# Patient Record
Sex: Male | Born: 1960
Health system: Southern US, Community
[De-identification: ages and names within clinical notes are randomized; demographics above are authoritative.]

## PROBLEM LIST (undated history)

## (undated) ENCOUNTER — Encounter: Attending: Pharmacist | Primary: Pharmacist

## (undated) ENCOUNTER — Encounter: Attending: Family | Primary: Family

## (undated) ENCOUNTER — Encounter
Attending: Student in an Organized Health Care Education/Training Program | Primary: Student in an Organized Health Care Education/Training Program

## (undated) ENCOUNTER — Encounter

## (undated) ENCOUNTER — Ambulatory Visit

## (undated) ENCOUNTER — Telehealth: Attending: Ambulatory Care | Primary: Ambulatory Care

## (undated) ENCOUNTER — Ambulatory Visit
Payer: PRIVATE HEALTH INSURANCE | Attending: Student in an Organized Health Care Education/Training Program | Primary: Student in an Organized Health Care Education/Training Program

## (undated) ENCOUNTER — Non-Acute Institutional Stay: Payer: PRIVATE HEALTH INSURANCE

## (undated) ENCOUNTER — Ambulatory Visit: Attending: Pharmacist | Primary: Pharmacist

## (undated) ENCOUNTER — Encounter: Attending: Ambulatory Care | Primary: Ambulatory Care

## (undated) ENCOUNTER — Ambulatory Visit: Payer: PRIVATE HEALTH INSURANCE

## (undated) ENCOUNTER — Telehealth

## (undated) DIAGNOSIS — I10 Essential (primary) hypertension: Secondary | ICD-10-CM

## (undated) DIAGNOSIS — M1990 Unspecified osteoarthritis, unspecified site: Secondary | ICD-10-CM

## (undated) DIAGNOSIS — I2699 Other pulmonary embolism without acute cor pulmonale: Secondary | ICD-10-CM

## (undated) HISTORY — PX: BICEPS TENDON REPAIR: SHX566

## (undated) HISTORY — PX: SHOULDER SURGERY: SHX246

---

## 2008-04-14 ENCOUNTER — Ambulatory Visit (HOSPITAL_BASED_OUTPATIENT_CLINIC_OR_DEPARTMENT_OTHER): Admission: RE | Admit: 2008-04-14 | Discharge: 2008-04-14 | Payer: Self-pay | Admitting: Orthopedic Surgery

## 2010-08-07 NOTE — Op Note (Signed)
NAME:  Tyler Mcclure, WALN NO.:  [id number]   MEDICAL RECORD NO.:  [id number]          PATIENT TYPE:  AMB   LOCATION:  [place]                          FACILITY:  [facility]   PHYSICIAN:  [name], M.D. DATE OF BIRTH:  [birth year]   DATE OF PROCEDURE:  04/14/2008  DATE OF DISCHARGE:                               OPERATIVE REPORT   PREOPERATIVE DIAGNOSIS:  Right shoulder impingement, distal clavicle  osteolysis.   POSTOPERATIVE DIAGNOSES:  Right shoulder impingement, distal clavicle  osteolysis, with full-thickness tears of supraspinatus tendon.   PROCEDURE:  Right shoulder exam under anesthesia, arthroscopy, with  rotator cuff debridement and arthroscopically assisted repair, utilizing  fiber weave suture x3, Swivel-Lock anchors x3, bursectomy and  acromioplasty, coracoacromial ligament release, excision of distal  clavicle.   SURGEON:  [name], MD   ASSISTANT:  Alyson Locket. Ricard Dillon, [place], present throughout the entire case  necessary for timely completion of procedure.   ANESTHESIA:  General.   BLOOD LOSS:  Minimal.   SPECIMENS:  None.   CULTURES:  None.   COMPLICATIONS:  None.   DRESSING:  Soft compression with shoulder immobilizer.   PROCEDURE:  The patient was brought to the operating room and placed on  the operating table in supine position.  After adequate anesthesia had  been obtained, right shoulder was examined.  Some very mild lesions at  the extreme motion.  Full motion is stable at the shoulder after gentle  manipulation.  Placed in beach-chair position on the shoulder  positioner, prepped and draped in the usual sterile fashion.  Three  portals; anterior, posterior, and lateral.  Shoulder entered with blunt  obturator.  Arthroscope introduced.  The shoulder distended and  inspected.  Functional full-thickness tear, supraspinatus tendon entire  crescent region.  A little bit of thin remaining tissue nonfunctional.  Debrided back to healthy  tissue.  Tuberosity was roughened to bleeding  bone.  A little partial tearing of the biceps as it exited the shoulder  debrided.  Not enough to warrant resection or tenodesis.  Articular  cartilage, capsule ligamentous structures, biceps, anchor, and labrum  all otherwise intact.  Coming from above, type III acromion and reactive  bursitis.  Bursa resected.  Acromioplasty to a type I acromion with  shaver and high-speed bur.  CA ligament released with cautery.  Grade 4  changes with marked spurring in [facility] joint.  Periarticular spurs and  lateral centimeter of clavicle resected with shaver and high-speed bur,  the adequacy of decompression and clavicle excision confirmed viewing  from all portals.  Lateral portal opened a little bit further for a  large cannula.  A smaller cannula placed at the anterior portal.  The  cup was then captured with 3 horizontal mattress sutures with the  Scorpion device and fiber weave suture.  The sutures then all firmly  anchored down into the tuberosity with pre punching and then secured  fixation with Swivel-Lock.  The front and back anchored down with Swivel-  Lock and then the very anterior portion was brought over top as a double  row  and anchored down a little bit more laterally.  At completion, nice,  firm, water-tight closure of the cuff without undue tension when viewed  arthroscopically through full passive motion.  Instruments were removed.  Portals of shoulder and bursa injected with Marcaine.  Portals closed  with 4-0 nylon.  Sterile compressive dressing applied.  Shoulder  immobilizer applied.  Anesthesia reversed.  Brought to recovery room.  Tolerated the surgery well.  No complications.      [name], M.D.  Electronically Signed     DFM/MEDQ  D:  04/14/2008  T:  04/15/2008  Job:  [id number]

## 2010-09-05 ENCOUNTER — Other Ambulatory Visit: Payer: Self-pay | Admitting: Orthopedic Surgery

## 2010-09-05 DIAGNOSIS — M25521 Pain in right elbow: Secondary | ICD-10-CM

## 2010-09-12 ENCOUNTER — Ambulatory Visit
Admission: RE | Admit: 2010-09-12 | Discharge: 2010-09-12 | Disposition: A | Discharge status: Home / Self Care | Payer: BC Managed Care – PPO | Source: Ambulatory Visit | Attending: Orthopedic Surgery | Admitting: Orthopedic Surgery

## 2010-09-12 DIAGNOSIS — M25521 Pain in right elbow: Secondary | ICD-10-CM

## 2010-11-14 ENCOUNTER — Ambulatory Visit (HOSPITAL_BASED_OUTPATIENT_CLINIC_OR_DEPARTMENT_OTHER)
Admission: RE | Admit: 2010-11-14 | Discharge: 2010-11-14 | Disposition: A | Discharge status: Home / Self Care | Payer: BC Managed Care – PPO | Source: Ambulatory Visit | Attending: Orthopedic Surgery | Admitting: Orthopedic Surgery

## 2010-11-14 DIAGNOSIS — M5412 Radiculopathy, cervical region: Secondary | ICD-10-CM | POA: Insufficient documentation

## 2010-11-14 DIAGNOSIS — M249 Joint derangement, unspecified: Secondary | ICD-10-CM | POA: Insufficient documentation

## 2010-11-14 DIAGNOSIS — Z01812 Encounter for preprocedural laboratory examination: Secondary | ICD-10-CM | POA: Insufficient documentation

## 2010-11-14 LAB — POCT HEMOGLOBIN-HEMACUE: Hemoglobin: 16.7 g/dL (ref 13.0–17.0)

## 2010-11-20 NOTE — Op Note (Signed)
NAME:  Tyler Mcclure, Tyler Mcclure                ACCOUNT NO.:  [id number]  MEDICAL RECORD NO.:  [id number]  LOCATION:                                 FACILITY:  PHYSICIAN:  Sheral Apley. Burney Gauze, M.D.   DATE OF BIRTH:  DATE OF PROCEDURE:  11/14/2010 DATE OF DISCHARGE:                              OPERATIVE REPORT   PREOPERATIVE DIAGNOSIS:  Rupture right biceps tendon distally and right radial tunnel syndrome.  POSTOPERATIVE DIAGNOSIS:  Rupture right biceps tendon distally and right radial tunnel syndrome.  PROCEDURE:  Primary repair of right biceps rupture and radial tunnel release.  SURGEON:  Sheral Apley. Burney Gauze, MD  ASSISTANT:  None.  ANESTHESIA:  General.  TOURNIQUET TIME:  1 hour and 18 minutes.  COMPLICATION:  None.  DRAINS:  None.  DESCRIPTION:  The patient was taken to the operating suite.  After induction of adequate general anesthesia, the right upper extremity was prepped and draped in sterile fashion.  An Esmarch was used to exsanguinate the limb and the tourniquet was inflated to 250 mmHg.  At this point in time, incision was made transversely in the antecubital fossa and extended laterally down the palpable border of the brachioradialis and mobile wad.  Skin was incised.  An ulnarly based flap was elevated.  Dissection was carried down to the antecubital fossa.  Large branches of cephalic vein were carefully identified and retracted.  The biceps tendon was seen and carefully traced down to its insertion onto the bicipital tuberosity.  This included a ligation of several large vessels with 0 Vicryl ties.  Once this was done, it was evident that the biceps had almost completely ruptured with some pseudo tendon at the insertion site.  This was carefully dissected free.  The tendon was then brought out into the operative field.  It was gently placed in a traction and once this was done it was sutured with the Arthrex Endo button suture which was a FiberWire suture in a  locked running stitch.  After this was completed, the insertion site on the bicipital tuberosity was identified fluoroscopically and a unicortical wire was driven across followed by creation of a bone tunnel accordingly.  Once this was done, the biceps tendon was then debrided distally of the pseudo  tendon and the freshened stump was then inserted into this hole using Endo button technique under the appropriate tension.  After this was completed, the wound was thoroughly irrigated. Hemostasis achieved with bipolar cautery.  The lateral aspect of the wound was then developed into the level of brachioradialis which was carefully split down to the level of the radial tunnel.  The arcade of broach was identified and carefully retracted.  Small branches were ligated.  The fibrinous edge of the supinator muscle was carefully released, thus taking pressure off the radial nerve.  This wound was also irrigated.  It was then closed in layers of 2-0 undyed Vicryl and 4-0 Vicryl Rapide sutures on the skin.  Xeroform, 4x4s fluffs and a splint with the elbow flexed to 90 degrees and the forearm in supination was applied.  The patient tolerated the procedure well and went to the recovery room in stable fashion.  Sheral Apley Burney Gauze, M.D.     MAW/MEDQ  D:  11/14/2010  T:  11/14/2010  Job:  [id number]  Electronically Signed by Charlotte Crumb M.D. on 11/20/2010 09:36:47 AM

## 2011-12-12 LAB — HM HEPATITIS C SCREENING LAB: HM HEPATITIS C SCREENING: NEGATIVE

## 2012-02-24 ENCOUNTER — Ambulatory Visit: Payer: Self-pay | Admitting: Gastroenterology

## 2014-11-25 ENCOUNTER — Encounter: Payer: Self-pay | Admitting: Family Medicine

## 2014-11-25 ENCOUNTER — Ambulatory Visit
Admission: RE | Admit: 2014-11-25 | Discharge: 2014-11-25 | Disposition: A | Discharge status: Home / Self Care | Payer: BLUE CROSS/BLUE SHIELD | Source: Ambulatory Visit | Attending: Family Medicine | Admitting: Family Medicine

## 2014-11-25 ENCOUNTER — Ambulatory Visit (INDEPENDENT_AMBULATORY_CARE_PROVIDER_SITE_OTHER): Payer: BLUE CROSS/BLUE SHIELD | Admitting: Family Medicine

## 2014-11-25 ENCOUNTER — Telehealth: Payer: Self-pay

## 2014-11-25 VITALS — BP 122/90 | HR 72 | Temp 97.9°F | Resp 16 | Wt 217.0 lb

## 2014-11-25 DIAGNOSIS — M79674 Pain in right toe(s): Secondary | ICD-10-CM | POA: Diagnosis not present

## 2014-11-25 DIAGNOSIS — S46119A Strain of muscle, fascia and tendon of long head of biceps, unspecified arm, initial encounter: Secondary | ICD-10-CM | POA: Insufficient documentation

## 2014-11-25 DIAGNOSIS — C4492 Squamous cell carcinoma of skin, unspecified: Secondary | ICD-10-CM | POA: Insufficient documentation

## 2014-11-25 DIAGNOSIS — M19071 Primary osteoarthritis, right ankle and foot: Secondary | ICD-10-CM | POA: Diagnosis not present

## 2014-11-25 DIAGNOSIS — R195 Other fecal abnormalities: Secondary | ICD-10-CM | POA: Insufficient documentation

## 2014-11-25 MED ORDER — NAPROXEN 500 MG PO TABS: 500.0000 mg | ORAL_TABLET | Freq: Two times a day (BID) | ORAL | Status: DC

## 2014-11-25 NOTE — Telephone Encounter (Signed)
-----   Message from Margo Common, [place] sent at 11/25/2014  2:34 PM EDT ----- Awaiting lab reports to rule out any gout. X-ray shows osteoarthritis of that great toe joint. The Naproxen 500 mg BID should help it settle down.

## 2014-11-25 NOTE — Telephone Encounter (Signed)
Patient advised as directed below.  Thanks,  -Joelle Flessner 

## 2014-11-25 NOTE — Progress Notes (Signed)
Patient ID: AKITO BOOMHOWER, male   DOB: Mar 07, 1961, 54 y.o.   MRN: [id number]   Patient: Tyler Mcclure Male    DOB: 04-22-1960   54 y.o.   MRN: [id number] Visit Date: 11/25/2014  Today's Provider: Vernie Murders, PA   Chief Complaint  Patient presents with  . Toe Pain    right great toe X 2 days   Subjective:    Toe Pain  The incident occurred 2 days ago. There was no injury mechanism. The pain is present in the right toes. The pain has been constant since onset. Associated symptoms include a loss of motion. He reports no foreign bodies present. He has tried nothing for the symptoms. The treatment provided no relief.   Patient Active Problem List   Diagnosis Date Noted  . Rupture of tendon of biceps, long head 11/25/2014  . Fecal occult blood test positive 11/25/2014  . Squamous cell carcinoma of skin 11/25/2014   Past Surgical History  Procedure Laterality Date  . Biceps tendon repair    . Shoulder surgery     Family History  Problem Relation Age of Onset  . Breast cancer Mother   . Hyperlipidemia Mother   . Pancreatic cancer Father   . Healthy Sister   . Healthy Daughter   . Healthy Son    Allergies  Allergen Reactions  . Mercury   . Penicillins      Previous Medications   CHOLECALCIFEROL (VITAMIN D) 2000 UNITS CAPS    Take 1 capsule by mouth daily.   HYDROCORTISONE (ANUSOL-HC) 2.5 % RECTAL CREAM    Place 1 application rectally at bedtime.   MULTIPLE VITAMIN PO    Take 1 tablet by mouth daily.   OMEGA-3 FATTY ACIDS (FISH OIL BURP-LESS) 1200 MG CAPS    Take 2 capsules by mouth daily.   VITAMIN E 400 UNIT CAPSULE    Take 1 capsule by mouth daily.    Review of Systems  Constitutional: Negative.   HENT: Negative.   Respiratory: Negative.   Musculoskeletal: Positive for arthralgias.       Right great toe.   Neurological: Negative.     Social History  Substance Use Topics  . Smoking status: Never Smoker   . Smokeless tobacco: Current User  . Alcohol Use: 0.0  oz/week    0 Standard drinks or equivalent per week     Comment: OCCASIONALLY   Objective:   BP 122/90 mmHg  Pulse 72  Temp(Src) 97.9 F (36.6 C) (Oral)  Resp 16  Wt 217 lb (98.431 kg)  Physical Exam  Constitutional: He is oriented to person, place, and time. He appears well-developed and well-nourished. No distress.  HENT:  Head: Normocephalic and atraumatic.  Right Ear: Hearing normal.  Left Ear: Hearing normal.  Nose: Nose normal.  Eyes: Conjunctivae and lids are normal. Right eye exhibits no discharge. Left eye exhibits no discharge. No scleral icterus.  Pulmonary/Chest: Effort normal. No respiratory distress.  Musculoskeletal:  Stiff and very tender right great toe. Pain with passive ROM but no redness or swelling. Pulses are 2+.  Neurological: He is alert and oriented to person, place, and time.  Skin: Skin is intact. No lesion and no rash noted.  Psychiatric: He has a normal mood and affect. His speech is normal and behavior is normal. Thought content normal.      Assessment & Plan:     1. Toe pain, right Onset 2 days ago without known injury. No fever, redness or  swelling. Some history of past arthralgias. No known family history of gout. Will treat with NSAID, get labs and x-ray evaluation to rule out gout versus degenerative arthritis. Recheck pending reports. - naproxen (NAPROSYN) 500 MG tablet; Take 1 tablet (500 mg total) by mouth 2 (two) times daily with a meal.  Dispense: 60 tablet; Refill: 0 - Uric acid - CBC with Differential/Platelet - DG Foot Complete Right

## 2014-11-26 LAB — CBC WITH DIFFERENTIAL/PLATELET
BASOS ABS: 0 10*3/uL (ref 0.0–0.2)
Basos: 1 %
EOS (ABSOLUTE): 0.1 10*3/uL (ref 0.0–0.4)
Eos: 2 %
HEMOGLOBIN: 16.4 g/dL (ref 12.6–17.7)
Hematocrit: 48 % (ref 37.5–51.0)
IMMATURE GRANS (ABS): 0 10*3/uL (ref 0.0–0.1)
Immature Granulocytes: 0 %
LYMPHS: 40 %
Lymphocytes Absolute: 2.5 10*3/uL (ref 0.7–3.1)
MCH: 30.1 pg (ref 26.6–33.0)
MCHC: 34.2 g/dL (ref 31.5–35.7)
MCV: 88 fL (ref 79–97)
MONOCYTES: 11 %
Monocytes Absolute: 0.7 10*3/uL (ref 0.1–0.9)
NEUTROS PCT: 46 %
Neutrophils Absolute: 2.9 10*3/uL (ref 1.4–7.0)
PLATELETS: 229 10*3/uL (ref 150–379)
RBC: 5.44 x10E6/uL (ref 4.14–5.80)
RDW: 13.1 % (ref 12.3–15.4)
WBC: 6.3 10*3/uL (ref 3.4–10.8)

## 2014-11-26 LAB — URIC ACID: URIC ACID: 7.1 mg/dL (ref 3.7–8.6)

## 2014-11-29 ENCOUNTER — Telehealth: Payer: Self-pay

## 2014-11-29 NOTE — Telephone Encounter (Signed)
-----   Message from Margo Common, [place] sent at 11/28/2014 11:25 AM EDT ----- CBC and uric acid level essentially normal. X-ray confirm osteoarthritis in the great toe joints. If persistent problem despite use of Naproxen, should consider referral to podiatrist in 2-3 weeks.

## 2014-11-29 NOTE — Telephone Encounter (Signed)
Patient advised as directed below.  Thanks,  -Mindy Behnken 

## 2014-12-21 ENCOUNTER — Ambulatory Visit (INDEPENDENT_AMBULATORY_CARE_PROVIDER_SITE_OTHER): Payer: BLUE CROSS/BLUE SHIELD | Admitting: Family Medicine

## 2014-12-21 ENCOUNTER — Encounter: Payer: Self-pay | Admitting: Family Medicine

## 2014-12-21 VITALS — BP 112/82 | HR 73 | Temp 98.1°F | Resp 18 | Ht 70.0 in | Wt 210.4 lb

## 2014-12-21 DIAGNOSIS — Z0000 Encounter for general adult medical examination without abnormal findings: Secondary | ICD-10-CM | POA: Diagnosis not present

## 2014-12-21 NOTE — Progress Notes (Signed)
Patient ID: Tyler Mcclure, male   DOB: [birth year], 54 y.o.   MRN: [id number] Patient: Tyler Mcclure, Male    DOB: [birth year], 54 y.o.   MRN: [id number] Visit Date: 12/21/2014  Today's Provider: [name], MD   Chief Complaint  Patient presents with  . Annual Exam   Subjective:  Tyler Mcclure is a 54 y.o. male who presents today for health maintenance and complete physical. He feels well. He reports exercising daily . He reports he is sleeping well (7-8 hours a night).  Last: EKG- 12/16/2013  Colonoscopy- 02/24/2012  Tdap-11/24/2008  PSA- 12/16/2013 (0.7)   Review of Systems  Constitutional: Negative.   HENT: Negative.   Eyes: Negative.   Respiratory: Negative.   Cardiovascular: Negative.   Gastrointestinal: Negative.   Endocrine: Negative.   Genitourinary: Negative.   Musculoskeletal: Negative.   Skin: Negative.   Allergic/Immunologic: Negative.   Neurological: Negative.   Hematological: Negative.   Psychiatric/Behavioral: Negative.     Social History   Social History  . Marital Status: Married    Spouse Name: N/A  . Number of Children: N/A  . Years of Education: N/A   Occupational History  . Not on file.   Social History Main Topics  . Smoking status: Never Smoker   . Smokeless tobacco: Current User  . Alcohol Use: 0.0 oz/week    0 Standard drinks or equivalent per week     Comment: OCCASIONALLY  . Drug Use: No  . Sexual Activity: Not on file   Other Topics Concern  . Not on file   Social History Narrative    Patient Active Problem List   Diagnosis Date Noted  . Rupture of tendon of biceps, long head 11/25/2014  . Fecal occult blood test positive 11/25/2014  . Squamous cell carcinoma of skin 11/25/2014    Past Surgical History  Procedure Laterality Date  . Biceps tendon repair    . Shoulder surgery      His family history includes Breast cancer in his mother; Healthy in his daughter, sister, and son; Hyperlipidemia in his mother;  Pancreatic cancer in his father.    Outpatient Prescriptions Prior to Visit  Medication Sig Dispense Refill  . Cholecalciferol (VITAMIN D) 2000 UNITS CAPS Take 1 capsule by mouth daily.    . hydrocortisone (ANUSOL-HC) 2.5 % rectal cream Place 1 application rectally at bedtime.    . MULTIPLE VITAMIN PO Take 1 tablet by mouth daily.    . naproxen (NAPROSYN) 500 MG tablet Take 1 tablet (500 mg total) by mouth 2 (two) times daily with a meal. 60 tablet 0  . Omega-3 Fatty Acids (FISH OIL BURP-LESS) 1200 MG CAPS Take 2 capsules by mouth daily.    . vitamin E 400 UNIT capsule Take 1 capsule by mouth daily.     No facility-administered medications prior to visit.    Patient Care Team: Jerrol Banana., MD as PCP - General (Family Medicine)     Objective:   Vitals:  Filed Vitals:   12/21/14 0827  BP: 112/82  Pulse: 73  Temp: 98.1 F (36.7 C)  TempSrc: Oral  Resp: 18  Height: 5' 10"  (1.778 m)  Weight: 210 lb 6.4 oz (95.437 kg)  SpO2: 97%    Physical Exam  Constitutional: He is oriented to person, place, and time. He appears well-developed and well-nourished.  HENT:  Head: Normocephalic and atraumatic.  Right Ear: External ear normal.  Left Ear: External ear normal.  Nose: Nose  normal.  Mouth/Throat: Oropharynx is clear and moist.  Eyes: Conjunctivae and EOM are normal. Pupils are equal, round, and reactive to light.  Neck: Normal range of motion. Neck supple.  Cardiovascular: Normal rate, regular rhythm, normal heart sounds and intact distal pulses.   Pulmonary/Chest: Effort normal and breath sounds normal.  Abdominal: Soft. Bowel sounds are normal.  Genitourinary: Rectum normal, prostate normal and penis normal.  Musculoskeletal: Normal range of motion.  Neurological: He is alert and oriented to person, place, and time.  Skin: Skin is warm and dry.  Psychiatric: He has a normal mood and affect. His behavior is normal. Judgment and thought content normal.      Depression Screen No flowsheet data found.    Assessment & Plan:     Routine Health Maintenance and Physical Exam  Exercise Activities and Dietary recommendations Goals    None      Immunization History  Administered Date(s) Administered  . Tdap 11/24/2008    Health Maintenance  Topic Date Due  . Hepatitis C Screening  Feb 20, 1961  . HIV Screening  04/08/1976  . COLONOSCOPY  04/09/2011  . INFLUENZA VACCINE  10/24/2014  . TETANUS/TDAP  11/25/2018      Discussed health benefits of physical activity, and encouraged him to engage in regular exercise appropriate for his age and condition.    ------------------------------------------------------------------------------------------------------------

## 2014-12-22 LAB — CBC WITH DIFFERENTIAL/PLATELET
Basophils Absolute: 0 10*3/uL (ref 0.0–0.2)
Basos: 1 %
EOS (ABSOLUTE): 0.1 10*3/uL (ref 0.0–0.4)
Eos: 2 %
Hematocrit: 47.9 % (ref 37.5–51.0)
Hemoglobin: 16.6 g/dL (ref 12.6–17.7)
IMMATURE GRANULOCYTES: 0 %
Immature Grans (Abs): 0 10*3/uL (ref 0.0–0.1)
LYMPHS ABS: 1.8 10*3/uL (ref 0.7–3.1)
Lymphs: 39 %
MCH: 30.5 pg (ref 26.6–33.0)
MCHC: 34.7 g/dL (ref 31.5–35.7)
MCV: 88 fL (ref 79–97)
MONOS ABS: 0.5 10*3/uL (ref 0.1–0.9)
Monocytes: 10 %
NEUTROS PCT: 48 %
Neutrophils Absolute: 2.2 10*3/uL (ref 1.4–7.0)
PLATELETS: 215 10*3/uL (ref 150–379)
RBC: 5.45 x10E6/uL (ref 4.14–5.80)
RDW: 12.9 % (ref 12.3–15.4)
WBC: 4.7 10*3/uL (ref 3.4–10.8)

## 2014-12-22 LAB — COMPREHENSIVE METABOLIC PANEL
A/G RATIO: 2.1 (ref 1.1–2.5)
ALT: 50 IU/L — AB (ref 0–44)
AST: 28 IU/L (ref 0–40)
Albumin: 5 g/dL (ref 3.5–5.5)
Alkaline Phosphatase: 41 IU/L (ref 39–117)
BUN/Creatinine Ratio: 14 (ref 9–20)
BUN: 12 mg/dL (ref 6–24)
Bilirubin Total: 1.6 mg/dL — ABNORMAL HIGH (ref 0.0–1.2)
CALCIUM: 9.6 mg/dL (ref 8.7–10.2)
CO2: 23 mmol/L (ref 18–29)
CREATININE: 0.86 mg/dL (ref 0.76–1.27)
Chloride: 102 mmol/L (ref 97–108)
GFR, EST AFRICAN AMERICAN: 114 mL/min/{1.73_m2} (ref >59)
GFR, EST NON AFRICAN AMERICAN: 99 mL/min/{1.73_m2} (ref >59)
Globulin, Total: 2.4 g/dL (ref 1.5–4.5)
Glucose: 89 mg/dL (ref 65–99)
POTASSIUM: 4.3 mmol/L (ref 3.5–5.2)
Sodium: 142 mmol/L (ref 134–144)
TOTAL PROTEIN: 7.4 g/dL (ref 6.0–8.5)

## 2014-12-22 LAB — LIPID PANEL
CHOL/HDL RATIO: 4.3 ratio (ref 0.0–5.0)
CHOLESTEROL TOTAL: 215 mg/dL — AB (ref 100–199)
HDL: 50 mg/dL (ref >39)
LDL CALC: 121 mg/dL — AB (ref 0–99)
TRIGLYCERIDES: 219 mg/dL — AB (ref 0–149)
VLDL Cholesterol Cal: 44 mg/dL — ABNORMAL HIGH (ref 5–40)

## 2014-12-22 LAB — TSH: TSH: 3.12 u[IU]/mL (ref 0.450–4.500)

## 2014-12-22 LAB — PSA: Prostate Specific Ag, Serum: 0.5 ng/mL (ref 0.0–4.0)

## 2015-03-30 ENCOUNTER — Other Ambulatory Visit: Payer: Self-pay

## 2015-04-20 ENCOUNTER — Encounter (HOSPITAL_BASED_OUTPATIENT_CLINIC_OR_DEPARTMENT_OTHER): Payer: Self-pay | Admitting: *Deleted

## 2015-04-24 ENCOUNTER — Other Ambulatory Visit: Payer: Self-pay | Admitting: Physician Assistant

## 2015-04-24 NOTE — H&P (Signed)
Tyler Mcclure is a pleasant 55 year old gentleman who presents to the clinic today for left shoulder pain. He states this has been ongoing for the better part of the past year. No known injury or change in activity although he is quite an avid weight lifter. Most of his pain is over the [facility] joint, trapezius and parascapular region. He describes this as a constant ache.  Worse with forward flexion internal rotation. This occasionally wakes him up at night, especially if he rolls on the affected side. He has tried to take Aleve with minimal relief of symptoms. No radicular symptoms noted. He had a previous injection to the left shoulder.   Of note he is status post right shoulder arthroscopic decompression rotator cuff repair by Dr. Amada Jupiter in 2010. This was from an attritional tear. He states his left shoulder feels very similar to his right shoulder prior to operative intervention.  Past Medical, Family and Social History reviewed in detail on patient questionnaire and signed.   Review of systems as detailed on HPI; all others reviewed and are negative.   EXAMINATION: Well-developed, well-nourished male in no acute distress.  Alert and oriented x 3.  Lungs clear to auscultation  Bilaterally.  Heart sounds normal.  Examination of left shoulder reveals forward flexion to 180 degrees. He can internally rotate to the back pocket. Negative empty can. Markedly positive cross body. Marked tenderness to palpation over the [facility] joint. X-RAYS: X-rays reveal a Type II acromion. Marked AC degenerative changes. No glenohumeral space narrowing.  No migration of the humeral head. IMPRESSION: Chronic impingement syndrome of the left shoulder versus rotator cuff tear.  PLAN: At this time we feel it is best appropriate to proceed with an MRI of the left shoulder to assess the pathology. Because this has been ongoing for quite some time we feel the only relief would be from an arthroscopic shoulder decompression and rotator  cuff repair if that is involved as well. We will be in touch with Tyler Mcclure over the phone once his MRI is complete. We have already gone ahead and filled out paperwork to proceed with a left shoulder arthroscopic decompression and possible rotator cuff repair.    [name], M.D.  Addendum: Recent MRI of the left shoulder reveals a small undersurface rotator cuff tear Dictated by Lynn Ito  Electronically verified by [name], M.D.

## 2015-04-26 NOTE — Interval H&P Note (Signed)
History and Physical Interval Note:  04/26/2015 8:35 PM  Tyler Mcclure  has presented today for surgery, with the diagnosis of Other articular cartilage disorders, left shoulder, primary ostoeoarthritis, left shoulder, bursitis of left shoulder, complete rotator cuff tear or rupture of left shoulder, not specified as traumatic  The various methods of treatment have been discussed with the patient and family. After consideration of risks, benefits and other options for treatment, the patient has consented to  Procedure(s): LEFT SHOULDER ARTHROSCOPY WITH DEBRIDEMENT, ACROMIOPLASTY, DISTAL CLAVICLE EXCISION, ROTATOR CUFF REPAIR (Left) as a surgical intervention .  The patient's history has been reviewed, patient examined, no change in status, stable for surgery.  I have reviewed the patient's chart and labs.  Questions were answered to the patient's satisfaction.     Tyler Mcclure

## 2015-04-27 ENCOUNTER — Encounter (HOSPITAL_BASED_OUTPATIENT_CLINIC_OR_DEPARTMENT_OTHER)
Admission: RE | Disposition: A | Discharge status: Home / Self Care | Payer: Self-pay | Source: Ambulatory Visit | Attending: Orthopedic Surgery

## 2015-04-27 ENCOUNTER — Ambulatory Visit (HOSPITAL_BASED_OUTPATIENT_CLINIC_OR_DEPARTMENT_OTHER): Payer: BLUE CROSS/BLUE SHIELD | Admitting: Certified Registered"

## 2015-04-27 ENCOUNTER — Encounter (HOSPITAL_BASED_OUTPATIENT_CLINIC_OR_DEPARTMENT_OTHER): Payer: Self-pay

## 2015-04-27 ENCOUNTER — Ambulatory Visit (HOSPITAL_BASED_OUTPATIENT_CLINIC_OR_DEPARTMENT_OTHER)
Admission: RE | Admit: 2015-04-27 | Discharge: 2015-04-27 | Disposition: A | Discharge status: Home / Self Care | Payer: BLUE CROSS/BLUE SHIELD | Source: Ambulatory Visit | Attending: Orthopedic Surgery | Admitting: Orthopedic Surgery

## 2015-04-27 DIAGNOSIS — M75102 Unspecified rotator cuff tear or rupture of left shoulder, not specified as traumatic: Secondary | ICD-10-CM | POA: Insufficient documentation

## 2015-04-27 DIAGNOSIS — M19012 Primary osteoarthritis, left shoulder: Secondary | ICD-10-CM | POA: Insufficient documentation

## 2015-04-27 DIAGNOSIS — M7542 Impingement syndrome of left shoulder: Secondary | ICD-10-CM | POA: Insufficient documentation

## 2015-04-27 HISTORY — PX: SHOULDER ARTHROSCOPY WITH ROTATOR CUFF REPAIR AND SUBACROMIAL DECOMPRESSION: SHX5686

## 2015-04-27 HISTORY — DX: Unspecified osteoarthritis, unspecified site: M19.90

## 2015-04-27 HISTORY — PX: RESECTION DISTAL CLAVICAL: SHX5053

## 2015-04-27 SURGERY — SHOULDER ARTHROSCOPY WITH ROTATOR CUFF REPAIR AND SUBACROMIAL DECOMPRESSION
Anesthesia: Regional | Site: Shoulder | Laterality: Left

## 2015-04-27 MED ORDER — FENTANYL CITRATE (PF) 100 MCG/2ML IJ SOLN
50.0000 ug | INTRAMUSCULAR | Status: DC | PRN
  Administered 2015-04-27 (×2): 50 ug via INTRAVENOUS

## 2015-04-27 MED ORDER — FENTANYL CITRATE (PF) 100 MCG/2ML IJ SOLN
INTRAMUSCULAR | Status: AC
  Filled 2015-04-27: qty 2

## 2015-04-27 MED ORDER — CHLORHEXIDINE GLUCONATE 4 % EX LIQD: 60.0000 mL | Freq: Once | CUTANEOUS | Status: DC

## 2015-04-27 MED ORDER — HYDROMORPHONE HCL 1 MG/ML IJ SOLN: 0.2500 mg | INTRAMUSCULAR | Status: DC | PRN

## 2015-04-27 MED ORDER — CLINDAMYCIN PHOSPHATE 900 MG/50ML IV SOLN
INTRAVENOUS | Status: AC
  Filled 2015-04-27: qty 50

## 2015-04-27 MED ORDER — KETOROLAC TROMETHAMINE 30 MG/ML IJ SOLN: 30.0000 mg | Freq: Once | INTRAMUSCULAR | Status: DC

## 2015-04-27 MED ORDER — PROMETHAZINE HCL 25 MG/ML IJ SOLN: 6.2500 mg | INTRAMUSCULAR | Status: DC | PRN

## 2015-04-27 MED ORDER — SUCCINYLCHOLINE CHLORIDE 20 MG/ML IJ SOLN
INTRAMUSCULAR | Status: AC
  Filled 2015-04-27: qty 1

## 2015-04-27 MED ORDER — LIDOCAINE HCL (CARDIAC) 20 MG/ML IV SOLN
INTRAVENOUS | Status: AC
Start: 2015-04-27 — End: 2015-04-27
  Filled 2015-04-27: qty 5

## 2015-04-27 MED ORDER — GLYCOPYRROLATE 0.2 MG/ML IJ SOLN: 0.2000 mg | Freq: Once | INTRAMUSCULAR | Status: DC | PRN

## 2015-04-27 MED ORDER — OXYCODONE-ACETAMINOPHEN 5-325 MG PO TABS: 1.0000 | ORAL_TABLET | ORAL | Status: DC | PRN

## 2015-04-27 MED ORDER — ONDANSETRON HCL 4 MG/2ML IJ SOLN
INTRAMUSCULAR | Status: DC | PRN
  Administered 2015-04-27: 4 mg via INTRAVENOUS

## 2015-04-27 MED ORDER — ONDANSETRON HCL 4 MG PO TABS: 4.0000 mg | ORAL_TABLET | Freq: Three times a day (TID) | ORAL | Status: DC | PRN

## 2015-04-27 MED ORDER — CLINDAMYCIN PHOSPHATE 900 MG/50ML IV SOLN
900.0000 mg | INTRAVENOUS | Status: AC
  Administered 2015-04-27: 900 mg via INTRAVENOUS

## 2015-04-27 MED ORDER — ONDANSETRON HCL 4 MG/2ML IJ SOLN
INTRAMUSCULAR | Status: AC
  Filled 2015-04-27: qty 2

## 2015-04-27 MED ORDER — MIDAZOLAM HCL 2 MG/2ML IJ SOLN
INTRAMUSCULAR | Status: AC
  Filled 2015-04-27: qty 2

## 2015-04-27 MED ORDER — PROPOFOL 10 MG/ML IV BOLUS
INTRAVENOUS | Status: DC | PRN
  Administered 2015-04-27: 200 mg via INTRAVENOUS

## 2015-04-27 MED ORDER — OXYCODONE HCL 5 MG PO TABS: 5.0000 mg | ORAL_TABLET | Freq: Once | ORAL | Status: DC | PRN

## 2015-04-27 MED ORDER — PROPOFOL 500 MG/50ML IV EMUL
INTRAVENOUS | Status: AC
  Filled 2015-04-27: qty 50

## 2015-04-27 MED ORDER — DEXAMETHASONE SODIUM PHOSPHATE 4 MG/ML IJ SOLN
INTRAMUSCULAR | Status: DC | PRN
Start: 2015-04-27 — End: 2015-04-27
  Administered 2015-04-27: 10 mg via INTRAVENOUS

## 2015-04-27 MED ORDER — LACTATED RINGERS IV SOLN: INTRAVENOUS | Status: DC

## 2015-04-27 MED ORDER — LIDOCAINE HCL (CARDIAC) 20 MG/ML IV SOLN
INTRAVENOUS | Status: DC | PRN
  Administered 2015-04-27: 30 mg via INTRAVENOUS

## 2015-04-27 MED ORDER — SODIUM CHLORIDE 0.9 % IR SOLN
Status: DC | PRN
  Administered 2015-04-27: 11000 mL

## 2015-04-27 MED ORDER — OXYCODONE HCL 5 MG/5ML PO SOLN: 5.0000 mg | Freq: Once | ORAL | Status: DC | PRN

## 2015-04-27 MED ORDER — MIDAZOLAM HCL 2 MG/2ML IJ SOLN
1.0000 mg | INTRAMUSCULAR | Status: DC | PRN
  Administered 2015-04-27: 2 mg via INTRAVENOUS
  Administered 2015-04-27: 1 mg via INTRAVENOUS

## 2015-04-27 MED ORDER — DEXAMETHASONE SODIUM PHOSPHATE 10 MG/ML IJ SOLN
INTRAMUSCULAR | Status: AC
  Filled 2015-04-27: qty 1

## 2015-04-27 MED ORDER — BUPIVACAINE-EPINEPHRINE (PF) 0.5% -1:200000 IJ SOLN
INTRAMUSCULAR | Status: DC | PRN
  Administered 2015-04-27: 30 mL via PERINEURAL

## 2015-04-27 MED ORDER — LACTATED RINGERS IV SOLN
INTRAVENOUS | Status: DC
Start: 2015-04-27 — End: 2015-04-27
  Administered 2015-04-27 (×2): via INTRAVENOUS

## 2015-04-27 MED ORDER — SCOPOLAMINE 1 MG/3DAYS TD PT72: 1.0000 | MEDICATED_PATCH | Freq: Once | TRANSDERMAL | Status: DC | PRN

## 2015-04-27 MED ORDER — SUCCINYLCHOLINE CHLORIDE 20 MG/ML IJ SOLN
INTRAMUSCULAR | Status: DC | PRN
  Administered 2015-04-27: 100 mg via INTRAVENOUS

## 2015-04-27 SURGICAL SUPPLY — 71 items
ANCHOR SUT BIO SW 4.75X19.1 (Anchor) ×4 IMPLANT
BENZOIN TINCTURE PRP APPL 2/3 (GAUZE/BANDAGES/DRESSINGS) IMPLANT
BLADE CUTTER GATOR 3.5 (BLADE) ×2 IMPLANT
BLADE CUTTER MENIS 5.5 (BLADE) IMPLANT
BLADE GREAT WHITE 4.2 (BLADE) ×2 IMPLANT
BLADE SURG 15 STRL LF DISP TIS (BLADE) IMPLANT
BLADE SURG 15 STRL SS (BLADE)
BUR OVAL 6.0 (BURR) ×2 IMPLANT
CANNULA DRY DOC 8X75 (CANNULA) ×2 IMPLANT
CANNULA TWIST IN 8.25X7CM (CANNULA) IMPLANT
DECANTER SPIKE VIAL GLASS SM (MISCELLANEOUS) IMPLANT
DRAPE OEC MINIVIEW 54X84 (DRAPES) IMPLANT
DRAPE STERI 35X30 U-POUCH (DRAPES) ×2 IMPLANT
DRAPE U-SHAPE 47X51 STRL (DRAPES) ×2 IMPLANT
DRAPE U-SHAPE 76X120 STRL (DRAPES) ×4 IMPLANT
DRSG PAD ABDOMINAL 8X10 ST (GAUZE/BANDAGES/DRESSINGS) ×2 IMPLANT
DURAPREP 26ML APPLICATOR (WOUND CARE) ×2 IMPLANT
ELECT MENISCUS 165MM 90D (ELECTRODE) ×2 IMPLANT
ELECT REM PT RETURN 9FT ADLT (ELECTROSURGICAL) ×2
ELECTRODE REM PT RTRN 9FT ADLT (ELECTROSURGICAL) ×1 IMPLANT
GAUZE SPONGE 4X4 12PLY STRL (GAUZE/BANDAGES/DRESSINGS) ×4 IMPLANT
GAUZE XEROFORM 1X8 LF (GAUZE/BANDAGES/DRESSINGS) ×2 IMPLANT
GLOVE BIOGEL PI IND STRL 7.0 (GLOVE) ×3 IMPLANT
GLOVE BIOGEL PI INDICATOR 7.0 (GLOVE) ×3
GLOVE ECLIPSE 6.5 STRL STRAW (GLOVE) ×2 IMPLANT
GLOVE ECLIPSE 7.0 STRL STRAW (GLOVE) ×2 IMPLANT
GLOVE SURG ORTHO 8.0 STRL STRW (GLOVE) ×2 IMPLANT
GOWN STRL REUS W/ TWL LRG LVL3 (GOWN DISPOSABLE) ×2 IMPLANT
GOWN STRL REUS W/ TWL XL LVL3 (GOWN DISPOSABLE) ×1 IMPLANT
GOWN STRL REUS W/TWL LRG LVL3 (GOWN DISPOSABLE) ×2
GOWN STRL REUS W/TWL XL LVL3 (GOWN DISPOSABLE) ×1
IV NS IRRIG 3000ML ARTHROMATIC (IV SOLUTION) ×2 IMPLANT
IV SOD CHL 0.9% 1000ML (IV SOLUTION) ×18 IMPLANT
MANIFOLD NEPTUNE II (INSTRUMENTS) ×2 IMPLANT
NDL SUT 6 .5 CRC .975X.05 MAYO (NEEDLE) IMPLANT
NEEDLE MAYO TAPER (NEEDLE)
NEEDLE SCORPION MULTI FIRE (NEEDLE) ×2 IMPLANT
NS IRRIG 1000ML POUR BTL (IV SOLUTION) IMPLANT
PACK ARTHROSCOPY DSU (CUSTOM PROCEDURE TRAY) ×2 IMPLANT
PACK BASIN DAY SURGERY FS (CUSTOM PROCEDURE TRAY) ×2 IMPLANT
PASSER SUT SWANSON 36MM LOOP (INSTRUMENTS) IMPLANT
PENCIL BUTTON HOLSTER BLD 10FT (ELECTRODE) ×2 IMPLANT
SET ARTHROSCOPY TUBING (MISCELLANEOUS) ×1
SET ARTHROSCOPY TUBING LN (MISCELLANEOUS) ×1 IMPLANT
SLEEVE SCD COMPRESS KNEE MED (MISCELLANEOUS) ×2 IMPLANT
SLING ARM FOAM STRAP LRG (SOFTGOODS) IMPLANT
SLING ARM IMMOBILIZER LRG (SOFTGOODS) ×2 IMPLANT
SLING ARM IMMOBILIZER MED (SOFTGOODS) IMPLANT
SLING ARM MED ADULT FOAM STRAP (SOFTGOODS) IMPLANT
SLING ARM XL FOAM STRAP (SOFTGOODS) IMPLANT
SPONGE LAP 4X18 X RAY DECT (DISPOSABLE) IMPLANT
STRIP CLOSURE SKIN 1/2X4 (GAUZE/BANDAGES/DRESSINGS) IMPLANT
SUCTION FRAZIER HANDLE 10FR (MISCELLANEOUS)
SUCTION TUBE FRAZIER 10FR DISP (MISCELLANEOUS) IMPLANT
SUT ETHIBOND 2 OS 4 DA (SUTURE) IMPLANT
SUT ETHILON 2 0 FS 18 (SUTURE) IMPLANT
SUT ETHILON 3 0 PS 1 (SUTURE) ×2 IMPLANT
SUT FIBERWIRE #2 38 T-5 BLUE (SUTURE)
SUT RETRIEVER MED (INSTRUMENTS) IMPLANT
SUT TIGER TAPE 7 IN WHITE (SUTURE) ×4 IMPLANT
SUT VIC AB 0 CT1 27 (SUTURE)
SUT VIC AB 0 CT1 27XBRD ANBCTR (SUTURE) IMPLANT
SUT VIC AB 2-0 SH 27 (SUTURE)
SUT VIC AB 2-0 SH 27XBRD (SUTURE) IMPLANT
SUT VIC AB 3-0 FS2 27 (SUTURE) IMPLANT
SUTURE FIBERWR #2 38 T-5 BLUE (SUTURE) IMPLANT
TAPE FIBER 2MM 7IN #2 BLUE (SUTURE) ×4 IMPLANT
TOWEL OR 17X24 6PK STRL BLUE (TOWEL DISPOSABLE) ×2 IMPLANT
TOWEL OR NON WOVEN STRL DISP B (DISPOSABLE) IMPLANT
WATER STERILE IRR 1000ML POUR (IV SOLUTION) ×2 IMPLANT
YANKAUER SUCT BULB TIP NO VENT (SUCTIONS) IMPLANT

## 2015-04-27 NOTE — Anesthesia Procedure Notes (Addendum)
Anesthesia Regional Block:  Interscalene brachial plexus block  Pre-Anesthetic Checklist: ,, timeout performed, Correct Patient, Correct Site, Correct Laterality, Correct Procedure, Correct Position, site marked, Risks and benefits discussed,  Surgical consent,  Pre-op evaluation,  At surgeon's request and post-op pain management  Laterality: Left  Prep: chloraprep       Needles:  Injection technique: Single-shot  Needle Type: Echogenic Stimulator Needle     Needle Length: 9cm 9 cm Needle Gauge: 21 and 21 G    Additional Needles:  Procedures: ultrasound guided (picture in chart) and nerve stimulator Interscalene brachial plexus block  Nerve Stimulator or Paresthesia:  Response: deltoid, 0.5 mA,   Additional Responses:   Narrative:  Injection made incrementally with aspirations every 5 mL.  Performed by: Personally  Anesthesiologist: Suzette Battiest   Procedure Name: Intubation Date/Time: 04/27/2015 8:05 AM Performed by: Kohlton Gilpatrick D Pre-anesthesia Checklist: Patient identified, Emergency Drugs available, Suction available and Patient being monitored Patient Re-evaluated:Patient Re-evaluated prior to inductionOxygen Delivery Method: Circle System Utilized Preoxygenation: Pre-oxygenation with 100% oxygen Intubation Type: IV induction Ventilation: Mask ventilation without difficulty Laryngoscope Size: Mac and 4 Grade View: Grade II Tube type: Oral Tube size: 7.0 mm Number of attempts: 1 Airway Equipment and Method: Stylet and Oral airway Placement Confirmation: ETT inserted through vocal cords under direct vision,  positive ETCO2 and breath sounds checked- equal and bilateral Secured at: 22 cm Tube secured with: Tape Dental Injury: Teeth and Oropharynx as per pre-operative assessment

## 2015-04-27 NOTE — Anesthesia Postprocedure Evaluation (Signed)
Anesthesia Post Note  Patient: Tyler Mcclure  Procedure(s) Performed: Procedure(s) (LRB): LEFT SHOULDER ARTHROSCOPY WITH DEBRIDEMENT, ACROMIOPLASTY, ROTATOR CUFF REPAIR (Left) DISTAL CLAVICLE EXCISION (Left)  Patient location during evaluation: PACU Anesthesia Type: General and Regional Level of consciousness: awake and alert Pain management: pain level controlled Vital Signs Assessment: post-procedure vital signs reviewed and stable Respiratory status: spontaneous breathing Cardiovascular status: blood pressure returned to baseline Anesthetic complications: no    Last Vitals:  Filed Vitals:   04/27/15 0945 04/27/15 1015  BP:  144/90  Pulse: 78 84  Temp:  36.4 C  Resp: 11 16    Last Pain:  Filed Vitals:   04/27/15 1033  PainSc: 0-No pain                 Tiajuana Amass

## 2015-04-27 NOTE — Transfer of Care (Signed)
Immediate Anesthesia Transfer of Care Note  Patient: Tyler Mcclure  Procedure(s) Performed: Procedure(s): LEFT SHOULDER ARTHROSCOPY WITH DEBRIDEMENT, ACROMIOPLASTY, ROTATOR CUFF REPAIR (Left) DISTAL CLAVICLE EXCISION (Left)  Patient Location: PACU  Anesthesia Type:GA combined with regional for post-op pain  Level of Consciousness: awake, alert , oriented and patient cooperative  Airway & Oxygen Therapy: Patient Spontanous Breathing and Patient connected to face mask oxygen  Post-op Assessment: Report given to RN and Post -op Vital signs reviewed and stable  Post vital signs: Reviewed and stable  Last Vitals:  Filed Vitals:   04/27/15 0730 04/27/15 0745  BP: 149/84 143/87  Pulse: 85 83  Temp:    Resp: 15 14    Complications: No apparent anesthesia complications

## 2015-04-27 NOTE — Progress Notes (Signed)
Assisted Dr. Rodman Comp with left, ultrasound guided, interscalene  block. Side rails up, monitors on throughout procedure. See vital signs in flow sheet. Tolerated Procedure well.

## 2015-04-27 NOTE — Discharge Instructions (Signed)
Shouder arthroscopy, rotator cuff repair, subacromial decompression Care After Instructions Refer to this sheet in the next few weeks. These discharge instructions provide you with general information on caring for yourself after you leave the hospital. Your caregiver may also give you specific instructions. Your treatment has been planned according to the most current medical practices available, but unavoidable complications sometimes occur. If you have any problems or questions after discharge, please call your caregiver. HOME INSTRUCTIONS You may resume a normal diet and activities as directed.  Take showers instead of baths until informed otherwise.  Change bandages (dressings) in 3 days.  Swab wounds daily with betadine.  Wash shoulder with soap and water.  Pat dry.  Cover wounds with bandaids. Only take over-the-counter or prescription medicines for pain, discomfort, or fever as directed by your caregiver.  Wear your sling for the next 6 weeks unless otherwise instructed. Eat a well-balanced diet.  Avoid lifting or driving until you are instructed otherwise.  Make an appointment to see your caregiver for stitches (suture) or staple removal one week after surgery.   SEEK MEDICAL CARE IF: You have swelling of your calf or leg.  You develop shortness of breath or chest pain.  You have redness, swelling, or increasing pain in the wound.  There is pus or any unusual drainage coming from the surgical site.  You notice a bad smell coming from the surgical site or dressing.  The surgical site breaks open after sutures or staples have been removed.  There is persistent bleeding from the suture or staple line.  You are getting worse or are not improving.  You have any other questions or concerns.  SEEK IMMEDIATE MEDICAL CARE IF:  You have a fever greater than 101 You develop a rash.  You have difficulty breathing.  You develop any reaction or side effects to medicines given.  Your knee  motion is decreasing rather than improving.  MAKE SURE YOU:  Understand these instructions.  Will watch your condition.  Will get help right away if you are not doing well or get worse.     Regional Anesthesia Blocks  1. Numbness or the inability to move the "blocked" extremity may last from 3-48 hours after placement. The length of time depends on the medication injected and your individual response to the medication. If the numbness is not going away after 48 hours, call your surgeon.  2. The extremity that is blocked will need to be protected until the numbness is gone and the  Strength has returned. Because you cannot feel it, you will need to take extra care to avoid injury. Because it may be weak, you may have difficulty moving it or using it. You may not know what position it is in without looking at it while the block is in effect.  3. For blocks in the legs and feet, returning to weight bearing and walking needs to be done carefully. You will need to wait until the numbness is entirely gone and the strength has returned. You should be able to move your leg and foot normally before you try and bear weight or walk. You will need someone to be with you when you first try to ensure you do not fall and possibly risk injury.  4. Bruising and tenderness at the needle site are common side effects and will resolve in a few days.  5. Persistent numbness or new problems with movement should be communicated to the surgeon or the [place] (  [phone])/ [place] [phone]).       Post Anesthesia Home Care Instructions  Activity: Get plenty of rest for the remainder of the day. A responsible adult should stay with you for 24 hours following the procedure.  For the next 24 hours, DO NOT: -Drive a car -Paediatric nurse -Drink alcoholic beverages -Take any medication unless instructed by your physician -Make any legal decisions or sign important  papers.  Meals: Start with liquid foods such as gelatin or soup. Progress to regular foods as tolerated. Avoid greasy, spicy, heavy foods. If nausea and/or vomiting occur, drink only clear liquids until the nausea and/or vomiting subsides. Call your physician if vomiting continues.  Special Instructions/Symptoms: Your throat may feel dry or sore from the anesthesia or the breathing tube placed in your throat during surgery. If this causes discomfort, gargle with warm salt water. The discomfort should disappear within 24 hours.  If you had a scopolamine patch placed behind your ear for the management of post- operative nausea and/or vomiting:  1. The medication in the patch is effective for 72 hours, after which it should be removed.  Wrap patch in a tissue and discard in the trash. Wash hands thoroughly with soap and water. 2. You may remove the patch earlier than 72 hours if you experience unpleasant side effects which may include dry mouth, dizziness or visual disturbances. 3. Avoid touching the patch. Wash your hands with soap and water after contact with the patch.

## 2015-04-27 NOTE — Interval H&P Note (Signed)
History and Physical Interval Note:  04/27/2015 12:23 PM  Tyler Mcclure  has presented today for surgery, with the diagnosis of Other articular cartilage disorders, left shoulder, primary ostoeoarthritis, left shoulder, bursitis of left shoulder, complete rotator cuff tear or rupture of left shoulder, not specified as traumatic  The various methods of treatment have been discussed with the patient and family. After consideration of risks, benefits and other options for treatment, the patient has consented to  Procedure(s): LEFT SHOULDER ARTHROSCOPY WITH DEBRIDEMENT, ACROMIOPLASTY, ROTATOR CUFF REPAIR (Left) DISTAL CLAVICLE EXCISION (Left) as a surgical intervention .  The patient's history has been reviewed, patient examined, no change in status, stable for surgery.  I have reviewed the patient's chart and labs.  Questions were answered to the patient's satisfaction.     [name]

## 2015-04-27 NOTE — Anesthesia Preprocedure Evaluation (Addendum)
Anesthesia Evaluation  Patient identified by MRN, date of birth, ID band Patient awake    Reviewed: Allergy & Precautions, NPO status , Patient's Chart, lab work & pertinent test results  Airway Mallampati: II  TM Distance: >3 FB Neck ROM: Full    Dental  (+) Dental Advisory Given   Pulmonary neg pulmonary ROS,    breath sounds clear to auscultation       Cardiovascular negative cardio ROS   Rhythm:Regular Rate:Normal     Neuro/Psych negative neurological ROS     GI/Hepatic negative GI ROS, Neg liver ROS,   Endo/Other  negative endocrine ROS  Renal/GU negative Renal ROS     Musculoskeletal  (+) Arthritis ,   Abdominal   Peds  Hematology negative hematology ROS (+)   Anesthesia Other Findings   Reproductive/Obstetrics                            Anesthesia Physical Anesthesia Plan  ASA: I  Anesthesia Plan: General and Regional   Post-op Pain Management: GA combined w/ Regional for post-op pain   Induction: Intravenous  Airway Management Planned: Oral ETT  Additional Equipment:   Intra-op Plan:   Post-operative Plan: Extubation in OR  Informed Consent: I have reviewed the patients History and Physical, chart, labs and discussed the procedure including the risks, benefits and alternatives for the proposed anesthesia with the patient or authorized representative who has indicated his/her understanding and acceptance.   Dental advisory given  Plan Discussed with: CRNA  Anesthesia Plan Comments:         Anesthesia Quick Evaluation

## 2015-04-28 ENCOUNTER — Encounter (HOSPITAL_BASED_OUTPATIENT_CLINIC_OR_DEPARTMENT_OTHER): Payer: Self-pay | Admitting: Orthopedic Surgery

## 2015-04-28 NOTE — Op Note (Signed)
NAME:  Tyler Mcclure, Tyler Mcclure NO.:  MEDICAL RECORD NO.:  [id number]  LOCATION:                                 FACILITY:  PHYSICIAN:  [name], M.D. DATE OF BIRTH:  06-19-1960  DATE OF PROCEDURE:  04/27/2015 DATE OF DISCHARGE:                              OPERATIVE REPORT   PREOPERATIVE DIAGNOSES:  Left shoulder longstanding impingement.  Marked degenerative joint disease, acromioclavicular joint.  At least partial tearing of rotator cuff, supraspinatus tendon.  POSTOPERATIVE DIAGNOSES:  Left shoulder longstanding impingement. Marked degenerative joint disease, acromioclavicular joint.  At least partial tearing of rotator cuff, supraspinatus tendon with full- thickness tear, most of the supraspinatus crescent region.  Inferior labral tear.  PROCEDURES:  Left shoulder examination under anesthesia, arthroscopy. Debridement of labrum.  Debridement and mobilization of rotator cuff. Bursectomy, acromioplasty, coracoacromial ligament release.  Excision of distal clavicle.  Arthroscopic-assisted rotator cuff repair, FiberWire suture x2, SwiveLock anchors x2.  SURGEON:  [name], M.D.  ASSISTANT:  Elmyra Ricks, PA, present throughout the entire case and necessary for timely completion of procedure.  ANESTHESIA:  General.  BLOOD LOSS:  Minimal.  SPECIMENS:  None.  CULTURES:  None.  COMPLICATIONS:  None.  DRESSINGS:  Soft compressive shoulder immobilizer.  DESCRIPTION OF PROCEDURE:  The patient was brought to the operating room, placed on the operating table in supine position.  After adequate anesthesia had been obtained, placed in a beach-chair position on the shoulder positioner, prepped and draped in usual sterile fashion. Steep, but full motion stable shoulder.  Three portals; anterior, posterior and lateral.  Arthroscope was introduced, shoulder was distended and inspected.  Glenohumeral joint did not look bad in regard to wear.   Complex tearing, anterior inferior labrum debrided to a stable surface.  Biceps tendon and biceps anchor intact.  Full-thickness tear of supraspinatus tendon.  Debrided, mobilized above and below.  Cannula redirected subacromially.  Type 3 acromion.  Acromioplasty to a type 1 acromion with shaver and a high-speed bur.  CA ligament released.  Grade 4 changes, distal clavicle with marked spurring.  Periarticular spurs, lateral centimeter of clavicle resected.  With the lateral cannula, the cuff was captured with horizontal mattress suture x2.  Firmly anchored down the tuberosity, which had been roughened with two SwiveLock anchors, prepunched.  At completion, nice, firm, watertight closure. Adequacy of decompression confirmed.  Instruments and fluid were removed.  Portals were closed with nylon.  Sterile compressive dressing applied.  Shoulder immobilizer applied.  Anesthesia reversed.  Brought to the recovery room, tolerated the surgery well.  No complications.     [name], M.D.     DFM/MEDQ  D:  04/27/2015  T:  04/27/2015  Job:  [id number]

## 2015-05-24 ENCOUNTER — Emergency Department: Payer: BLUE CROSS/BLUE SHIELD

## 2015-05-24 ENCOUNTER — Observation Stay
Admission: EM | Admit: 2015-05-24 | Discharge: 2015-05-25 | Disposition: A | Discharge status: Home / Self Care | Payer: BLUE CROSS/BLUE SHIELD | Attending: Internal Medicine | Admitting: Internal Medicine

## 2015-05-24 DIAGNOSIS — I2699 Other pulmonary embolism without acute cor pulmonale: Secondary | ICD-10-CM | POA: Diagnosis present

## 2015-05-24 DIAGNOSIS — Z880 Allergy status to penicillin: Secondary | ICD-10-CM | POA: Insufficient documentation

## 2015-05-24 DIAGNOSIS — M1990 Unspecified osteoarthritis, unspecified site: Secondary | ICD-10-CM | POA: Diagnosis present

## 2015-05-24 DIAGNOSIS — Z8249 Family history of ischemic heart disease and other diseases of the circulatory system: Secondary | ICD-10-CM | POA: Diagnosis not present

## 2015-05-24 DIAGNOSIS — Z800 Family history of malignant neoplasm of digestive organs: Secondary | ICD-10-CM | POA: Diagnosis not present

## 2015-05-24 DIAGNOSIS — Z79899 Other long term (current) drug therapy: Secondary | ICD-10-CM | POA: Diagnosis not present

## 2015-05-24 DIAGNOSIS — Z888 Allergy status to other drugs, medicaments and biological substances status: Secondary | ICD-10-CM | POA: Insufficient documentation

## 2015-05-24 DIAGNOSIS — X58XXXD Exposure to other specified factors, subsequent encounter: Secondary | ICD-10-CM | POA: Diagnosis not present

## 2015-05-24 DIAGNOSIS — M549 Dorsalgia, unspecified: Secondary | ICD-10-CM | POA: Diagnosis not present

## 2015-05-24 DIAGNOSIS — S46119A Strain of muscle, fascia and tendon of long head of biceps, unspecified arm, initial encounter: Secondary | ICD-10-CM | POA: Diagnosis present

## 2015-05-24 DIAGNOSIS — Z803 Family history of malignant neoplasm of breast: Secondary | ICD-10-CM | POA: Diagnosis not present

## 2015-05-24 DIAGNOSIS — S46012D Strain of muscle(s) and tendon(s) of the rotator cuff of left shoulder, subsequent encounter: Secondary | ICD-10-CM | POA: Diagnosis not present

## 2015-05-24 LAB — CBC
HEMATOCRIT: 48.2 % (ref 40.0–52.0)
Hemoglobin: 16.6 g/dL (ref 13.0–18.0)
MCH: 29.8 pg (ref 26.0–34.0)
MCHC: 34.4 g/dL (ref 32.0–36.0)
MCV: 86.6 fL (ref 80.0–100.0)
PLATELETS: 235 10*3/uL (ref 150–440)
RBC: 5.56 MIL/uL (ref 4.40–5.90)
RDW: 12.9 % (ref 11.5–14.5)
WBC: 11 10*3/uL — AB (ref 3.8–10.6)

## 2015-05-24 LAB — BASIC METABOLIC PANEL
ANION GAP: 10 (ref 5–15)
BUN: 15 mg/dL (ref 6–20)
CALCIUM: 9.4 mg/dL (ref 8.9–10.3)
CO2: 25 mmol/L (ref 22–32)
Chloride: 105 mmol/L (ref 101–111)
Creatinine, Ser: 0.96 mg/dL (ref 0.61–1.24)
Glucose, Bld: 112 mg/dL — ABNORMAL HIGH (ref 65–99)
Potassium: 3.9 mmol/L (ref 3.5–5.1)
Sodium: 140 mmol/L (ref 135–145)

## 2015-05-24 LAB — PROTIME-INR
INR: 0.97
PROTHROMBIN TIME: 13.1 s (ref 11.4–15.0)

## 2015-05-24 LAB — APTT: aPTT: 27 seconds (ref 24–36)

## 2015-05-24 MED ORDER — MORPHINE SULFATE (PF) 2 MG/ML IV SOLN
2.0000 mg | Freq: Once | INTRAVENOUS | Status: DC
  Filled 2015-05-24: qty 1

## 2015-05-24 MED ORDER — HYDROMORPHONE HCL 1 MG/ML IJ SOLN
INTRAMUSCULAR | Status: AC
  Administered 2015-05-24: 1 mg via INTRAVENOUS
  Filled 2015-05-24: qty 1

## 2015-05-24 MED ORDER — HEPARIN BOLUS VIA INFUSION
5000.0000 [IU] | Freq: Once | INTRAVENOUS | Status: AC
  Administered 2015-05-24: 5000 [IU] via INTRAVENOUS
  Filled 2015-05-24: qty 5000

## 2015-05-24 MED ORDER — ORPHENADRINE CITRATE 30 MG/ML IJ SOLN
60.0000 mg | Freq: Two times a day (BID) | INTRAMUSCULAR | Status: DC
  Filled 2015-05-24: qty 2

## 2015-05-24 MED ORDER — ORPHENADRINE CITRATE 30 MG/ML IJ SOLN
60.0000 mg | Freq: Two times a day (BID) | INTRAMUSCULAR | Status: DC
  Administered 2015-05-24: 60 mg via INTRAVENOUS
  Filled 2015-05-24 (×3): qty 2

## 2015-05-24 MED ORDER — IOHEXOL 350 MG/ML SOLN
100.0000 mL | Freq: Once | INTRAVENOUS | Status: AC | PRN
  Administered 2015-05-24: 100 mL via INTRAVENOUS
  Filled 2015-05-24: qty 100

## 2015-05-24 MED ORDER — HEPARIN (PORCINE) IN NACL 100-0.45 UNIT/ML-% IJ SOLN
1750.0000 [IU]/h | INTRAMUSCULAR | Status: DC
  Administered 2015-05-24: 1600 [IU]/h via INTRAVENOUS
  Filled 2015-05-24 (×2): qty 250

## 2015-05-24 MED ORDER — HYDROMORPHONE HCL 1 MG/ML IJ SOLN
1.0000 mg | INTRAMUSCULAR | Status: AC
  Administered 2015-05-24: 1 mg via INTRAVENOUS

## 2015-05-24 MED ORDER — MORPHINE SULFATE (PF) 2 MG/ML IV SOLN
2.0000 mg | Freq: Once | INTRAVENOUS | Status: AC
  Administered 2015-05-24: 2 mg via INTRAVENOUS

## 2015-05-24 MED ORDER — MORPHINE SULFATE (PF) 2 MG/ML IV SOLN
2.0000 mg | Freq: Once | INTRAVENOUS | Status: AC
  Administered 2015-05-24: 2 mg via INTRAVENOUS
  Filled 2015-05-24: qty 1

## 2015-05-24 MED ORDER — HYDROMORPHONE HCL 1 MG/ML IJ SOLN: 1.0000 mg | INTRAMUSCULAR | Status: DC

## 2015-05-24 NOTE — ED Notes (Signed)
Presents with upper back pain/side pain since this afternoon. Has been doing exercises for recent rotator cuff surgery and wonders if it might have something to do with that. Denies CP, but states when he breathes "it just grabs" and he is "taking shorter breaths." Able to speak in complete sentences without difficulty. Feels more comfortable standing rather than sitting.

## 2015-05-24 NOTE — ED Notes (Signed)
Pt had shoulder surgery x4weeks ago, states his pain in uncontrollable at home taht started this afternoon. C/o pain in lower right back. States it is painful to breathe. Denies any new injury

## 2015-05-24 NOTE — Progress Notes (Signed)
ANTICOAGULATION CONSULT NOTE - Initial Consult  Pharmacy Consult for heparin Indication: pulmonary embolus  Allergies  Allergen Reactions  . Mercury   . Penicillins     Patient Measurements: Height: 6' (182.9 cm) Weight: 210 lb (95.255 kg) IBW/kg (Calculated) : 77.6 Heparin Dosing Weight: 95.3 kg  Vital Signs: Temp: 99.1 F (37.3 C) (03/01 2305) Temp Source: Oral (03/01 2305) BP: 138/94 mmHg (03/01 2305) Pulse Rate: 103 (03/01 2305)  Labs:  Recent Labs  05/24/15 2117  HGB 16.6  HCT 48.2  PLT 235  CREATININE 0.96    Estimated Creatinine Clearance: 105.4 mL/min (by C-G formula based on Cr of 0.96).   Medical History: Past Medical History  Diagnosis Date  . Arthritis     Medications:  Infusions:  . heparin      Assessment: 55 yom cc upper back pain/side pain since this afternoon, recent rotator cuff injury. CT shows nonocclusive focus of thrombus along LLL posterior basal segmental pulmonary artery, notes that it may not be acute. Pharmacy consulted to dose heparin for PE.  Goal of Therapy:  Heparin level 0.3-0.7 units/ml Monitor platelets by anticoagulation protocol: Yes   Plan:  Give 5000 units bolus x 1 Start heparin infusion at 1600 units/hr Check anti-Xa level in 6 hours and daily while on heparin Continue to monitor H&H and platelets  Laural Benes, Pharm.D., BCPS Clinical Pharmacist 05/24/2015,11:17 PM

## 2015-05-24 NOTE — ED Notes (Signed)
Patient transported to CT 

## 2015-05-24 NOTE — ED Provider Notes (Signed)
CSN: [id number]     Arrival date & time 05/24/15  1930 History   First MD Initiated Contact with Patient 05/24/15 2026     Chief Complaint  Patient presents with  . Back Pain    Mid back pain - recent shoulder surgery four weeks ago tomorrow     (Consider location/radiation/quality/duration/timing/severity/associated sxs/prior Treatment) HPI  55 year old male presents to emergency department for evaluation of acute left upper back pain. He is status post left shoulder rotator cuff repair performed in [facility] by Dr. Percell Miller. He has been doing well up until this afternoon. Postoperative pain has been well-controlled. He has not been taking any anti-coagulation medications. Pain had been controlled with ibuprofen. Today after physical therapy he developed sharp pain that he described as grabbing along the left lateral scapular border. He is increased with taking a deep breath. Pain is severe 10 out of 10. He denies any chest pain or shortness of breath.  Past Medical History  Diagnosis Date  . Arthritis    Past Surgical History  Procedure Laterality Date  . Biceps tendon repair    . Shoulder surgery    . Shoulder arthroscopy with rotator cuff repair and subacromial decompression Left 04/27/2015    Procedure: LEFT SHOULDER ARTHROSCOPY WITH DEBRIDEMENT, ACROMIOPLASTY, ROTATOR CUFF REPAIR;  Surgeon: [name], MD;  Location: [place];  Service: Orthopedics;  Laterality: Left;  . Resection distal clavical Left 04/27/2015    Procedure: DISTAL CLAVICLE EXCISION;  Surgeon: [name], MD;  Location: [place];  Service: Orthopedics;  Laterality: Left;   Family History  Problem Relation Age of Onset  . Breast cancer Mother   . Hyperlipidemia Mother   . Pancreatic cancer Father   . Healthy Sister   . Healthy Daughter   . Healthy Son    Social History  Substance Use Topics  . Smoking status: Never Smoker   . Smokeless tobacco: Current User  .  Alcohol Use: 0.0 oz/week    0 Standard drinks or equivalent per week     Comment: OCCASIONALLY    Review of Systems  Constitutional: Negative.  Negative for fever, chills, activity change and appetite change.  HENT: Negative for congestion, ear pain, mouth sores, rhinorrhea, sinus pressure, sore throat and trouble swallowing.   Eyes: Negative for photophobia, pain and discharge.  Respiratory: Negative for cough, chest tightness and shortness of breath.   Cardiovascular: Negative for chest pain and leg swelling.  Gastrointestinal: Negative for nausea, vomiting, abdominal pain, diarrhea and abdominal distention.  Genitourinary: Negative for dysuria and difficulty urinating.  Musculoskeletal: Positive for back pain. Negative for arthralgias and gait problem.  Skin: Negative for color change and rash.  Neurological: Negative for dizziness and headaches.  Hematological: Negative for adenopathy.  Psychiatric/Behavioral: Negative for behavioral problems and agitation.      Allergies  Mercury and Penicillins  Home Medications   Prior to Admission medications   Medication Sig Start Date End Date Taking? Authorizing Provider  Cholecalciferol (VITAMIN D) 2000 UNITS CAPS Take 1 capsule by mouth daily.   Yes Historical Provider, MD  hydrocortisone (ANUSOL-HC) 2.5 % rectal cream Place 1 application rectally at bedtime. 03/23/14  Yes Historical Provider, MD  MULTIPLE VITAMIN PO Take 1 tablet by mouth daily.   Yes Historical Provider, MD  Omega-3 Fatty Acids (FISH OIL BURP-LESS) 1200 MG CAPS Take 2 capsules by mouth daily.   Yes Historical Provider, MD  vitamin E 400 UNIT capsule Take 1 capsule by mouth  daily.   Yes Historical Provider, MD  ondansetron (ZOFRAN) 4 MG tablet Take 1 tablet (4 mg total) by mouth every 8 (eight) hours as needed for nausea or vomiting. Patient not taking: Reported on 05/24/2015 04/27/15   Aundra Dubin, PA-C  oxyCODONE-acetaminophen (ROXICET) 5-325 MG tablet Take 1-2  tablets by mouth every 4 (four) hours as needed. Patient not taking: Reported on 05/24/2015 04/27/15   Aundra Dubin, PA-C   BP 138/94 mmHg  Pulse 103  Temp(Src) 99.1 F (37.3 C) (Oral)  Resp 18  Ht 6' (1.829 m)  Wt 95.255 kg  BMI 28.47 kg/m2  SpO2 96% Physical Exam  Constitutional: He is oriented to person, place, and time. He appears well-developed and well-nourished.  HENT:  Head: Normocephalic and atraumatic.  Eyes: Conjunctivae and EOM are normal. Pupils are equal, round, and reactive to light.  Neck: Normal range of motion. Neck supple.  Cardiovascular: Normal rate, regular rhythm, normal heart sounds and intact distal pulses.   Pulmonary/Chest: Breath sounds normal. No respiratory distress. He has no wheezes. He has no rales. He exhibits no tenderness.  Unable to take full deep breath due to sharp pain  Abdominal: Soft. Bowel sounds are normal. He exhibits no distension. There is no tenderness.  Musculoskeletal: Normal range of motion. He exhibits no edema or tenderness.  Examination of the left shoulder and upper extremity shows no swelling warmth erythema. He has 2+ radial pulse. No edema. Shoulder shows no signs of infection. Patient is nontender throughout the scapular border. Back pain is unable to be reproduced on exam with palpation.  Neurological: He is alert and oriented to person, place, and time.  Skin: Skin is warm and dry.  Psychiatric: He has a normal mood and affect. His behavior is normal. Judgment and thought content normal.    ED Course  Procedures (including critical care time) Labs Review Labs Reviewed  CBC - Abnormal; Notable for the following:    WBC 11.0 (*)    All other components within normal limits  BASIC METABOLIC PANEL - Abnormal; Notable for the following:    Glucose, Bld 112 (*)    All other components within normal limits  APTT  PROTIME-INR  HEPARIN LEVEL (UNFRACTIONATED)    Imaging Review Dg Chest 2 View  05/24/2015  CLINICAL DATA:   PT c/o left posterior stabbing CP beginning this afternoon, PT is 4 weeks s/p left rotator cuff surgery and has not lifted left arm in therapy yet. EXAM: CHEST  2 VIEW COMPARISON:  08/10/2014 FINDINGS: Lung volumes are relatively low. There is mild opacity at the left lateral lung base that is likely atelectasis. There is no convincing pneumonia. No pulmonary edema. No pleural effusion or pneumothorax. Heart, mediastinum and hila are unremarkable. Bony thorax is intact. IMPRESSION: No acute cardiopulmonary disease. Electronically Signed   By: Lajean Manes M.D.   On: 05/24/2015 21:07   Ct Angio Chest Pe W/cm &/or Wo Cm  05/24/2015  CLINICAL DATA:  Presents with upper back pain/side pain since this afternoon. Has been doing exercises for recent rotator cuff surgery and wonders if it might have something to do with that. Denies CP, but states when he breathes "it just grabs" and he is "taking shorter breaths." Able to speak in complete sentences without difficulty. Feels more comfortable standing rather than sitting. EXAM: CT ANGIOGRAPHY CHEST WITH CONTRAST TECHNIQUE: Multidetector CT imaging of the chest was performed using the standard protocol during bolus administration of intravenous contrast. Multiplanar CT image reconstructions  and MIPs were obtained to evaluate the vascular anatomy. CONTRAST:  19m OMNIPAQUE IOHEXOL 350 MG/ML SOLN COMPARISON:  Current chest radiograph.  Chest CTA, 04/07/2004. FINDINGS: Angiographic study: There is a nonocclusive focus of thrombus along the left lower lobe posterior basal segmental pulmonary artery, that lies along 1 margin of the vessel. This may not be acute. There is no other evidence of a pulmonary embolus. The great vessels are normal in caliber. No aortic dissection. No aortic atherosclerosis. Neck base and axilla:  No mass or adenopathy. Mediastinum and hila: Heart normal in size and configuration. Minor coronary artery calcification. No mediastinal or hilar masses or  adenopathy. Lungs and pleura: Mild atelectasis in the dependent lower lobes, left greater than right, and in the posterior lateral base of the left upper lobe lingula. No convincing pneumonia. No pulmonary edema. No mass or nodule. No pleural effusion or pneumothorax. Limited upper abdomen: Fatty infiltration of the liver. Otherwise unremarkable. Musculoskeletal: Mild degenerative endplate spurring along the mid to lower thoracic spine. Otherwise unremarkable. Review of the MIP images confirms the above findings. IMPRESSION: 1. Single small nonocclusive pulmonary embolus to the posterior basal segmental pulmonary artery branch to the left lower lobe. Although this may be acute, could reflect an area of chronic thrombus. No other pulmonary emboli. 2. Mild lung base atelectasis, left greater than right. No evidence of pneumonia or pulmonary edema. 3. Hepatic steatosis. Electronically Signed   By: DLajean ManesM.D.   On: 05/24/2015 22:48   I have personally reviewed and evaluated these images and lab results as part of my medical decision-making.   EKG Interpretation None      MDM   Final diagnoses:  Pulmonary embolism, other ([place]    55year old male status post left shoulder rotator cuff repair by Dr. MPercell Miller02/04/2015. Today, developed sudden left upper back pain. Pain was not reproducible on exam and increased with taking a deep breath. Chest x-ray negative, CT angiogram showed single small nonocclusive pulmonary emboli to the posterior basal segmental pulmonary artery branch to the left lower lobe. Patient is started on heparin and admitted to hospitalist service. Vital signs are stable.   TDuanne Guess PA-C 05/24/15 2329  RLisa Roca MD 05/24/15 [phone]

## 2015-05-25 ENCOUNTER — Telehealth: Payer: Self-pay | Admitting: Family Medicine

## 2015-05-25 LAB — CBC
HEMATOCRIT: 47.1 % (ref 40.0–52.0)
HEMOGLOBIN: 15.9 g/dL (ref 13.0–18.0)
MCH: 29.7 pg (ref 26.0–34.0)
MCHC: 33.8 g/dL (ref 32.0–36.0)
MCV: 87.8 fL (ref 80.0–100.0)
Platelets: 212 10*3/uL (ref 150–440)
RBC: 5.37 MIL/uL (ref 4.40–5.90)
RDW: 12.9 % (ref 11.5–14.5)
WBC: 11.7 10*3/uL — AB (ref 3.8–10.6)

## 2015-05-25 LAB — BASIC METABOLIC PANEL
ANION GAP: 10 (ref 5–15)
BUN: 16 mg/dL (ref 6–20)
CO2: 23 mmol/L (ref 22–32)
Calcium: 8.8 mg/dL — ABNORMAL LOW (ref 8.9–10.3)
Chloride: 104 mmol/L (ref 101–111)
Creatinine, Ser: 0.96 mg/dL (ref 0.61–1.24)
GFR calc Af Amer: >60 mL/min (ref >60)
Glucose, Bld: 126 mg/dL — ABNORMAL HIGH (ref 65–99)
POTASSIUM: 3.7 mmol/L (ref 3.5–5.1)
SODIUM: 137 mmol/L (ref 135–145)

## 2015-05-25 LAB — HEPARIN LEVEL (UNFRACTIONATED): Heparin Unfractionated: 0.27 IU/mL — ABNORMAL LOW (ref 0.30–0.70)

## 2015-05-25 MED ORDER — RIVAROXABAN 15 MG PO TABS
15.0000 mg | ORAL_TABLET | Freq: Two times a day (BID) | ORAL | Status: DC
  Administered 2015-05-25: 15 mg via ORAL
  Filled 2015-05-25 (×2): qty 1

## 2015-05-25 MED ORDER — ACETAMINOPHEN 650 MG RE SUPP: 650.0000 mg | Freq: Four times a day (QID) | RECTAL | Status: DC | PRN

## 2015-05-25 MED ORDER — HYDROMORPHONE HCL 1 MG/ML IJ SOLN
1.0000 mg | INTRAMUSCULAR | Status: AC
  Administered 2015-05-25: 1 mg via INTRAVENOUS

## 2015-05-25 MED ORDER — MORPHINE SULFATE (PF) 2 MG/ML IV SOLN: 2.0000 mg | INTRAVENOUS | Status: DC | PRN

## 2015-05-25 MED ORDER — ONDANSETRON HCL 4 MG/2ML IJ SOLN: 4.0000 mg | Freq: Four times a day (QID) | INTRAMUSCULAR | Status: DC | PRN

## 2015-05-25 MED ORDER — RIVAROXABAN (XARELTO) VTE STARTER PACK (15 & 20 MG): ORAL_TABLET | ORAL | Status: DC

## 2015-05-25 MED ORDER — APIXABAN 5 MG PO TABS: 5.0000 mg | ORAL_TABLET | Freq: Two times a day (BID) | ORAL | Status: DC

## 2015-05-25 MED ORDER — ONDANSETRON HCL 4 MG/2ML IJ SOLN
INTRAMUSCULAR | Status: AC
  Administered 2015-05-25: 4 mg via INTRAVENOUS
  Filled 2015-05-25: qty 2

## 2015-05-25 MED ORDER — HYDROMORPHONE HCL 1 MG/ML IJ SOLN
INTRAMUSCULAR | Status: AC
  Administered 2015-05-25: 1 mg via INTRAVENOUS
  Filled 2015-05-25: qty 1

## 2015-05-25 MED ORDER — SODIUM CHLORIDE 0.9% FLUSH: 3.0000 mL | Freq: Two times a day (BID) | INTRAVENOUS | Status: DC

## 2015-05-25 MED ORDER — HEPARIN BOLUS VIA INFUSION
1400.0000 [IU] | Freq: Once | INTRAVENOUS | Status: AC
  Administered 2015-05-25: 1400 [IU] via INTRAVENOUS
  Filled 2015-05-25: qty 1400

## 2015-05-25 MED ORDER — RIVAROXABAN 15 MG PO TABS
15.0000 mg | ORAL_TABLET | Freq: Two times a day (BID) | ORAL | Status: DC
  Filled 2015-05-25: qty 1

## 2015-05-25 MED ORDER — ONDANSETRON HCL 4 MG/2ML IJ SOLN
4.0000 mg | Freq: Once | INTRAMUSCULAR | Status: AC
  Administered 2015-05-25: 4 mg via INTRAVENOUS

## 2015-05-25 MED ORDER — OXYCODONE HCL 5 MG PO TABS
5.0000 mg | ORAL_TABLET | ORAL | Status: DC | PRN
  Administered 2015-05-25: 5 mg via ORAL
  Filled 2015-05-25 (×2): qty 1

## 2015-05-25 MED ORDER — OXYCODONE-ACETAMINOPHEN 5-325 MG PO TABS: 1.0000 | ORAL_TABLET | Freq: Four times a day (QID) | ORAL | Status: DC | PRN

## 2015-05-25 MED ORDER — ACETAMINOPHEN 325 MG PO TABS: 650.0000 mg | ORAL_TABLET | Freq: Four times a day (QID) | ORAL | Status: DC | PRN

## 2015-05-25 MED ORDER — APIXABAN 5 MG PO TABS: 10.0000 mg | ORAL_TABLET | Freq: Two times a day (BID) | ORAL | Status: DC

## 2015-05-25 MED ORDER — ONDANSETRON HCL 4 MG PO TABS: 4.0000 mg | ORAL_TABLET | Freq: Four times a day (QID) | ORAL | Status: DC | PRN

## 2015-05-25 NOTE — Telephone Encounter (Signed)
Pt is being discharged from [facility] today for pulmonary embolisms.  I have scheduled a hospital follow up/MW

## 2015-05-25 NOTE — Discharge Summary (Signed)
[facility] [place] NAME: Tyler Mcclure    MR#:  [id number]  DATE OF BIRTH:  01-Nov-1960  DATE OF ADMISSION:  05/24/2015 ADMITTING PHYSICIAN: Lance Coon, MD  DATE OF DISCHARGE: 05/25/2015  PRIMARY CARE PHYSICIAN: [name], MD    ADMISSION DIAGNOSIS:  Pulmonary embolism, other ([place]) [I26.99]  DISCHARGE DIAGNOSIS:  Principal Problem:   Pulmonary embolus (HCC) Active Problems:   Rupture of tendon of biceps, long head   Arthritis   SECONDARY DIAGNOSIS:   Past Medical History  Diagnosis Date  . Arthritis     HOSPITAL COURSE:   Pulmonary embolus (HCC) - nonocclusive, heparin drip started in the ED,   switched to xarelto next day.  Advised to take percocet for pain.   Rupture of tendon of biceps, long head - leading to rotator cuff surgery of several weeks ago, see above.  Arthritis - treat when necessary  DISCHARGE CONDITIONS:   Stable.  CONSULTS OBTAINED:     DRUG ALLERGIES:   Allergies  Allergen Reactions  . Mercury   . Penicillins     DISCHARGE MEDICATIONS:   Current Discharge Medication List    START taking these medications   Details  Rivaroxaban (XARELTO STARTER PACK) 15 & 20 MG TBPK Take as directed on package: Start with one 67m tablet by mouth twice a day with food. On Day 22, switch to one 250mtablet once a day with food. Qty: 51 each, Refills: 0      CONTINUE these medications which have CHANGED   Details  oxyCODONE-acetaminophen (ROXICET) 5-325 MG tablet Take 1 tablet by mouth every 6 (six) hours as needed for moderate pain or severe pain. Qty: 30 tablet, Refills: 0      CONTINUE these medications which have NOT CHANGED   Details  Cholecalciferol (VITAMIN D) 2000 UNITS CAPS Take 1 capsule by mouth daily.    hydrocortisone (ANUSOL-HC) 2.5 % rectal cream Place 1 application rectally at bedtime.    MULTIPLE VITAMIN PO Take 1 tablet by mouth daily.    Omega-3 Fatty Acids  (FISH OIL BURP-LESS) 1200 MG CAPS Take 2 capsules by mouth daily.    vitamin E 400 UNIT capsule Take 1 capsule by mouth daily.    ondansetron (ZOFRAN) 4 MG tablet Take 1 tablet (4 mg total) by mouth every 8 (eight) hours as needed for nausea or vomiting. Qty: 40 tablet, Refills: 0         DISCHARGE INSTRUCTIONS:    Follow with PMD in 1-2 weeks.  If you experience worsening of your admission symptoms, develop shortness of breath, life threatening emergency, suicidal or homicidal thoughts you must seek medical attention immediately by calling 911 or calling your MD immediately  if symptoms less severe.  You Must read complete instructions/literature along with all the possible adverse reactions/side effects for all the Medicines you take and that have been prescribed to you. Take any new Medicines after you have completely understood and accept all the possible adverse reactions/side effects.   Please note  You were cared for by a hospitalist during your hospital stay. If you have any questions about your discharge medications or the care you received while you were in the hospital after you are discharged, you can call the unit and asked to speak with the hospitalist on call if the hospitalist that took care of you is not available. Once you are discharged, your primary care physician will handle any further medical issues. Please  note that NO REFILLS for any discharge medications will be authorized once you are discharged, as it is imperative that you return to your primary care physician (or establish a relationship with a primary care physician if you do not have one) for your aftercare needs so that they can reassess your need for medications and monitor your lab values.    Today   CHIEF COMPLAINT:   Chief Complaint  Patient presents with  . Back Pain    Mid back pain - recent shoulder surgery four weeks ago tomorrow    HISTORY OF PRESENT ILLNESS:  Tyler Mcclure  is a 55 y.o.  male with lateral thoracic pain. He had a shoulder surgery 4 weeks ago for rotator cuff pathology on his left shoulder. He is beginning physical therapy since that time. However couple of days ago he began to develop left lateral thoracic pain. This pain got worse, he came in today for evaluation. CTA chest showed nonocclusive pulmonary embolus. Hospitalists were called for admission.   VITAL SIGNS:  Blood pressure 120/75, pulse 90, temperature 98.3 F (36.8 C), temperature source Oral, resp. rate 18, height 6' (1.829 m), weight 95.255 kg (210 lb), SpO2 91 %.  I/O:  No intake or output data in the 24 hours ending 05/25/15 0937  PHYSICAL EXAMINATION:   Constitutional: He is oriented to person, place, and time. He appears well-developed and well-nourished. No distress.  HENT:  Head: Normocephalic and atraumatic.  Mouth/Throat: Oropharynx is clear and moist.  Eyes: Conjunctivae and EOM are normal. Pupils are equal, round, and reactive to light. No scleral icterus.  Neck: Normal range of motion. Neck supple. No JVD present. No thyromegaly present.  Cardiovascular: Normal rate, regular rhythm and intact distal pulses. Exam reveals no gallop and no friction rub.  No murmur heard. Respiratory: Effort normal and breath sounds normal. No respiratory distress. He has no wheezes. He has no rales.  GI: Soft. Bowel sounds are normal. He exhibits no distension. There is no tenderness.  Musculoskeletal: Normal range of motion. He exhibits no edema.  Left arm in sling, maintain, No arthritis, no gout  Lymphadenopathy:   He has no cervical adenopathy.  Neurological: He is alert and oriented to person, place, and time. No cranial nerve deficit.  No dysarthria, no aphasia  Skin: Skin is warm and dry. No rash noted. No erythema.  Psychiatric: He has a normal mood and affect. His behavior is normal. Judgment and thought content normal.   DATA REVIEW:   CBC  Recent Labs Lab 05/25/15 0551  WBC  11.7*  HGB 15.9  HCT 47.1  PLT 212    Chemistries   Recent Labs Lab 05/25/15 0551  NA 137  K 3.7  CL 104  CO2 23  GLUCOSE 126*  BUN 16  CREATININE 0.96  CALCIUM 8.8*    Cardiac Enzymes No results for input(s): TROPONINI in the last 168 hours.  Microbiology Results  No results found for this or any previous visit.  RADIOLOGY:  Dg Chest 2 View  05/24/2015  CLINICAL DATA:  PT c/o left posterior stabbing CP beginning this afternoon, PT is 4 weeks s/p left rotator cuff surgery and has not lifted left arm in therapy yet. EXAM: CHEST  2 VIEW COMPARISON:  08/10/2014 FINDINGS: Lung volumes are relatively low. There is mild opacity at the left lateral lung base that is likely atelectasis. There is no convincing pneumonia. No pulmonary edema. No pleural effusion or pneumothorax. Heart, mediastinum and hila are unremarkable. Bony  thorax is intact. IMPRESSION: No acute cardiopulmonary disease. Electronically Signed   By: Lajean Manes M.D.   On: 05/24/2015 21:07   Ct Angio Chest Pe W/cm &/or Wo Cm  05/24/2015  CLINICAL DATA:  Presents with upper back pain/side pain since this afternoon. Has been doing exercises for recent rotator cuff surgery and wonders if it might have something to do with that. Denies CP, but states when he breathes "it just grabs" and he is "taking shorter breaths." Able to speak in complete sentences without difficulty. Feels more comfortable standing rather than sitting. EXAM: CT ANGIOGRAPHY CHEST WITH CONTRAST TECHNIQUE: Multidetector CT imaging of the chest was performed using the standard protocol during bolus administration of intravenous contrast. Multiplanar CT image reconstructions and MIPs were obtained to evaluate the vascular anatomy. CONTRAST:  177m OMNIPAQUE IOHEXOL 350 MG/ML SOLN COMPARISON:  Current chest radiograph.  Chest CTA, 04/07/2004. FINDINGS: Angiographic study: There is a nonocclusive focus of thrombus along the left lower lobe posterior basal segmental  pulmonary artery, that lies along 1 margin of the vessel. This may not be acute. There is no other evidence of a pulmonary embolus. The great vessels are normal in caliber. No aortic dissection. No aortic atherosclerosis. Neck base and axilla:  No mass or adenopathy. Mediastinum and hila: Heart normal in size and configuration. Minor coronary artery calcification. No mediastinal or hilar masses or adenopathy. Lungs and pleura: Mild atelectasis in the dependent lower lobes, left greater than right, and in the posterior lateral base of the left upper lobe lingula. No convincing pneumonia. No pulmonary edema. No mass or nodule. No pleural effusion or pneumothorax. Limited upper abdomen: Fatty infiltration of the liver. Otherwise unremarkable. Musculoskeletal: Mild degenerative endplate spurring along the mid to lower thoracic spine. Otherwise unremarkable. Review of the MIP images confirms the above findings. IMPRESSION: 1. Single small nonocclusive pulmonary embolus to the posterior basal segmental pulmonary artery branch to the left lower lobe. Although this may be acute, could reflect an area of chronic thrombus. No other pulmonary emboli. 2. Mild lung base atelectasis, left greater than right. No evidence of pneumonia or pulmonary edema. 3. Hepatic steatosis. Electronically Signed   By: DLajean ManesM.D.   On: 05/24/2015 22:48    EKG:  No orders found for this or any previous visit.    Management plans discussed with the patient, family and they are in agreement.  CODE STATUS:     Code Status Orders        Start     Ordered   05/25/15 0339  Full code   Continuous     05/25/15 0338    Code Status History    Date Active Date Inactive Code Status Order ID Comments User Context   This patient has a current code status but no historical code status.      TOTAL TIME TAKING CARE OF THIS PATIENT: 35 minutes.    VVaughan BastaM.D on 05/25/2015 at 9:37 AM  Between 7am to 6pm - Pager -  [phone]  After 6pm go to [email or web address] - password [email or web address]  Office  [phone] CC: Primary care physician; RWilhemena Durie MD   Note: This dictation was prepared with Dragon dictation along with smaller phrase technology. Any transcriptional errors that result from this process are unintentional.

## 2015-05-25 NOTE — Progress Notes (Signed)
Pt discharged home all questions answered. Patient and family verbalize understanding to plan of care and is agreeable. I explained to patient and family the importance of taking his xarelto as directed. Pt got his first dose before discharge. I educated him on bleeding precautions as well as to come back to the ED right away with any increased SOB, confusion or worsening pain.

## 2015-05-25 NOTE — Progress Notes (Signed)
ANTICOAGULATION CONSULT NOTE - Initial Consult  Pharmacy Consult for apixaban Indication: pulmonary embolus  Allergies  Allergen Reactions  . Mercury   . Penicillins     Patient Measurements: Height: 6' (182.9 cm) Weight: 210 lb (95.255 kg) IBW/kg (Calculated) : 77.6   Vital Signs: Temp: 98.3 F (36.8 C) (03/02 0751) Temp Source: Oral (03/02 0751) BP: 120/75 mmHg (03/02 0751) Pulse Rate: 90 (03/02 0751)  Labs:  Recent Labs  05/24/15 2117 05/24/15 2331 05/25/15 0551  HGB 16.6  --  15.9  HCT 48.2  --  47.1  PLT 235  --  212  APTT  --  27  --   LABPROT  --  13.1  --   INR  --  0.97  --   HEPARINUNFRC  --   --  0.27*  CREATININE 0.96  --  0.96    Estimated Creatinine Clearance: 105.4 mL/min (by C-G formula based on Cr of 0.96).   Medical History: Past Medical History  Diagnosis Date  . Arthritis     Medications:  Scheduled:  . apixaban  10 mg Oral BID   Followed by  . [START ON 06/01/2015] apixaban  5 mg Oral BID  . sodium chloride flush  3 mL Intravenous Q12H    Assessment: Pharmacy consulted to dose and monitor apixaban in a 55 yo male with pulmonary embolism.  Patient currently receiving anticoagulation with a heparin drip.  SCr: 0.96, Wt: 95.3 kg  Goal of Therapy:  Monitor CBC and SCr every 3 days per policy   Plan:  Will discontinue heparin drip orders and order apixaban 10 mg po BID for 7 days followed by 5 mg po BID.  Dosing based on age<80, SCr<1.5, and wt >60 kg.   Pharmacy will continue to follow.   Kemari Mares G 05/25/2015,9:19 AM

## 2015-05-25 NOTE — H&P (Signed)
[facility] [place] NAME: Tyler Mcclure    MR#:  [id number]  DATE OF BIRTH:  03-14-1961  DATE OF ADMISSION:  05/24/2015  PRIMARY CARE PHYSICIAN: [name], MD   REQUESTING/REFERRING PHYSICIAN: Reita Cliche, M.D.  CHIEF COMPLAINT:   Chief Complaint  Patient presents with  . Back Pain    Mid back pain - recent shoulder surgery four weeks ago tomorrow    HISTORY OF PRESENT ILLNESS:  Tyler Mcclure  is a 55 y.o. male who presents with lateral thoracic pain. He had a shoulder surgery 4 weeks ago for rotator cuff pathology on his left shoulder. He is beginning physical therapy since that time. However couple of days ago he began to develop left lateral thoracic pain. This pain got worse, he came in today for evaluation. CTA chest showed nonocclusive pulmonary embolus. Hospitalists were called for admission.  PAST MEDICAL HISTORY:   Past Medical History  Diagnosis Date  . Arthritis     PAST SURGICAL HISTORY:   Past Surgical History  Procedure Laterality Date  . Biceps tendon repair    . Shoulder surgery    . Shoulder arthroscopy with rotator cuff repair and subacromial decompression Left 04/27/2015    Procedure: LEFT SHOULDER ARTHROSCOPY WITH DEBRIDEMENT, ACROMIOPLASTY, ROTATOR CUFF REPAIR;  Surgeon: [name], MD;  Location: [facility];  Service: Orthopedics;  Laterality: Left;  . Resection distal clavical Left 04/27/2015    Procedure: DISTAL CLAVICLE EXCISION;  Surgeon: [name], MD;  Location: [facility];  Service: Orthopedics;  Laterality: Left;    SOCIAL HISTORY:   Social History  Substance Use Topics  . Smoking status: Never Smoker   . Smokeless tobacco: Current User  . Alcohol Use: 0.0 oz/week    0 Standard drinks or equivalent per week     Comment: OCCASIONALLY    FAMILY HISTORY:   Family History  Problem Relation Age of Onset  . Breast cancer Mother   . Hyperlipidemia  Mother   . Pancreatic cancer Father   . Healthy Sister   . Healthy Daughter   . Healthy Son     DRUG ALLERGIES:   Allergies  Allergen Reactions  . Mercury   . Penicillins     MEDICATIONS AT HOME:   Prior to Admission medications   Medication Sig Start Date End Date Taking? Authorizing Provider  Cholecalciferol (VITAMIN D) 2000 UNITS CAPS Take 1 capsule by mouth daily.   Yes Historical Provider, MD  hydrocortisone (ANUSOL-HC) 2.5 % rectal cream Place 1 application rectally at bedtime. 03/23/14  Yes Historical Provider, MD  MULTIPLE VITAMIN PO Take 1 tablet by mouth daily.   Yes Historical Provider, MD  Omega-3 Fatty Acids (FISH OIL BURP-LESS) 1200 MG CAPS Take 2 capsules by mouth daily.   Yes Historical Provider, MD  vitamin E 400 UNIT capsule Take 1 capsule by mouth daily.   Yes Historical Provider, MD  ondansetron (ZOFRAN) 4 MG tablet Take 1 tablet (4 mg total) by mouth every 8 (eight) hours as needed for nausea or vomiting. Patient not taking: Reported on 05/24/2015 04/27/15   Aundra Dubin, PA-C  oxyCODONE-acetaminophen (ROXICET) 5-325 MG tablet Take 1-2 tablets by mouth every 4 (four) hours as needed. Patient not taking: Reported on 05/24/2015 04/27/15   Aundra Dubin, PA-C    REVIEW OF SYSTEMS:  Review of Systems  Constitutional: Negative for fever, chills, weight loss and malaise/fatigue.  HENT: Negative for ear  pain, hearing loss and tinnitus.   Eyes: Negative for blurred vision, double vision, pain and redness.  Respiratory: Negative for cough, hemoptysis and shortness of breath.   Cardiovascular: Positive for chest pain. Negative for palpitations, orthopnea and leg swelling.  Gastrointestinal: Negative for nausea, vomiting, abdominal pain, diarrhea and constipation.  Genitourinary: Negative for dysuria, frequency and hematuria.  Musculoskeletal: Positive for joint pain ( left shoulder). Negative for back pain and neck pain.  Skin:       No acne, rash, or lesions   Neurological: Negative for dizziness, tremors, focal weakness and weakness.  Endo/Heme/Allergies: Negative for polydipsia. Does not bruise/bleed easily.  Psychiatric/Behavioral: Negative for depression. The patient is not nervous/anxious and does not have insomnia.      VITAL SIGNS:   Filed Vitals:   05/24/15 1938 05/24/15 2305  BP:  138/94  Pulse: 99 103  Temp: 99.3 F (37.4 C) 99.1 F (37.3 C)  TempSrc: Oral Oral  Resp: 20 18  Height: 6' (1.829 m)   Weight: 95.255 kg (210 lb)   SpO2: 97% 96%   Wt Readings from Last 3 Encounters:  05/24/15 95.255 kg (210 lb)  04/27/15 97.977 kg (216 lb)  12/21/14 95.437 kg (210 lb 6.4 oz)    PHYSICAL EXAMINATION:  Physical Exam  Vitals reviewed. Constitutional: He is oriented to person, place, and time. He appears well-developed and well-nourished. No distress.  HENT:  Head: Normocephalic and atraumatic.  Mouth/Throat: Oropharynx is clear and moist.  Eyes: Conjunctivae and EOM are normal. Pupils are equal, round, and reactive to light. No scleral icterus.  Neck: Normal range of motion. Neck supple. No JVD present. No thyromegaly present.  Cardiovascular: Normal rate, regular rhythm and intact distal pulses.  Exam reveals no gallop and no friction rub.   No murmur heard. Respiratory: Effort normal and breath sounds normal. No respiratory distress. He has no wheezes. He has no rales.  GI: Soft. Bowel sounds are normal. He exhibits no distension. There is no tenderness.  Musculoskeletal: Normal range of motion. He exhibits no edema.  Left arm in sling, maintain, No arthritis, no gout  Lymphadenopathy:    He has no cervical adenopathy.  Neurological: He is alert and oriented to person, place, and time. No cranial nerve deficit.  No dysarthria, no aphasia  Skin: Skin is warm and dry. No rash noted. No erythema.  Psychiatric: He has a normal mood and affect. His behavior is normal. Judgment and thought content normal.    LABORATORY  PANEL:   CBC  Recent Labs Lab 05/24/15 2117  WBC 11.0*  HGB 16.6  HCT 48.2  PLT 235   ------------------------------------------------------------------------------------------------------------------  Chemistries   Recent Labs Lab 05/24/15 2117  NA 140  K 3.9  CL 105  CO2 25  GLUCOSE 112*  BUN 15  CREATININE 0.96  CALCIUM 9.4   ------------------------------------------------------------------------------------------------------------------  Cardiac Enzymes No results for input(s): TROPONINI in the last 168 hours. ------------------------------------------------------------------------------------------------------------------  RADIOLOGY:  Dg Chest 2 View  05/24/2015  CLINICAL DATA:  PT c/o left posterior stabbing CP beginning this afternoon, PT is 4 weeks s/p left rotator cuff surgery and has not lifted left arm in therapy yet. EXAM: CHEST  2 VIEW COMPARISON:  08/10/2014 FINDINGS: Lung volumes are relatively low. There is mild opacity at the left lateral lung base that is likely atelectasis. There is no convincing pneumonia. No pulmonary edema. No pleural effusion or pneumothorax. Heart, mediastinum and hila are unremarkable. Bony thorax is intact. IMPRESSION: No acute cardiopulmonary disease. Electronically  Signed   By: Lajean Manes M.D.   On: 05/24/2015 21:07   Ct Angio Chest Pe W/cm &/or Wo Cm  05/24/2015  CLINICAL DATA:  Presents with upper back pain/side pain since this afternoon. Has been doing exercises for recent rotator cuff surgery and wonders if it might have something to do with that. Denies CP, but states when he breathes "it just grabs" and he is "taking shorter breaths." Able to speak in complete sentences without difficulty. Feels more comfortable standing rather than sitting. EXAM: CT ANGIOGRAPHY CHEST WITH CONTRAST TECHNIQUE: Multidetector CT imaging of the chest was performed using the standard protocol during bolus administration of intravenous contrast.  Multiplanar CT image reconstructions and MIPs were obtained to evaluate the vascular anatomy. CONTRAST:  158m OMNIPAQUE IOHEXOL 350 MG/ML SOLN COMPARISON:  Current chest radiograph.  Chest CTA, 04/07/2004. FINDINGS: Angiographic study: There is a nonocclusive focus of thrombus along the left lower lobe posterior basal segmental pulmonary artery, that lies along 1 margin of the vessel. This may not be acute. There is no other evidence of a pulmonary embolus. The great vessels are normal in caliber. No aortic dissection. No aortic atherosclerosis. Neck base and axilla:  No mass or adenopathy. Mediastinum and hila: Heart normal in size and configuration. Minor coronary artery calcification. No mediastinal or hilar masses or adenopathy. Lungs and pleura: Mild atelectasis in the dependent lower lobes, left greater than right, and in the posterior lateral base of the left upper lobe lingula. No convincing pneumonia. No pulmonary edema. No mass or nodule. No pleural effusion or pneumothorax. Limited upper abdomen: Fatty infiltration of the liver. Otherwise unremarkable. Musculoskeletal: Mild degenerative endplate spurring along the mid to lower thoracic spine. Otherwise unremarkable. Review of the MIP images confirms the above findings. IMPRESSION: 1. Single small nonocclusive pulmonary embolus to the posterior basal segmental pulmonary artery branch to the left lower lobe. Although this may be acute, could reflect an area of chronic thrombus. No other pulmonary emboli. 2. Mild lung base atelectasis, left greater than right. No evidence of pneumonia or pulmonary edema. 3. Hepatic steatosis. Electronically Signed   By: DLajean ManesM.D.   On: 05/24/2015 22:48    EKG:  No orders found for this or any previous visit.  IMPRESSION AND PLAN:  Principal Problem:   Pulmonary embolus (HCC) - nonocclusive, heparin drip started in the ED, we'll continue on admission. Will need to be switched to oral anticoagulation later  today. Active Problems:   Rupture of tendon of biceps, long head - leading to rotator cuff surgery of several weeks ago, see above.   Arthritis - treat when necessary  All the records are reviewed and case discussed with ED provider. Management plans discussed with the patient and/or family.  DVT PROPHYLAXIS: Systemic anticoagulation  GI PROPHYLAXIS: None  ADMISSION STATUS: Observation  CODE STATUS: Full Code Status History    This patient does not have a recorded code status. Please follow your organizational policy for patients in this situation.      TOTAL TIME TAKING CARE OF THIS PATIENT: 40 minutes.    Syona Wroblewski FCoopersburg3/04/2015, 12:38 AM  [facility]  Office  [phone] CC: Primary care physician; RWilhemena Durie MD

## 2015-05-25 NOTE — Progress Notes (Signed)
ANTICOAGULATION CONSULT NOTE - Initial Consult  Pharmacy Consult for heparin Indication: pulmonary embolus  Allergies  Allergen Reactions  . Mercury   . Penicillins     Patient Measurements: Height: 6' (182.9 cm) Weight: 210 lb (95.255 kg) IBW/kg (Calculated) : 77.6 Heparin Dosing Weight: 95.3 kg  Vital Signs: Temp: 97.8 F (36.6 C) (03/02 0357) Temp Source: Oral (03/02 0357) BP: 133/80 mmHg (03/02 0357) Pulse Rate: 89 (03/02 0357)  Labs:  Recent Labs  05/24/15 2117 05/24/15 2331 05/25/15 0551  HGB 16.6  --  15.9  HCT 48.2  --  47.1  PLT 235  --  212  APTT  --  27  --   LABPROT  --  13.1  --   INR  --  0.97  --   HEPARINUNFRC  --   --  0.27*  CREATININE 0.96  --  0.96    Estimated Creatinine Clearance: 105.4 mL/min (by C-G formula based on Cr of 0.96).   Medical History: Past Medical History  Diagnosis Date  . Arthritis     Medications:  Infusions:  . heparin 1,600 Units/hr (05/24/15 2347)    Assessment: 55 yom cc upper back pain/side pain since this afternoon, recent rotator cuff injury. CT shows nonocclusive focus of thrombus along LLL posterior basal segmental pulmonary artery, notes that it may not be acute. Pharmacy consulted to dose heparin for PE.  Goal of Therapy:  Heparin level 0.3-0.7 units/ml Monitor platelets by anticoagulation protocol: Yes   Plan:  First heparin level subtherapeutic. 1400 units IV x 1 bolus and increase rate to 1750 units/hr. Will recheck heparin level in 6 hours.  Laural Benes, Pharm.D., BCPS Clinical Pharmacist 05/25/2015,6:57 AM

## 2015-05-26 NOTE — Telephone Encounter (Signed)
Spoke with patient. He is still having some pain on the side with deep breaths at times. He is comfortable now. He is getting better. He does not need anything at this time, he will come in for follow up as scheduled.-aa

## 2015-06-05 ENCOUNTER — Ambulatory Visit (INDEPENDENT_AMBULATORY_CARE_PROVIDER_SITE_OTHER): Payer: BLUE CROSS/BLUE SHIELD | Admitting: Family Medicine

## 2015-06-05 VITALS — BP 118/74 | HR 92 | Temp 98.2°F | Resp 16 | Wt 216.0 lb

## 2015-06-05 DIAGNOSIS — Z09 Encounter for follow-up examination after completed treatment for conditions other than malignant neoplasm: Secondary | ICD-10-CM

## 2015-06-05 DIAGNOSIS — I2699 Other pulmonary embolism without acute cor pulmonale: Secondary | ICD-10-CM | POA: Diagnosis not present

## 2015-06-05 MED ORDER — RIVAROXABAN 20 MG PO TABS: 20.0000 mg | ORAL_TABLET | Freq: Every day | ORAL | Status: DC

## 2015-06-05 NOTE — Progress Notes (Signed)
Patient ID: Tyler Mcclure, male   DOB: 1960-12-06, 55 y.o.   MRN: [id number]   Tyler Mcclure  MRN: [id number] DOB: 1961/02/24  Subjective:  HPI   1. Hospital discharge follow-up Patient had surgery for ruptured tendon of bicep on 04/27/15 and then hospitalized for PE on 3/1/-05/25/15.  He presents today for follow up.  2. Other pulmonary embolism without acute cor pulmonale, unspecified chronicity ([place]) Patient is a 55 year old male who presents for follow up of PE.  He was hospitalized 3/1-05/25/15 for PE after tendon surgery.  The patient states he has not had to take too much pain medication.  He has minimal chest pain, states every now and then he has some twinges, he has no shortness of breath and no abnormal bleeding.  He continues with physical therapy for his arm.  He was started on Xarelto.   Patient Active Problem List   Diagnosis Date Noted  . Pulmonary embolus ([place]) 05/24/2015  . Arthritis 05/24/2015  . Rupture of tendon of biceps, long head 11/25/2014  . Fecal occult blood test positive 11/25/2014  . Squamous cell carcinoma of skin 11/25/2014    Past Medical History  Diagnosis Date  . Arthritis     Social History   Social History  . Marital Status: Married    Spouse Name: N/A  . Number of Children: N/A  . Years of Education: N/A   Occupational History  . Not on file.   Social History Main Topics  . Smoking status: Never Smoker   . Smokeless tobacco: Current User  . Alcohol Use: 0.0 oz/week    0 Standard drinks or equivalent per week     Comment: OCCASIONALLY  . Drug Use: No  . Sexual Activity: Not on file   Other Topics Concern  . Not on file   Social History Narrative    Outpatient Prescriptions Prior to Visit  Medication Sig Dispense Refill  . Cholecalciferol (VITAMIN D) 2000 UNITS CAPS Take 1 capsule by mouth daily.    . hydrocortisone (ANUSOL-HC) 2.5 % rectal cream Place 1 application rectally at bedtime.    . MULTIPLE VITAMIN PO Take 1 tablet by  mouth daily.    . Omega-3 Fatty Acids (FISH OIL BURP-LESS) 1200 MG CAPS Take 2 capsules by mouth daily.    . ondansetron (ZOFRAN) 4 MG tablet Take 1 tablet (4 mg total) by mouth every 8 (eight) hours as needed for nausea or vomiting. 40 tablet 0  . oxyCODONE-acetaminophen (ROXICET) 5-325 MG tablet Take 1 tablet by mouth every 6 (six) hours as needed for moderate pain or severe pain. 30 tablet 0  . Rivaroxaban (XARELTO STARTER PACK) 15 & 20 MG TBPK Take as directed on package: Start with one 55m tablet by mouth twice a day with food. On Day 22, switch to one 273mtablet once a day with food. 51 each 0  . vitamin E 400 UNIT capsule Take 1 capsule by mouth daily.     No facility-administered medications prior to visit.    Allergies  Allergen Reactions  . Mercury   . Penicillins     Review of Systems  Constitutional: Negative for fever, chills and malaise/fatigue.  HENT: Negative for nosebleeds.   Eyes: Negative.   Respiratory: Negative for cough, hemoptysis, shortness of breath and wheezing.   Cardiovascular: Negative for chest pain, palpitations, orthopnea and leg swelling.  Gastrointestinal: Negative.   Genitourinary: Negative for hematuria.  Neurological: Negative for weakness.  Endo/Heme/Allergies: Negative.  Psychiatric/Behavioral: Negative.    Objective:  BP 118/74 mmHg  Pulse 92  Temp(Src) 98.2 F (36.8 C) (Oral)  Resp 16  Wt 216 lb (97.977 kg)  Physical Exam  Constitutional: He is oriented to person, place, and time and well-developed, well-nourished, and in no distress.  HENT:  Head: Normocephalic and atraumatic.  Right Ear: External ear normal.  Left Ear: External ear normal.  Nose: Nose normal.  Eyes: Conjunctivae are normal.  Neck: Neck supple.  Cardiovascular: Normal rate, regular rhythm, normal heart sounds and intact distal pulses.   Pulmonary/Chest: Effort normal and breath sounds normal.  Abdominal: Soft.  Musculoskeletal:  Left arm in sling.    Neurological: He is alert and oriented to person, place, and time. Gait normal.  Skin: Skin is warm and dry.  Psychiatric: Mood, memory, affect and judgment normal.    Assessment and Plan :  Hospital discharge follow-up  Other pulmonary embolism without acute cor pulmonale, unspecified chronicity ([place]) He was post surgery for ruptured biceps tendon but I'm a treat this as an unprovoked pulmonary embolus. We'll refer to hematology in the future.Anticoagulate for 4-6 months.more than 50% time spent in counseling regarding this issue. I have done the exam and reviewed the above chart and it is accurate to the best of my knowledge.  Miguel Aschoff MD [facility] 06/05/2015 11:33 AM

## 2015-06-09 ENCOUNTER — Telehealth: Payer: Self-pay | Admitting: Family Medicine

## 2015-06-09 NOTE — Telephone Encounter (Signed)
Looks like order is in but not worked on yet, can someone work on this today? patient states Dr. [name] wanted to have this set up by the end of this week. Please let me KEUV-HA

## 2015-06-09 NOTE — Telephone Encounter (Signed)
Pt stated that he was advised that if he hadn't heard from the Hematologist about his appt by today to call Dr. Alben Spittle nurse back. I wasn't able to see what the providers name that we are referring pt to. I advised pt that Judson Roch had been out of the office. Please advise. Thanks TNP

## 2015-06-26 DIAGNOSIS — M25612 Stiffness of left shoulder, not elsewhere classified: Secondary | ICD-10-CM | POA: Diagnosis not present

## 2015-06-26 DIAGNOSIS — M25512 Pain in left shoulder: Secondary | ICD-10-CM | POA: Diagnosis not present

## 2015-06-26 DIAGNOSIS — M6281 Muscle weakness (generalized): Secondary | ICD-10-CM | POA: Diagnosis not present

## 2015-06-28 ENCOUNTER — Inpatient Hospital Stay: Payer: BLUE CROSS/BLUE SHIELD | Attending: Internal Medicine | Admitting: Internal Medicine

## 2015-06-28 ENCOUNTER — Inpatient Hospital Stay: Payer: BLUE CROSS/BLUE SHIELD

## 2015-06-28 VITALS — BP 147/92 | HR 97 | Temp 98.9°F | Resp 18 | Wt 215.8 lb

## 2015-06-28 DIAGNOSIS — Z86718 Personal history of other venous thrombosis and embolism: Secondary | ICD-10-CM | POA: Insufficient documentation

## 2015-06-28 DIAGNOSIS — I2699 Other pulmonary embolism without acute cor pulmonale: Secondary | ICD-10-CM

## 2015-06-28 DIAGNOSIS — M129 Arthropathy, unspecified: Secondary | ICD-10-CM | POA: Insufficient documentation

## 2015-06-28 DIAGNOSIS — Z7901 Long term (current) use of anticoagulants: Secondary | ICD-10-CM | POA: Insufficient documentation

## 2015-06-28 DIAGNOSIS — Z808 Family history of malignant neoplasm of other organs or systems: Secondary | ICD-10-CM | POA: Diagnosis not present

## 2015-06-28 DIAGNOSIS — Z79899 Other long term (current) drug therapy: Secondary | ICD-10-CM

## 2015-06-28 LAB — COMPREHENSIVE METABOLIC PANEL
ALK PHOS: 41 U/L (ref 38–126)
ALT: 75 U/L — AB (ref 17–63)
AST: 33 U/L (ref 15–41)
Albumin: 4.8 g/dL (ref 3.5–5.0)
Anion gap: 5 (ref 5–15)
BILIRUBIN TOTAL: 1.1 mg/dL (ref 0.3–1.2)
BUN: 17 mg/dL (ref 6–20)
CALCIUM: 8.9 mg/dL (ref 8.9–10.3)
CHLORIDE: 106 mmol/L (ref 101–111)
CO2: 24 mmol/L (ref 22–32)
CREATININE: 0.89 mg/dL (ref 0.61–1.24)
Glucose, Bld: 93 mg/dL (ref 65–99)
Potassium: 3.8 mmol/L (ref 3.5–5.1)
Sodium: 135 mmol/L (ref 135–145)
TOTAL PROTEIN: 8.1 g/dL (ref 6.5–8.1)

## 2015-06-28 LAB — CBC WITH DIFFERENTIAL/PLATELET
BASOS ABS: 0.1 10*3/uL (ref 0–0.1)
Basophils Relative: 1 %
Eosinophils Absolute: 0.1 10*3/uL (ref 0–0.7)
Eosinophils Relative: 2 %
HEMATOCRIT: 48.2 % (ref 40.0–52.0)
HEMOGLOBIN: 16.6 g/dL (ref 13.0–18.0)
LYMPHS PCT: 37 %
Lymphs Abs: 2.6 10*3/uL (ref 1.0–3.6)
MCH: 30 pg (ref 26.0–34.0)
MCHC: 34.3 g/dL (ref 32.0–36.0)
MCV: 87.4 fL (ref 80.0–100.0)
Monocytes Absolute: 0.8 10*3/uL (ref 0.2–1.0)
Monocytes Relative: 11 %
NEUTROS ABS: 3.4 10*3/uL (ref 1.4–6.5)
Neutrophils Relative %: 49 %
Platelets: 194 10*3/uL (ref 150–440)
RBC: 5.51 MIL/uL (ref 4.40–5.90)
RDW: 12.8 % (ref 11.5–14.5)
WBC: 7 10*3/uL (ref 3.8–10.6)

## 2015-06-28 NOTE — Progress Notes (Signed)
[facility] CONSULT NOTE  Patient Care Team: Jerrol Banana., MD as PCP - General (Family Medicine)  CHIEF COMPLAINTS/PURPOSE OF CONSULTATION:   # MARCH 1st 2017-PE left lung- xarelto   # Left shoulder surgery [end of feb 2017]  HISTORY OF PRESENTING ILLNESS:  Tyler Mcclure 55 y.o.  male  Very pleasant with no prior history of thromboembolic events /  No history of smoking-  Had a left shoulder surgery in the end of February 2017. Few weeks later he noted to have stabbing pain in his left chest wall area. He was evaluated in emergency room the CT scan that showed a left  Basal segmental lower lobe pulmonary artery nonocclusive small PE.  Patient was started on heparin/  Transition to Xarelto.  Patient denies and swelling in the legs.  Had a colonoscopy few years ago.  Denies any family history of blood clots.   Patient  Admits to significant improvement of his left chest wall pain.  Denies any bleeding issues. Appetite is good.  No nausea no vomiting or diarrhea.  Denies any falls. Denies any blood in stools black stools.   ROS: A complete 10 point review of system is done which is negative except mentioned above in history of present illness  MEDICAL HISTORY:  Past Medical History  Diagnosis Date  . Arthritis     SURGICAL HISTORY: Past Surgical History  Procedure Laterality Date  . Biceps tendon repair    . Shoulder surgery    . Shoulder arthroscopy with rotator cuff repair and subacromial decompression Left 04/27/2015    Procedure: LEFT SHOULDER ARTHROSCOPY WITH DEBRIDEMENT, ACROMIOPLASTY, ROTATOR CUFF REPAIR;  Surgeon: [name], MD;  Location: [facility];  Service: Orthopedics;  Laterality: Left;  . Resection distal clavical Left 04/27/2015    Procedure: DISTAL CLAVICLE EXCISION;  Surgeon: [name], MD;  Location: [facility];  Service: Orthopedics;  Laterality: Left;    SOCIAL HISTORY: Social History   Social  History  . Marital Status: Married    Spouse Name: N/A  . Number of Children: N/A  . Years of Education: N/A   Occupational History  . Not on file.   Social History Main Topics  . Smoking status: Never Smoker   . Smokeless tobacco: Current User  . Alcohol Use: 0.0 oz/week    0 Standard drinks or equivalent per week     Comment: OCCASIONALLY  . Drug Use: No  . Sexual Activity: Not on file   Other Topics Concern  . Not on file   Social History Narrative    FAMILY HISTORY: Family History  Problem Relation Age of Onset  . Breast cancer Mother   . Hyperlipidemia Mother   . Pancreatic cancer Father   . Healthy Sister   . Healthy Daughter   . Healthy Son     ALLERGIES:  is allergic to mercury and penicillins.  MEDICATIONS:  Current Outpatient Prescriptions  Medication Sig Dispense Refill  . Cholecalciferol (VITAMIN D) 2000 UNITS CAPS Take 1 capsule by mouth daily.    . MULTIPLE VITAMIN PO Take 1 tablet by mouth daily.    . Omega-3 Fatty Acids (FISH OIL BURP-LESS) 1200 MG CAPS Take 2 capsules by mouth daily.    . rivaroxaban (XARELTO) 20 MG TABS tablet Take 1 tablet (20 mg total) by mouth daily with supper. 30 tablet 12  . vitamin E 400 UNIT capsule Take 1 capsule by mouth daily.  No current facility-administered medications for this visit.      Marland Kitchen  PHYSICAL EXAMINATION: ECOG PERFORMANCE STATUS: 0 - Asymptomatic  Filed Vitals:   06/28/15 1455  BP: 147/92  Pulse: 97  Temp: 98.9 F (37.2 C)  Resp: 18   Filed Weights   06/28/15 1455  Weight: 215 lb 13.3 oz (97.9 kg)    GENERAL: Well-nourished well-developed; Alert, no distress and comfortable.   With his wife.  EYES: no pallor or icterus OROPHARYNX: no thrush or ulceration; good dentition  NECK: supple, no masses felt LYMPH:  no palpable lymphadenopathy in the cervical, axillary or inguinal regions LUNGS: clear to auscultation and  No wheeze or crackles HEART/CVS: regular rate & rhythm and no murmurs;  No lower extremity edema ABDOMEN: abdomen soft, non-tender and normal bowel sounds Musculoskeletal:no cyanosis of digits and no clubbing  PSYCH: alert & oriented x 3 with fluent speech NEURO: no focal motor/sensory deficits SKIN:  no rashes or significant lesions  LABORATORY DATA:  I have reviewed the data as listed Lab Results  Component Value Date   WBC 11.7* 05/25/2015   HGB 15.9 05/25/2015   HCT 47.1 05/25/2015   MCV 87.8 05/25/2015   PLT 212 05/25/2015    Recent Labs  12/21/14 0917 05/24/15 2117 05/25/15 0551  NA 142 140 137  K 4.3 3.9 3.7  CL 102 105 104  CO2 23 25 23   GLUCOSE 89 112* 126*  BUN 12 15 16   CREATININE 0.86 0.96 0.96  CALCIUM 9.6 9.4 8.8*  GFRNONAA 99 >60 >60  GFRAA 114 >60 >60  PROT 7.4  --   --   ALBUMIN 5.0  --   --   AST 28  --   --   ALT 50*  --   --   ALKPHOS 41  --   --   BILITOT 1.6*  --   --      ASSESSMENT & PLAN:   #  Likely unprovoked PE left lung-  After of shoulder surgery.  Currently on Xarelto-  Tolerating without any difficulties.  Long discussion regarding the  Multiple causes of  PE-  Including  Genetic vs acquired causes.  Recommend factor V Leiden and prothrombin gene mutation at this time.  In general  I would recommend  Anticoagulation anywhere between  6 to  12 months.  Patient is leaning towards shorter-term anti- coagulation.  #  Long discussion regarding  Safety/ potential side effects of Xarelto.  He is aware that that is no antidote.  Discussed  General precautions while on Anti-  Coagulation.  #  Will call the patient  The results of factor V Leiden and prothrombin gene mutation when available.  Otherwise patient will  Follow-up with me in approximately 4 months with labs.  Will also order CBC CMP today.  Thank you Dr. [name] for allowing me to participate in the care of your pleasant patient. Please do not hesitate to contact me with questions or concerns in the interim.  # 30 minutes face-to-face with the patient  discussing the above plan of care; more than 50% of time spent on counseling and coordination.    Cammie Sickle, MD 06/28/2015 3:04 PM

## 2015-06-29 DIAGNOSIS — M25612 Stiffness of left shoulder, not elsewhere classified: Secondary | ICD-10-CM | POA: Diagnosis not present

## 2015-06-29 DIAGNOSIS — M6281 Muscle weakness (generalized): Secondary | ICD-10-CM | POA: Diagnosis not present

## 2015-06-29 DIAGNOSIS — M25512 Pain in left shoulder: Secondary | ICD-10-CM | POA: Diagnosis not present

## 2015-07-03 DIAGNOSIS — M25612 Stiffness of left shoulder, not elsewhere classified: Secondary | ICD-10-CM | POA: Diagnosis not present

## 2015-07-03 DIAGNOSIS — M6281 Muscle weakness (generalized): Secondary | ICD-10-CM | POA: Diagnosis not present

## 2015-07-03 DIAGNOSIS — M25512 Pain in left shoulder: Secondary | ICD-10-CM | POA: Diagnosis not present

## 2015-07-06 DIAGNOSIS — M25612 Stiffness of left shoulder, not elsewhere classified: Secondary | ICD-10-CM | POA: Diagnosis not present

## 2015-07-06 DIAGNOSIS — M6281 Muscle weakness (generalized): Secondary | ICD-10-CM | POA: Diagnosis not present

## 2015-07-06 DIAGNOSIS — M25512 Pain in left shoulder: Secondary | ICD-10-CM | POA: Diagnosis not present

## 2015-07-06 LAB — FACTOR 5 LEIDEN

## 2015-07-06 LAB — PROTHROMBIN GENE MUTATION

## 2015-07-07 ENCOUNTER — Telehealth: Payer: Self-pay | Admitting: *Deleted

## 2015-07-07 NOTE — Telephone Encounter (Signed)
Called and left vm for patient to call back to discuss his lab results.

## 2015-07-07 NOTE — Telephone Encounter (Signed)
-----   Message from Cammie Sickle, MD sent at 07/06/2015  4:46 PM EDT ----- Please inform patient that his prothrombin gene mutation/factor V Leiden- are normal; hence he does not have any genetic risk factors for blood clots. Follow-up as planned.

## 2015-07-07 NOTE — Telephone Encounter (Signed)
Pt called back. Results discussed with patient. Explained that his lab tests did not demonstrate any signs of genetic factors that would contribute to his h/o blood clots. He was instructed to keep all of his future appts with Dr. Rogue Bussing.  Teach back process performed.

## 2015-07-10 DIAGNOSIS — M25512 Pain in left shoulder: Secondary | ICD-10-CM | POA: Diagnosis not present

## 2015-07-10 DIAGNOSIS — M6281 Muscle weakness (generalized): Secondary | ICD-10-CM | POA: Diagnosis not present

## 2015-07-10 DIAGNOSIS — M25612 Stiffness of left shoulder, not elsewhere classified: Secondary | ICD-10-CM | POA: Diagnosis not present

## 2015-07-13 DIAGNOSIS — M25512 Pain in left shoulder: Secondary | ICD-10-CM | POA: Diagnosis not present

## 2015-07-13 DIAGNOSIS — M6281 Muscle weakness (generalized): Secondary | ICD-10-CM | POA: Diagnosis not present

## 2015-07-13 DIAGNOSIS — M25612 Stiffness of left shoulder, not elsewhere classified: Secondary | ICD-10-CM | POA: Diagnosis not present

## 2015-07-17 DIAGNOSIS — M25512 Pain in left shoulder: Secondary | ICD-10-CM | POA: Diagnosis not present

## 2015-07-17 DIAGNOSIS — M6281 Muscle weakness (generalized): Secondary | ICD-10-CM | POA: Diagnosis not present

## 2015-07-17 DIAGNOSIS — M25612 Stiffness of left shoulder, not elsewhere classified: Secondary | ICD-10-CM | POA: Diagnosis not present

## 2015-07-18 DIAGNOSIS — M25572 Pain in left ankle and joints of left foot: Secondary | ICD-10-CM | POA: Diagnosis not present

## 2015-07-20 DIAGNOSIS — M25612 Stiffness of left shoulder, not elsewhere classified: Secondary | ICD-10-CM | POA: Diagnosis not present

## 2015-07-20 DIAGNOSIS — M25512 Pain in left shoulder: Secondary | ICD-10-CM | POA: Diagnosis not present

## 2015-07-20 DIAGNOSIS — M6281 Muscle weakness (generalized): Secondary | ICD-10-CM | POA: Diagnosis not present

## 2015-09-04 ENCOUNTER — Ambulatory Visit (INDEPENDENT_AMBULATORY_CARE_PROVIDER_SITE_OTHER): Payer: BLUE CROSS/BLUE SHIELD | Admitting: Sports Medicine

## 2015-09-04 ENCOUNTER — Encounter: Payer: Self-pay | Admitting: Sports Medicine

## 2015-09-04 VITALS — BP 125/101 | HR 106 | Ht 70.0 in | Wt 215.0 lb

## 2015-09-04 DIAGNOSIS — M722 Plantar fascial fibromatosis: Secondary | ICD-10-CM

## 2015-09-05 NOTE — Progress Notes (Signed)
   Subjective:    Patient ID: Tyler Mcclure, male    DOB: March 16, 1961, 55 y.o.   MRN: [id number]  HPI chief complaint: Left heel pain  Tyler Mcclure is a very pleasant 55 year old male who comes in today complaining of several weeks of left heel pain. No trauma that he can recall but a gradual onset of pain that he localizes only to his left heel. It is worse with first step in the morning as well as first step after sitting for long periods of time. He denies any radiating pain. He denies any numbness or tingling. No nighttime pain. No specific treatment to date. No history of plantar fasciitis in the past. He has not noticed any swelling.  Past medical history and surgical history are reviewed. Surgical history is significant for a recent left shoulder rotator cuff repair done by Dr. Percell Miller in February. His postoperative period was complicated by a pulmonary embolus and he is currently on Xarelto. Medications reviewed Allergies reviewed    Review of Systems    as above Objective:   Physical Exam  Well-developed, well-nourished. No acute distress. Awake alert and oriented 3. Vital signs reviewed  Left heel: Patient has tenderness to palpation at the origin of the plantar fascia. Negative calcaneal squeeze. No soft tissue swelling. Fairly well-preserved longitudinal arch. No other bony or soft tissue tenderness to palpation. Negative tinels at the tarsal tunnel. Neurovascularly intact distally. He does limp slightly when first getting off of the exam table.  MSK ultrasound of his left heel was performed. Limited images were obtained. Patient has thickening of the plantar fascia which measures greater than 0.5 cm. He also has hypoechoic changes within the origin of the plantar fascia consistent with plantar fasciitis. No obvious heel spur is seen.      Assessment & Plan:   Left heel pain secondary to plantar fasciitis Status post left shoulder rotator cuff repair done by Dr. Percell Miller in  February Postoperative pulmonary embolus  I discussed the patient's treatment options with him. He would like to try a [employer] while sleeping. I will also have him use an arch strap. He has started doing some plantar fascial stretches and I've asked that he continue those daily. He will begin icing daily as well. I considered orthotics but he has some fairly comfortable soft insoles in his shoes so I'll defer that for now. He'll follow-up with me in 4 weeks for reevaluation. He may continue with activity as tolerated.

## 2015-10-02 ENCOUNTER — Ambulatory Visit (INDEPENDENT_AMBULATORY_CARE_PROVIDER_SITE_OTHER): Payer: BLUE CROSS/BLUE SHIELD | Admitting: Family Medicine

## 2015-10-02 ENCOUNTER — Encounter: Payer: Self-pay | Admitting: Family Medicine

## 2015-10-02 ENCOUNTER — Ambulatory Visit: Payer: BLUE CROSS/BLUE SHIELD | Admitting: Sports Medicine

## 2015-10-02 VITALS — BP 118/72 | HR 72 | Temp 98.5°F | Resp 14 | Wt 218.0 lb

## 2015-10-02 DIAGNOSIS — K648 Other hemorrhoids: Secondary | ICD-10-CM | POA: Diagnosis not present

## 2015-10-02 DIAGNOSIS — E785 Hyperlipidemia, unspecified: Secondary | ICD-10-CM

## 2015-10-02 DIAGNOSIS — R5383 Other fatigue: Secondary | ICD-10-CM

## 2015-10-02 DIAGNOSIS — I2699 Other pulmonary embolism without acute cor pulmonale: Secondary | ICD-10-CM | POA: Diagnosis not present

## 2015-10-02 DIAGNOSIS — K644 Residual hemorrhoidal skin tags: Secondary | ICD-10-CM

## 2015-10-02 MED ORDER — HYDROCORTISONE 2.5 % RE CREA: 1.0000 "application " | TOPICAL_CREAM | Freq: Two times a day (BID) | RECTAL | Status: DC

## 2015-10-02 MED ORDER — RIVAROXABAN 20 MG PO TABS: 20.0000 mg | ORAL_TABLET | Freq: Every day | ORAL | Status: DC

## 2015-10-02 NOTE — Progress Notes (Signed)
Subjective:  HPI  Patient is here for follow up from March office visit. He was treated for pulmonary embolism and was started on Xarelto. He has noticed that he is lethargic since been on this medication but no other issues. He did see hematologist after his last visit with [place] and they did labs. He is to follow up in august.   Prior to Admission medications   Medication Sig Start Date End Date Taking? Authorizing Provider  Cholecalciferol (VITAMIN D) 2000 UNITS CAPS Take 1 capsule by mouth daily.   Yes Historical Provider, MD  MULTIPLE VITAMIN PO Take 1 tablet by mouth daily.   Yes Historical Provider, MD  Omega-3 Fatty Acids (FISH OIL BURP-LESS) 1200 MG CAPS Take 2 capsules by mouth daily.   Yes Historical Provider, MD  rivaroxaban (XARELTO) 20 MG TABS tablet Take 1 tablet (20 mg total) by mouth daily with supper. 06/05/15  Yes Micai Apolinar Maceo Pro., MD  vitamin E 400 UNIT capsule Take 1 capsule by mouth daily.   Yes Historical Provider, MD    Patient Active Problem List   Diagnosis Date Noted  . Pulmonary embolus ([place]) 05/24/2015  . Arthritis 05/24/2015  . Rupture of tendon of biceps, long head 11/25/2014  . Fecal occult blood test positive 11/25/2014  . Squamous cell carcinoma of skin 11/25/2014    Past Medical History  Diagnosis Date  . Arthritis     Social History   Social History  . Marital Status: Married    Spouse Name: N/A  . Number of Children: N/A  . Years of Education: N/A   Occupational History  . Not on file.   Social History Main Topics  . Smoking status: Never Smoker   . Smokeless tobacco: Current User  . Alcohol Use: 0.0 oz/week    0 Standard drinks or equivalent per week     Comment: OCCASIONALLY  . Drug Use: No  . Sexual Activity: Not on file   Other Topics Concern  . Not on file   Social History Narrative    Allergies  Allergen Reactions  . Mercury   . Penicillins     Review of Systems  Constitutional: Positive for  malaise/fatigue.  Respiratory: Negative.   Cardiovascular: Negative.   Musculoskeletal: Positive for myalgias and joint pain.  Neurological: Negative.   Endo/Heme/Allergies: Bruises/bleeds easily (since been on Xarelto).    Immunization History  Administered Date(s) Administered  . Tdap 11/24/2008   Objective:  BP 118/72 mmHg  Pulse 72  Temp(Src) 98.5 F (36.9 C)  Resp 14  Wt 218 lb (98.884 kg)  Physical Exam  Constitutional: He is oriented to person, place, and time and well-developed, well-nourished, and in no distress.  HENT:  Head: Normocephalic and atraumatic.  Right Ear: External ear normal.  Left Ear: External ear normal.  Nose: Nose normal.  Eyes: Conjunctivae are normal. Pupils are equal, round, and reactive to light.  Neck: Normal range of motion. Neck supple.  Cardiovascular: Normal rate, regular rhythm, normal heart sounds and intact distal pulses.   No murmur heard. Pulmonary/Chest: Effort normal and breath sounds normal. No respiratory distress. He has no wheezes.  Musculoskeletal: He exhibits no edema or tenderness.  Neurological: He is alert and oriented to person, place, and time.  Skin: Skin is warm and dry.  Psychiatric: Mood, memory, affect and judgment normal.    Lab Results  Component Value Date   WBC 7.0 06/28/2015   HGB 16.6 06/28/2015   HCT 48.2 06/28/2015  PLT 194 06/28/2015   GLUCOSE 93 06/28/2015   CHOL 215* 12/21/2014   TRIG 219* 12/21/2014   HDL 50 12/21/2014   LDLCALC 121* 12/21/2014   TSH 3.120 12/21/2014   INR 0.97 05/24/2015    CMP     Component Value Date/Time   NA 135 06/28/2015 1537   NA 142 12/21/2014 0917   K 3.8 06/28/2015 1537   CL 106 06/28/2015 1537   CO2 24 06/28/2015 1537   GLUCOSE 93 06/28/2015 1537   GLUCOSE 89 12/21/2014 0917   BUN 17 06/28/2015 1537   BUN 12 12/21/2014 0917   CREATININE 0.89 06/28/2015 1537   CALCIUM 8.9 06/28/2015 1537   PROT 8.1 06/28/2015 1537   PROT 7.4 12/21/2014 0917   ALBUMIN  4.8 06/28/2015 1537   ALBUMIN 5.0 12/21/2014 0917   AST 33 06/28/2015 1537   ALT 75* 06/28/2015 1537   ALKPHOS 41 06/28/2015 1537   BILITOT 1.1 06/28/2015 1537   BILITOT 1.6* 12/21/2014 0917   GFRNONAA >60 06/28/2015 1537   GFRAA >60 06/28/2015 1537    Assessment and Plan :  1. Other pulmonary embolism without acute cor pulmonale, unspecified chronicity ([place]) Will check labs today. Advised patient to keep follow up appointment with hematologist. Refill provided for Xarelto.Finished 6 months treatment which patient prefers over longer treatment options - CBC with Differential/Platelet  2. Other fatigue Will check labs. Advised patient to ask his wife if he snores at night or stops breathing at night. May be sleep apnea issue but per patient felt fatigue since been on Xarelto. - CBC with Differential/Platelet - TSH  3. Hyperlipidemia - Comprehensive metabolic panel - Lipid Panel With LDL/HDL Ratio 4. History of squamous cell skin cancer Patient was seen and examined by Dr. Eulas Post and note was scribed by Theressa Millard, RMA.  Miguel Aschoff MD [facility] 10/02/2015 8:32 AM

## 2015-10-03 LAB — CBC WITH DIFFERENTIAL/PLATELET
BASOS: 1 %
Basophils Absolute: 0 10*3/uL (ref 0.0–0.2)
EOS (ABSOLUTE): 0.1 10*3/uL (ref 0.0–0.4)
EOS: 3 %
HEMATOCRIT: 49.9 % (ref 37.5–51.0)
HEMOGLOBIN: 17.2 g/dL (ref 12.6–17.7)
Immature Grans (Abs): 0 10*3/uL (ref 0.0–0.1)
Immature Granulocytes: 0 %
LYMPHS ABS: 1.9 10*3/uL (ref 0.7–3.1)
Lymphs: 34 %
MCH: 30.5 pg (ref 26.6–33.0)
MCHC: 34.5 g/dL (ref 31.5–35.7)
MCV: 89 fL (ref 79–97)
MONOCYTES: 9 %
Monocytes Absolute: 0.5 10*3/uL (ref 0.1–0.9)
NEUTROS ABS: 3 10*3/uL (ref 1.4–7.0)
Neutrophils: 53 %
Platelets: 237 10*3/uL (ref 150–379)
RBC: 5.64 x10E6/uL (ref 4.14–5.80)
RDW: 13.3 % (ref 12.3–15.4)
WBC: 5.6 10*3/uL (ref 3.4–10.8)

## 2015-10-03 LAB — LIPID PANEL WITH LDL/HDL RATIO
Cholesterol, Total: 236 mg/dL — ABNORMAL HIGH (ref 100–199)
HDL: 54 mg/dL (ref >39)
LDL Calculated: 136 mg/dL — ABNORMAL HIGH (ref 0–99)
LDL/HDL RATIO: 2.5 ratio (ref 0.0–3.6)
Triglycerides: 231 mg/dL — ABNORMAL HIGH (ref 0–149)
VLDL Cholesterol Cal: 46 mg/dL — ABNORMAL HIGH (ref 5–40)

## 2015-10-03 LAB — COMPREHENSIVE METABOLIC PANEL
A/G RATIO: 2 (ref 1.2–2.2)
ALBUMIN: 4.9 g/dL (ref 3.5–5.5)
ALT: 71 IU/L — AB (ref 0–44)
AST: 33 IU/L (ref 0–40)
Alkaline Phosphatase: 48 IU/L (ref 39–117)
BUN / CREAT RATIO: 15 (ref 9–20)
BUN: 14 mg/dL (ref 6–24)
Bilirubin Total: 1.3 mg/dL — ABNORMAL HIGH (ref 0.0–1.2)
CALCIUM: 9.8 mg/dL (ref 8.7–10.2)
CO2: 22 mmol/L (ref 18–29)
Chloride: 102 mmol/L (ref 96–106)
Creatinine, Ser: 0.96 mg/dL (ref 0.76–1.27)
GFR, EST AFRICAN AMERICAN: 103 mL/min/{1.73_m2} (ref >59)
GFR, EST NON AFRICAN AMERICAN: 89 mL/min/{1.73_m2} (ref >59)
GLOBULIN, TOTAL: 2.5 g/dL (ref 1.5–4.5)
Glucose: 91 mg/dL (ref 65–99)
POTASSIUM: 4.8 mmol/L (ref 3.5–5.2)
Sodium: 143 mmol/L (ref 134–144)
TOTAL PROTEIN: 7.4 g/dL (ref 6.0–8.5)

## 2015-10-03 LAB — TSH: TSH: 4.07 u[IU]/mL (ref 0.450–4.500)

## 2015-10-05 NOTE — Progress Notes (Signed)
Advised  ED 

## 2015-10-26 DIAGNOSIS — Q825 Congenital non-neoplastic nevus: Secondary | ICD-10-CM | POA: Diagnosis not present

## 2015-10-26 DIAGNOSIS — D485 Neoplasm of uncertain behavior of skin: Secondary | ICD-10-CM | POA: Diagnosis not present

## 2015-10-30 ENCOUNTER — Inpatient Hospital Stay: Payer: BLUE CROSS/BLUE SHIELD

## 2015-10-30 ENCOUNTER — Inpatient Hospital Stay: Payer: BLUE CROSS/BLUE SHIELD | Attending: Internal Medicine | Admitting: Internal Medicine

## 2015-10-30 DIAGNOSIS — Z800 Family history of malignant neoplasm of digestive organs: Secondary | ICD-10-CM | POA: Insufficient documentation

## 2015-10-30 DIAGNOSIS — I2699 Other pulmonary embolism without acute cor pulmonale: Secondary | ICD-10-CM

## 2015-10-30 DIAGNOSIS — Z86711 Personal history of pulmonary embolism: Secondary | ICD-10-CM | POA: Diagnosis not present

## 2015-10-30 DIAGNOSIS — M129 Arthropathy, unspecified: Secondary | ICD-10-CM | POA: Diagnosis not present

## 2015-10-30 DIAGNOSIS — Z7901 Long term (current) use of anticoagulants: Secondary | ICD-10-CM | POA: Diagnosis not present

## 2015-10-30 DIAGNOSIS — Z803 Family history of malignant neoplasm of breast: Secondary | ICD-10-CM | POA: Diagnosis not present

## 2015-10-30 LAB — CBC WITH DIFFERENTIAL/PLATELET
Basophils Absolute: 0 10*3/uL (ref 0–0.1)
Basophils Relative: 1 %
EOS ABS: 0.1 10*3/uL (ref 0–0.7)
Eosinophils Relative: 1 %
HCT: 47.4 % (ref 40.0–52.0)
HEMOGLOBIN: 16.4 g/dL (ref 13.0–18.0)
LYMPHS ABS: 2.1 10*3/uL (ref 1.0–3.6)
Lymphocytes Relative: 34 %
MCH: 30.4 pg (ref 26.0–34.0)
MCHC: 34.5 g/dL (ref 32.0–36.0)
MCV: 87.9 fL (ref 80.0–100.0)
MONOS PCT: 11 %
Monocytes Absolute: 0.7 10*3/uL (ref 0.2–1.0)
NEUTROS PCT: 53 %
Neutro Abs: 3.2 10*3/uL (ref 1.4–6.5)
PLATELETS: 198 10*3/uL (ref 150–440)
RBC: 5.39 MIL/uL (ref 4.40–5.90)
RDW: 12.9 % (ref 11.5–14.5)
WBC: 6.1 10*3/uL (ref 3.8–10.6)

## 2015-10-30 LAB — BASIC METABOLIC PANEL
ANION GAP: 8 (ref 5–15)
BUN: 14 mg/dL (ref 6–20)
CHLORIDE: 104 mmol/L (ref 101–111)
CO2: 25 mmol/L (ref 22–32)
Calcium: 8.7 mg/dL — ABNORMAL LOW (ref 8.9–10.3)
Creatinine, Ser: 0.99 mg/dL (ref 0.61–1.24)
Glucose, Bld: 101 mg/dL — ABNORMAL HIGH (ref 65–99)
POTASSIUM: 3.8 mmol/L (ref 3.5–5.1)
SODIUM: 137 mmol/L (ref 135–145)

## 2015-10-30 NOTE — Assessment & Plan Note (Addendum)
#    Likely unprovoked PE left lung-  After of shoulder surgery.    Factor V lediden/Prothrombin gene- NEGATIVE.  Currently on Xarelto-  Tolerating without any difficulties. Finish 3 more week; total of 6 months of therapy.   # recommend Asprin 81 mg/day 2-3 days post xarelto. Aspirin mainly for primary prevention of cardiovascular events.   # recommend follow as needed.

## 2015-10-30 NOTE — Progress Notes (Signed)
[place] NOTE  Patient Care Team: Jerrol Banana., MD as PCP - General (Family Medicine)  CHIEF COMPLAINTS/PURPOSE OF CONSULTATION:   # MARCH 1st 2017-PE left lung- xarelto   # Left shoulder surgery [end of feb 2017]  HISTORY OF PRESENTING ILLNESS:  Tyler Mcclure 55 y.o.  male  With  Above prior hx of PE on xarelto is here for follow up.   Denies any bleeding problems. Denies any repeated blood clots.  Patient  Admits to significant improvement of his left chest wall pain.  Denies any bleeding issues. Appetite is good.  No nausea no vomiting or diarrhea.  He complains he complains of ongoing fatigue.   ROS: A complete 10 point review of system is done which is negative except mentioned above in history of present illness  MEDICAL HISTORY:  Past Medical History:  Diagnosis Date  . Arthritis     SURGICAL HISTORY: Past Surgical History:  Procedure Laterality Date  . BICEPS TENDON REPAIR    . RESECTION DISTAL CLAVICAL Left 04/27/2015   Procedure: DISTAL CLAVICLE EXCISION;  Surgeon: [name], MD;  Location: [place];  Service: Orthopedics;  Laterality: Left;  . SHOULDER ARTHROSCOPY WITH ROTATOR CUFF REPAIR AND SUBACROMIAL DECOMPRESSION Left 04/27/2015   Procedure: LEFT SHOULDER ARTHROSCOPY WITH DEBRIDEMENT, ACROMIOPLASTY, ROTATOR CUFF REPAIR;  Surgeon: [name], MD;  Location: [place];  Service: Orthopedics;  Laterality: Left;  . SHOULDER SURGERY      SOCIAL HISTORY: Social History   Social History  . Marital status: Married    Spouse name: N/A  . Number of children: N/A  . Years of education: N/A   Occupational History  . Not on file.   Social History Main Topics  . Smoking status: Never Smoker  . Smokeless tobacco: Current User  . Alcohol use 0.0 oz/week     Comment: OCCASIONALLY  . Drug use: No  . Sexual activity: Not on file   Other Topics Concern  . Not on file   Social History  Narrative  . No narrative on file    FAMILY HISTORY: Family History  Problem Relation Age of Onset  . Breast cancer Mother   . Hyperlipidemia Mother   . Pancreatic cancer Father   . Healthy Sister   . Healthy Daughter   . Healthy Son     ALLERGIES:  is allergic to mercury and penicillins.  MEDICATIONS:  Current Outpatient Prescriptions  Medication Sig Dispense Refill  . Cholecalciferol (VITAMIN D) 2000 UNITS CAPS Take 1 capsule by mouth daily.    . hydrocortisone (ANUSOL-HC) 2.5 % rectal cream Place 1 application rectally 2 (two) times daily. 30 g 2  . MULTIPLE VITAMIN PO Take 1 tablet by mouth daily.    . Omega-3 Fatty Acids (FISH OIL BURP-LESS) 1200 MG CAPS Take 2 capsules by mouth daily.    . rivaroxaban (XARELTO) 20 MG TABS tablet Take 1 tablet (20 mg total) by mouth daily with supper. 30 tablet 12  . vitamin E 400 UNIT capsule Take 1 capsule by mouth daily.     No current facility-administered medications for this visit.       Marland Kitchen  PHYSICAL EXAMINATION: ECOG PERFORMANCE STATUS: 0 - Asymptomatic  Vitals:   10/30/15 1453  BP: (!) 148/84  Pulse: 92  Resp: 18  Temp: 98.9 F (37.2 C)   Filed Weights   10/30/15 1453  Weight: 213 lb 6 oz (96.8 kg)  GENERAL: Well-nourished well-developed; Alert, no distress and comfortable.   With his wife.  EYES: no pallor or icterus OROPHARYNX: no thrush or ulceration; good dentition  NECK: supple, no masses felt LYMPH:  no palpable lymphadenopathy in the cervical, axillary or inguinal regions LUNGS: clear to auscultation and  No wheeze or crackles HEART/CVS: regular rate & rhythm and no murmurs; No lower extremity edema ABDOMEN: abdomen soft, non-tender and normal bowel sounds Musculoskeletal:no cyanosis of digits and no clubbing  PSYCH: alert & oriented x 3 with fluent speech NEURO: no focal motor/sensory deficits SKIN:  no rashes or significant lesions  LABORATORY DATA:  I have reviewed the data as listed Lab Results   Component Value Date   WBC 6.1 10/30/2015   HGB 16.4 10/30/2015   HCT 47.4 10/30/2015   MCV 87.9 10/30/2015   PLT 198 10/30/2015    Recent Labs  12/21/14 0917  06/28/15 1537 10/02/15 0912 10/30/15 1431  NA 142  < > 135 143 137  K 4.3  < > 3.8 4.8 3.8  CL 102  < > 106 102 104  CO2 23  < > 24 22 25   GLUCOSE 89  < > 93 91 101*  BUN 12  < > 17 14 14   CREATININE 0.86  < > 0.89 0.96 0.99  CALCIUM 9.6  < > 8.9 9.8 8.7*  GFRNONAA 99  < > >60 89 >60  GFRAA 114  < > >60 103 >60  PROT 7.4  --  8.1 7.4  --   ALBUMIN 5.0  --  4.8 4.9  --   AST 28  --  33 33  --   ALT 50*  --  75* 71*  --   ALKPHOS 41  --  41 48  --   BILITOT 1.6*  --  1.1 1.3*  --   < > = values in this interval not displayed.   ASSESSMENT & PLAN:   Left pulmonary embolus ([place]) #  Likely unprovoked PE left lung-  After of shoulder surgery.    Factor V lediden/Prothrombin gene- NEGATIVE.  Currently on Xarelto-  Tolerating without any difficulties. Finish 3 more week; total of 6 months of therapy.   # recommend Asprin 81 mg/day 2-3 days post xarelto. Aspirin mainly for primary prevention of cardiovascular events.   # recommend follow as needed.      Cammie Sickle, MD 10/30/2015 4:52 PM

## 2015-11-30 ENCOUNTER — Encounter: Payer: Self-pay | Admitting: Family Medicine

## 2015-11-30 ENCOUNTER — Ambulatory Visit (INDEPENDENT_AMBULATORY_CARE_PROVIDER_SITE_OTHER): Payer: BLUE CROSS/BLUE SHIELD | Admitting: Family Medicine

## 2015-11-30 VITALS — BP 112/74 | HR 94 | Temp 98.3°F | Resp 18 | Wt 210.0 lb

## 2015-11-30 DIAGNOSIS — J3489 Other specified disorders of nose and nasal sinuses: Secondary | ICD-10-CM

## 2015-11-30 DIAGNOSIS — J309 Allergic rhinitis, unspecified: Secondary | ICD-10-CM | POA: Diagnosis not present

## 2015-11-30 MED ORDER — LORATADINE 10 MG PO TABS: 10.0000 mg | ORAL_TABLET | Freq: Every day | ORAL | 12 refills | Status: DC

## 2015-11-30 MED ORDER — FLUTICASONE PROPIONATE 50 MCG/ACT NA SUSP: 2.0000 | Freq: Every day | NASAL | 12 refills | Status: DC

## 2015-11-30 NOTE — Progress Notes (Signed)
Subjective:  HPI Pt is here today for a possible sinus infection. He reports that he has a burning an tingling on both sides of his under eye area and pressure around his nose. He does have some sinus congestion but no cough, sore throat or any other cold symptoms. He reports that he read that Xarelto could have sinus irritation as a side effect and he just stopped taking it about 2 weeks ago and this started about a month ago. He has been trying Benadryl and only helped slightly.  Prior to Admission medications   Medication Sig Start Date End Date Taking? Authorizing Provider  Cholecalciferol (VITAMIN D) 2000 UNITS CAPS Take 1 capsule by mouth daily.    Historical Provider, MD  hydrocortisone (ANUSOL-HC) 2.5 % rectal cream Place 1 application rectally 2 (two) times daily. 10/02/15   Richard Maceo Pro., MD  MULTIPLE VITAMIN PO Take 1 tablet by mouth daily.    Historical Provider, MD  Omega-3 Fatty Acids (FISH OIL BURP-LESS) 1200 MG CAPS Take 2 capsules by mouth daily.    Historical Provider, MD  rivaroxaban (XARELTO) 20 MG TABS tablet Take 1 tablet (20 mg total) by mouth daily with supper. 10/02/15   Jerrol Banana., MD  vitamin E 400 UNIT capsule Take 1 capsule by mouth daily.    Historical Provider, MD    Patient Active Problem List   Diagnosis Date Noted  . Left pulmonary embolus ([place]) 10/30/2015  . Pulmonary embolus ([place]) 05/24/2015  . Arthritis 05/24/2015  . Rupture of tendon of biceps, long head 11/25/2014  . Fecal occult blood test positive 11/25/2014  . Squamous cell carcinoma of skin 11/25/2014    Past Medical History:  Diagnosis Date  . Arthritis     Social History   Social History  . Marital status: Married    Spouse name: N/A  . Number of children: N/A  . Years of education: N/A   Occupational History  . Not on file.   Social History Main Topics  . Smoking status: Never Smoker  . Smokeless tobacco: Current User  . Alcohol use 0.0 oz/week   Comment: OCCASIONALLY  . Drug use: No  . Sexual activity: Not on file   Other Topics Concern  . Not on file   Social History Narrative  . No narrative on file    Allergies  Allergen Reactions  . Mercury   . Penicillins     Review of Systems  Constitutional: Negative.   HENT: Positive for congestion.        Sinus pressure  Eyes: Negative.   Respiratory: Negative.   Cardiovascular: Negative.   Gastrointestinal: Negative.   Genitourinary: Negative.   Musculoskeletal: Negative.   Neurological: Negative.   Endo/Heme/Allergies: Negative.   Psychiatric/Behavioral: Negative.     Immunization History  Administered Date(s) Administered  . Tdap 11/24/2008   Objective:  BP 112/74 (BP Location: Left Arm, Patient Position: Sitting, Cuff Size: Large)   Pulse 94   Temp 98.3 F (36.8 C) (Oral)   Resp 18   Wt 210 lb (95.3 kg)   SpO2 98%   BMI 30.13 kg/m   Physical Exam  Constitutional: He is oriented to person, place, and time and well-developed, well-nourished, and in no distress.  HENT:  Head: Normocephalic and atraumatic.  Right Ear: External ear normal.  Left Ear: External ear normal.  Nose: Nose normal.  Mouth/Throat: Oropharynx is clear and moist.  Eyes: Conjunctivae and EOM are normal. Pupils are equal,  round, and reactive to light.  Neck: Normal range of motion. Neck supple.  Cardiovascular: Normal rate, regular rhythm, normal heart sounds and intact distal pulses.   Pulmonary/Chest: Effort normal and breath sounds normal.  Musculoskeletal: Normal range of motion.  Neurological: He is alert and oriented to person, place, and time. He has normal reflexes. Gait normal. GCS score is 15.  Skin: Skin is warm and dry.  Psychiatric: Mood, memory, affect and judgment normal.    Lab Results  Component Value Date   WBC 6.1 10/30/2015   HGB 16.4 10/30/2015   HCT 47.4 10/30/2015   PLT 198 10/30/2015   GLUCOSE 101 (H) 10/30/2015   CHOL 236 (H) 10/02/2015   TRIG 231  (H) 10/02/2015   HDL 54 10/02/2015   LDLCALC 136 (H) 10/02/2015   TSH 4.070 10/02/2015   INR 0.97 05/24/2015    CMP     Component Value Date/Time   NA 137 10/30/2015 1431   NA 143 10/02/2015 0912   K 3.8 10/30/2015 1431   CL 104 10/30/2015 1431   CO2 25 10/30/2015 1431   GLUCOSE 101 (H) 10/30/2015 1431   BUN 14 10/30/2015 1431   BUN 14 10/02/2015 0912   CREATININE 0.99 10/30/2015 1431   CALCIUM 8.7 (L) 10/30/2015 1431   PROT 7.4 10/02/2015 0912   ALBUMIN 4.9 10/02/2015 0912   AST 33 10/02/2015 0912   ALT 71 (H) 10/02/2015 0912   ALKPHOS 48 10/02/2015 0912   BILITOT 1.3 (H) 10/02/2015 0912   GFRNONAA >60 10/30/2015 1431   GFRAA >60 10/30/2015 1431    Assessment and Plan :  1. Allergic rhinitis, unspecified allergic rhinitis type  - fluticasone (FLONASE) 50 MCG/ACT nasal spray; Place 2 sprays into both nostrils daily.  Dispense: 16 g; Refill: 12 - loratadine (CLARITIN) 10 MG tablet; Take 1 tablet (10 mg total) by mouth daily.  Dispense: 30 tablet; Refill: 12  2. Sinus pain  - Ambulatory referral to ENT 3.PE  HPI, Exam, and A&P Transcribed under the direction and in the presence of Richard L. Cranford Mon, MD  Electronically Signed: Webb Laws, [place] MD [facility] 11/30/2015 2:37 PM

## 2015-12-02 ENCOUNTER — Ambulatory Visit: Payer: BLUE CROSS/BLUE SHIELD | Admitting: Family Medicine

## 2015-12-06 DIAGNOSIS — J301 Allergic rhinitis due to pollen: Secondary | ICD-10-CM | POA: Diagnosis not present

## 2015-12-06 DIAGNOSIS — H1045 Other chronic allergic conjunctivitis: Secondary | ICD-10-CM | POA: Diagnosis not present

## 2015-12-26 ENCOUNTER — Encounter: Payer: Self-pay | Admitting: Family Medicine

## 2015-12-26 ENCOUNTER — Ambulatory Visit (INDEPENDENT_AMBULATORY_CARE_PROVIDER_SITE_OTHER): Payer: BLUE CROSS/BLUE SHIELD | Admitting: Family Medicine

## 2015-12-26 VITALS — BP 108/72 | HR 68 | Temp 98.4°F | Resp 14 | Ht 70.5 in | Wt 212.0 lb

## 2015-12-26 DIAGNOSIS — Z2821 Immunization not carried out because of patient refusal: Secondary | ICD-10-CM | POA: Diagnosis not present

## 2015-12-26 DIAGNOSIS — J0100 Acute maxillary sinusitis, unspecified: Secondary | ICD-10-CM | POA: Diagnosis not present

## 2015-12-26 DIAGNOSIS — Z1211 Encounter for screening for malignant neoplasm of colon: Secondary | ICD-10-CM | POA: Diagnosis not present

## 2015-12-26 DIAGNOSIS — Z0000 Encounter for general adult medical examination without abnormal findings: Secondary | ICD-10-CM | POA: Diagnosis not present

## 2015-12-26 DIAGNOSIS — R5383 Other fatigue: Secondary | ICD-10-CM

## 2015-12-26 DIAGNOSIS — Z125 Encounter for screening for malignant neoplasm of prostate: Secondary | ICD-10-CM

## 2015-12-26 LAB — POCT URINALYSIS DIPSTICK
Bilirubin, UA: NEGATIVE
Glucose, UA: NEGATIVE
Ketones, UA: NEGATIVE
Leukocytes, UA: NEGATIVE
NITRITE UA: NEGATIVE
PH UA: 5
Protein, UA: NEGATIVE
RBC UA: NEGATIVE
SPEC GRAV UA: 1.02
UROBILINOGEN UA: 0.2

## 2015-12-26 LAB — IFOBT (OCCULT BLOOD): IMMUNOLOGICAL FECAL OCCULT BLOOD TEST: NEGATIVE

## 2015-12-26 MED ORDER — DOXYCYCLINE HYCLATE 100 MG PO TABS: 100.0000 mg | ORAL_TABLET | Freq: Two times a day (BID) | ORAL | 0 refills | Status: DC

## 2015-12-26 NOTE — Progress Notes (Signed)
Patient: Tyler Mcclure, Male    DOB: [birth year], 55 y.o.   MRN: [id number] Visit Date: 12/26/2015  Today's Provider: [name], MD   Chief Complaint  Patient presents with  . Annual Exam   Subjective:  Tyler Mcclure is a 55 y.o. male who presents today for health maintenance and complete physical. He feels fairly well. He reports exercising 5 times a week, he does weights and cardio from 30 minutes to 1 hour.Marland Kitchen He reports he is sleeping well. He has had a cold over the past week facial pain and pain in the upper teeth.  Last  colonoscopy 02/24/12 normal repeat 10 years.  Tdap 11/24/08  Review of Systems  Constitutional: Positive for fatigue.  HENT: Positive for congestion and sinus pressure.   Eyes: Negative.   Respiratory: Negative.   Cardiovascular: Negative.   Gastrointestinal: Negative.   Endocrine: Negative.   Genitourinary: Negative.   Musculoskeletal: Negative.   Skin: Negative.   Allergic/Immunologic: Positive for environmental allergies.  Neurological: Negative.   Hematological: Negative.   Psychiatric/Behavioral: Negative.     Social History   Social History  . Marital status: Married    Spouse name: N/A  . Number of children: N/A  . Years of education: N/A   Occupational History  . Not on file.   Social History Main Topics  . Smoking status: Never Smoker  . Smokeless tobacco: Current User  . Alcohol use 0.0 oz/week     Comment: OCCASIONALLY  . Drug use: No  . Sexual activity: Not on file   Other Topics Concern  . Not on file   Social History Narrative  . No narrative on file    Patient Active Problem List   Diagnosis Date Noted  . Left pulmonary embolus ([place]) 10/30/2015  . Pulmonary embolus ([place]) 05/24/2015  . Arthritis 05/24/2015  . Rupture of tendon of biceps, long head 11/25/2014  . Fecal occult blood test positive 11/25/2014  . Squamous cell carcinoma of skin 11/25/2014    Past Surgical History:  Procedure Laterality Date  .  BICEPS TENDON REPAIR    . RESECTION DISTAL CLAVICAL Left 04/27/2015   Procedure: DISTAL CLAVICLE EXCISION;  Surgeon: [name], MD;  Location: [facility];  Service: Orthopedics;  Laterality: Left;  . SHOULDER ARTHROSCOPY WITH ROTATOR CUFF REPAIR AND SUBACROMIAL DECOMPRESSION Left 04/27/2015   Procedure: LEFT SHOULDER ARTHROSCOPY WITH DEBRIDEMENT, ACROMIOPLASTY, ROTATOR CUFF REPAIR;  Surgeon: [name], MD;  Location: [facility];  Service: Orthopedics;  Laterality: Left;  . SHOULDER SURGERY      His family history includes Breast cancer in his mother; Healthy in his daughter, sister, and son; Hyperlipidemia in his mother; Pancreatic cancer in his father.    Outpatient Encounter Prescriptions as of 12/26/2015  Medication Sig  . aspirin EC 81 MG tablet Take 81 mg by mouth daily.  . cetirizine (ZYRTEC) 10 MG tablet Take 10 mg by mouth daily.  . Cholecalciferol (VITAMIN D) 2000 UNITS CAPS Take 1 capsule by mouth daily.  . hydrocortisone (ANUSOL-HC) 2.5 % rectal cream Place 1 application rectally 2 (two) times daily.  . MULTIPLE VITAMIN PO Take 1 tablet by mouth daily.  . Omega-3 Fatty Acids (FISH OIL BURP-LESS) 1200 MG CAPS Take 2 capsules by mouth daily.  . vitamin E 400 UNIT capsule Take 1 capsule by mouth daily.  . [DISCONTINUED] fluticasone (FLONASE) 50 MCG/ACT nasal spray Place 2 sprays into both nostrils daily.  . [DISCONTINUED] loratadine (CLARITIN) 10  MG tablet Take 1 tablet (10 mg total) by mouth daily.  . [DISCONTINUED] rivaroxaban (XARELTO) 20 MG TABS tablet Take 1 tablet (20 mg total) by mouth daily with supper. (Patient not taking: Reported on 11/30/2015)   No facility-administered encounter medications on file as of 12/26/2015.     Patient Care Team: Jerrol Banana., MD as PCP - General (Family Medicine)     Objective:   Vitals:  Vitals:   12/26/15 0832  BP: 108/72  Pulse: 68  Resp: 14  Temp: 98.4 F (36.9 C)  Weight: 212 lb  (96.2 kg)  Height: 5' 10.5" (1.791 m)    Physical Exam  Constitutional: He is oriented to person, place, and time. He appears well-developed and well-nourished.  HENT:  Head: Normocephalic and atraumatic.  Right Ear: External ear normal.  Left Ear: External ear normal.  Nose: Right sinus exhibits maxillary sinus tenderness. Left sinus exhibits maxillary sinus tenderness.  Mouth/Throat: Oropharynx is clear and moist.  Eyes: Conjunctivae are normal. Pupils are equal, round, and reactive to light.  Neck: Normal range of motion. Neck supple.  Cardiovascular: Normal rate, regular rhythm, normal heart sounds and intact distal pulses.   No murmur heard. Pulmonary/Chest: Effort normal and breath sounds normal. No respiratory distress. He has no wheezes.  Abdominal: Soft. He exhibits no distension. There is no tenderness.  Genitourinary: Rectum normal, prostate normal and penis normal. Rectal exam shows guaiac negative stool. No penile tenderness.  Musculoskeletal: He exhibits no edema or tenderness.  Neurological: He is alert and oriented to person, place, and time. No cranial nerve deficit. Coordination normal.  Skin: Skin is warm and dry. No rash noted. No erythema.  Psychiatric: He has a normal mood and affect. His behavior is normal. Judgment and thought content normal.     Depression Screen PHQ 2/9 Scores 12/26/2015 09/04/2015  PHQ - 2 Score 0 0      Assessment & Plan:   1. Annual physical exam - POCT urinalysis dipstick - CBC w/Diff/Platelet - Comprehensive metabolic panel - Lipid Panel With LDL/HDL Ratio - TSH  2. Colon cancer screening - IFOBT POC (occult bld, rslt in office)  3. Prostate cancer screening - PSA  4. Influenza vaccination declined  5. Acute non-recurrent maxillary sinusitis Treat with Doxy and follow as needed Patient allergic to penicillin. 6. Other fatigue Check labs. - Testosterone  HPI, Exam and A&P transcribed under direction and in the  presence of Miguel Aschoff, MD. I have done the exam and reviewed the chart and it is accurate to the best of my knowledge. Miguel Aschoff M.D. [facility]

## 2015-12-27 LAB — CBC WITH DIFFERENTIAL/PLATELET
BASOS: 1 %
Basophils Absolute: 0 10*3/uL (ref 0.0–0.2)
EOS (ABSOLUTE): 0.1 10*3/uL (ref 0.0–0.4)
Eos: 2 %
HEMOGLOBIN: 16 g/dL (ref 12.6–17.7)
Hematocrit: 47.9 % (ref 37.5–51.0)
IMMATURE GRANS (ABS): 0 10*3/uL (ref 0.0–0.1)
Immature Granulocytes: 0 %
LYMPHS ABS: 1.9 10*3/uL (ref 0.7–3.1)
LYMPHS: 35 %
MCH: 29.7 pg (ref 26.6–33.0)
MCHC: 33.4 g/dL (ref 31.5–35.7)
MCV: 89 fL (ref 79–97)
MONOCYTES: 11 %
Monocytes Absolute: 0.6 10*3/uL (ref 0.1–0.9)
NEUTROS ABS: 2.7 10*3/uL (ref 1.4–7.0)
Neutrophils: 51 %
Platelets: 215 10*3/uL (ref 150–379)
RBC: 5.38 x10E6/uL (ref 4.14–5.80)
RDW: 13.1 % (ref 12.3–15.4)
WBC: 5.3 10*3/uL (ref 3.4–10.8)

## 2015-12-27 LAB — COMPREHENSIVE METABOLIC PANEL
ALBUMIN: 4.7 g/dL (ref 3.5–5.5)
ALK PHOS: 46 IU/L (ref 39–117)
ALT: 45 IU/L — ABNORMAL HIGH (ref 0–44)
AST: 24 IU/L (ref 0–40)
Albumin/Globulin Ratio: 1.8 (ref 1.2–2.2)
BILIRUBIN TOTAL: 0.9 mg/dL (ref 0.0–1.2)
BUN / CREAT RATIO: 15 (ref 9–20)
BUN: 15 mg/dL (ref 6–24)
CHLORIDE: 101 mmol/L (ref 96–106)
CO2: 26 mmol/L (ref 18–29)
Calcium: 9.3 mg/dL (ref 8.7–10.2)
Creatinine, Ser: 1.02 mg/dL (ref 0.76–1.27)
GFR calc non Af Amer: 83 mL/min/{1.73_m2} (ref >59)
GFR, EST AFRICAN AMERICAN: 96 mL/min/{1.73_m2} (ref >59)
GLOBULIN, TOTAL: 2.6 g/dL (ref 1.5–4.5)
Glucose: 102 mg/dL — ABNORMAL HIGH (ref 65–99)
POTASSIUM: 4.9 mmol/L (ref 3.5–5.2)
Sodium: 144 mmol/L (ref 134–144)
TOTAL PROTEIN: 7.3 g/dL (ref 6.0–8.5)

## 2015-12-27 LAB — LIPID PANEL WITH LDL/HDL RATIO
CHOLESTEROL TOTAL: 239 mg/dL — AB (ref 100–199)
HDL: 45 mg/dL (ref >39)
LDL Calculated: 120 mg/dL — ABNORMAL HIGH (ref 0–99)
LDl/HDL Ratio: 2.7 ratio units (ref 0.0–3.6)
Triglycerides: 370 mg/dL — ABNORMAL HIGH (ref 0–149)
VLDL CHOLESTEROL CAL: 74 mg/dL — AB (ref 5–40)

## 2015-12-27 LAB — PSA: PROSTATE SPECIFIC AG, SERUM: 0.5 ng/mL (ref 0.0–4.0)

## 2015-12-27 LAB — TSH: TSH: 3.21 u[IU]/mL (ref 0.450–4.500)

## 2015-12-27 LAB — TESTOSTERONE: TESTOSTERONE: 436 ng/dL (ref 264–916)

## 2016-02-12 DIAGNOSIS — Z01419 Encounter for gynecological examination (general) (routine) without abnormal findings: Secondary | ICD-10-CM | POA: Diagnosis not present

## 2016-02-16 ENCOUNTER — Emergency Department
Admission: EM | Admit: 2016-02-16 | Discharge: 2016-02-16 | Disposition: A | Discharge status: Home / Self Care | Payer: BLUE CROSS/BLUE SHIELD | Attending: Emergency Medicine | Admitting: Emergency Medicine

## 2016-02-16 ENCOUNTER — Emergency Department: Payer: BLUE CROSS/BLUE SHIELD

## 2016-02-16 ENCOUNTER — Encounter: Payer: Self-pay | Admitting: Emergency Medicine

## 2016-02-16 DIAGNOSIS — Z7982 Long term (current) use of aspirin: Secondary | ICD-10-CM | POA: Insufficient documentation

## 2016-02-16 DIAGNOSIS — F17200 Nicotine dependence, unspecified, uncomplicated: Secondary | ICD-10-CM | POA: Diagnosis not present

## 2016-02-16 DIAGNOSIS — M79662 Pain in left lower leg: Secondary | ICD-10-CM | POA: Diagnosis not present

## 2016-02-16 DIAGNOSIS — Z79899 Other long term (current) drug therapy: Secondary | ICD-10-CM | POA: Insufficient documentation

## 2016-02-16 DIAGNOSIS — M79605 Pain in left leg: Secondary | ICD-10-CM | POA: Diagnosis present

## 2016-02-16 DIAGNOSIS — M5432 Sciatica, left side: Secondary | ICD-10-CM | POA: Diagnosis not present

## 2016-02-16 HISTORY — DX: Other pulmonary embolism without acute cor pulmonale: I26.99

## 2016-02-16 MED ORDER — OXYCODONE-ACETAMINOPHEN 5-325 MG PO TABS: 2.0000 | ORAL_TABLET | Freq: Four times a day (QID) | ORAL | 0 refills | Status: DC | PRN

## 2016-02-16 MED ORDER — KETOROLAC TROMETHAMINE 60 MG/2ML IM SOLN
INTRAMUSCULAR | Status: AC
  Administered 2016-02-16: 60 mg via INTRAMUSCULAR
  Filled 2016-02-16: qty 2

## 2016-02-16 MED ORDER — PREDNISONE 10 MG (21) PO TBPK: ORAL_TABLET | ORAL | 0 refills | Status: DC

## 2016-02-16 MED ORDER — KETOROLAC TROMETHAMINE 60 MG/2ML IM SOLN
60.0000 mg | Freq: Once | INTRAMUSCULAR | Status: AC
  Administered 2016-02-16: 60 mg via INTRAMUSCULAR

## 2016-02-16 NOTE — ED Notes (Signed)
Patient transported to Ultrasound 

## 2016-02-16 NOTE — ED Provider Notes (Signed)
[facility] Emergency Department Provider Note        Time seen: ----------------------------------------- 7:28 PM on 02/16/2016 -----------------------------------------    I have reviewed the triage vital signs and the nursing notes.   HISTORY  Chief Complaint Leg Pain    HPI Tyler Mcclure is a 55 y.o. male who presents to ER with left leg pain. Patient states it hurts so much she can't stand on it.Patient states he was riding a riding lawnmower today when the symptoms started. He does report history of PE and was told he likely had a DVT in the past although was never discovered. He denies recent fevers, chills or other complaints. He describes the pain as burning.   Past Medical History:  Diagnosis Date  . Arthritis   . Pulmonary embolus [facility])     Patient Active Problem List   Diagnosis Date Noted  . Left pulmonary embolus ([place]) 10/30/2015  . Pulmonary embolus ([place]) 05/24/2015  . Arthritis 05/24/2015  . Rupture of tendon of biceps, long head 11/25/2014  . Fecal occult blood test positive 11/25/2014  . Squamous cell carcinoma of skin 11/25/2014    Past Surgical History:  Procedure Laterality Date  . BICEPS TENDON REPAIR    . RESECTION DISTAL CLAVICAL Left 04/27/2015   Procedure: DISTAL CLAVICLE EXCISION;  Surgeon: [name], MD;  Location: [place];  Service: Orthopedics;  Laterality: Left;  . SHOULDER ARTHROSCOPY WITH ROTATOR CUFF REPAIR AND SUBACROMIAL DECOMPRESSION Left 04/27/2015   Procedure: LEFT SHOULDER ARTHROSCOPY WITH DEBRIDEMENT, ACROMIOPLASTY, ROTATOR CUFF REPAIR;  Surgeon: [name], MD;  Location: [place];  Service: Orthopedics;  Laterality: Left;  . SHOULDER SURGERY      Allergies Mercury and Penicillins  Social History Social History  Substance Use Topics  . Smoking status: Never Smoker  . Smokeless tobacco: Current User  . Alcohol use 0.0 oz/week     Comment: OCCASIONALLY     Review of Systems Constitutional: Negative for fever. Cardiovascular: Negative for chest pain. Respiratory: Negative for shortness of breath. Gastrointestinal: Negative for abdominal pain, vomiting and diarrhea. Genitourinary: Negative for dysuria. Musculoskeletal: Positive for burning anterior left leg pain Skin: Negative for rash. Neurological: Negative for headaches, focal weakness or numbness.  10-point ROS otherwise negative.  ____________________________________________   PHYSICAL EXAM:  VITAL SIGNS: ED Triage Vitals  Enc Vitals Group     BP 02/16/16 1922 (!) 140/94     Pulse Rate 02/16/16 1922 92     Resp 02/16/16 1922 16     Temp 02/16/16 1922 98.1 F (36.7 C)     Temp Source 02/16/16 1922 Oral     SpO2 02/16/16 1922 99 %     Weight 02/16/16 1923 211 lb (95.7 kg)     Height 02/16/16 1923 6' (1.829 m)     Head Circumference --      Peak Flow --      Pain Score 02/16/16 1923 7     Pain Loc --      Pain Edu? --      Excl. in [place]? --     Constitutional: Alert and oriented. Well appearing and in no distress. Eyes: Conjunctivae are normal. PERRL. Normal extraocular movements. ENT   Head: Normocephalic and atraumatic.   Nose: No congestion/rhinnorhea.   Mouth/Throat: Mucous membranes are moist.   Neck: No stridor. Cardiovascular: Normal rate, regular rhythm. No murmurs, rubs, or gallops. Respiratory: Normal respiratory effort without tachypnea nor retractions. Breath sounds are  clear and equal bilaterally. No wheezes/rales/rhonchi. Gastrointestinal: Soft and nontender. Normal bowel sounds Musculoskeletal: Nontender with normal range of motion in all extremities. No lower extremity tenderness nor edema. Neurologic:  Normal speech and language. No gross focal neurologic deficits are appreciated.  Skin:  Skin is warm, dry and intact. No rash noted. Psychiatric: Mood and affect are normal. Speech and behavior are normal.   ____________________________________________  ED COURSE:  Pertinent labs & imaging results that were available during my care of the patient were reviewed by me and considered in my medical decision making (see chart for details). Clinical Course   Patient is in no distress, we will assess with ultrasound and reevaluate  Procedures RADIOLOGY  Left lower extremity ultrasound Is unremarkable ____________________________________________  FINAL ASSESSMENT AND PLAN  Left leg pain, sciatica  Plan: Patient with imaging as dictated above. Patient's in no distress, ultrasound is negative. This is likely sciatica producing pain to the left quadriceps region. Patient will be discharged with pain medicine and a steroid taper. He is stable for outpatient follow-up.   Earleen Newport, MD   Note: This dictation was prepared with Dragon dictation. Any transcriptional errors that result from this process are unintentional    Earleen Newport, MD 02/16/16 2048

## 2016-02-16 NOTE — ED Triage Notes (Signed)
Pt states has left anterior upper leg and thigh pain. Pt states "it hurts so much I can't even stand on it." pt states has a history of pe and finished xarelto in august. Pt denies shob, chest pain.

## 2016-02-20 ENCOUNTER — Other Ambulatory Visit: Payer: Self-pay | Admitting: Sports Medicine

## 2016-02-20 DIAGNOSIS — M545 Low back pain: Principal | ICD-10-CM

## 2016-02-20 DIAGNOSIS — G8929 Other chronic pain: Secondary | ICD-10-CM

## 2016-02-29 ENCOUNTER — Ambulatory Visit
Admission: RE | Admit: 2016-02-29 | Discharge: 2016-02-29 | Disposition: A | Discharge status: Home / Self Care | Payer: BLUE CROSS/BLUE SHIELD | Source: Ambulatory Visit | Attending: Sports Medicine | Admitting: Sports Medicine

## 2016-02-29 DIAGNOSIS — M5126 Other intervertebral disc displacement, lumbar region: Secondary | ICD-10-CM | POA: Diagnosis not present

## 2016-02-29 DIAGNOSIS — M545 Low back pain: Principal | ICD-10-CM

## 2016-02-29 DIAGNOSIS — G8929 Other chronic pain: Secondary | ICD-10-CM

## 2016-03-04 DIAGNOSIS — M5106 Intervertebral disc disorders with myelopathy, lumbar region: Secondary | ICD-10-CM | POA: Diagnosis not present

## 2016-05-16 DIAGNOSIS — M5127 Other intervertebral disc displacement, lumbosacral region: Secondary | ICD-10-CM | POA: Diagnosis not present

## 2016-06-05 DIAGNOSIS — L820 Inflamed seborrheic keratosis: Secondary | ICD-10-CM | POA: Diagnosis not present

## 2016-06-05 DIAGNOSIS — Z85828 Personal history of other malignant neoplasm of skin: Secondary | ICD-10-CM | POA: Diagnosis not present

## 2016-07-18 DIAGNOSIS — L570 Actinic keratosis: Secondary | ICD-10-CM | POA: Diagnosis not present

## 2016-07-18 DIAGNOSIS — L408 Other psoriasis: Secondary | ICD-10-CM | POA: Diagnosis not present

## 2016-12-26 ENCOUNTER — Ambulatory Visit (INDEPENDENT_AMBULATORY_CARE_PROVIDER_SITE_OTHER): Payer: BLUE CROSS/BLUE SHIELD | Admitting: Family Medicine

## 2016-12-26 ENCOUNTER — Encounter: Payer: Self-pay | Admitting: Family Medicine

## 2016-12-26 VITALS — BP 126/86 | HR 82 | Temp 98.5°F | Resp 16 | Ht 69.0 in | Wt 235.0 lb

## 2016-12-26 DIAGNOSIS — G4709 Other insomnia: Secondary | ICD-10-CM | POA: Diagnosis not present

## 2016-12-26 DIAGNOSIS — R739 Hyperglycemia, unspecified: Secondary | ICD-10-CM

## 2016-12-26 DIAGNOSIS — M722 Plantar fascial fibromatosis: Secondary | ICD-10-CM | POA: Insufficient documentation

## 2016-12-26 DIAGNOSIS — Z0000 Encounter for general adult medical examination without abnormal findings: Secondary | ICD-10-CM | POA: Diagnosis not present

## 2016-12-26 DIAGNOSIS — Z1211 Encounter for screening for malignant neoplasm of colon: Secondary | ICD-10-CM | POA: Diagnosis not present

## 2016-12-26 DIAGNOSIS — M5126 Other intervertebral disc displacement, lumbar region: Secondary | ICD-10-CM | POA: Diagnosis not present

## 2016-12-26 DIAGNOSIS — K644 Residual hemorrhoidal skin tags: Secondary | ICD-10-CM

## 2016-12-26 DIAGNOSIS — Z2821 Immunization not carried out because of patient refusal: Secondary | ICD-10-CM

## 2016-12-26 DIAGNOSIS — Z125 Encounter for screening for malignant neoplasm of prostate: Secondary | ICD-10-CM | POA: Diagnosis not present

## 2016-12-26 LAB — POCT URINALYSIS DIPSTICK
BILIRUBIN UA: NEGATIVE
Glucose, UA: NEGATIVE
Ketones, UA: NEGATIVE
Leukocytes, UA: NEGATIVE
NITRITE UA: NEGATIVE
RBC UA: NEGATIVE
Spec Grav, UA: 1.025 (ref 1.010–1.025)
Urobilinogen, UA: 0.2 E.U./dL
pH, UA: 5 (ref 5.0–8.0)

## 2016-12-26 LAB — IFOBT (OCCULT BLOOD): IFOBT: NEGATIVE

## 2016-12-26 MED ORDER — HYDROCORTISONE 2.5 % RE CREA: 1.0000 "application " | TOPICAL_CREAM | Freq: Two times a day (BID) | RECTAL | 12 refills | Status: DC

## 2016-12-26 NOTE — Progress Notes (Signed)
Patient: Tyler Mcclure, Male    DOB: [birth year], 56 y.o.   MRN: [id number] Visit Date: 12/26/2016  Today's Provider: [name], MD   Chief Complaint  Patient presents with  . Annual Exam   Subjective:  Tyler Mcclure is a 56 y.o. male who presents today for health maintenance and complete physical. He feels fairly well. He reports exercising 5 days a week, 45 minutes to an hour at a time-weight lifting and some aerobics exercises.,. He reports he is sleeping not well, he has not issue falling asleep but wakes up during the night several times.  Last colonoscopy was 02/24/12 by Dr Candace Cruise, it was normal, repeat in 2023.  Review of Systems  Constitutional: Positive for fatigue.  HENT: Negative.   Eyes: Negative.   Respiratory: Negative.   Cardiovascular: Negative.   Gastrointestinal: Negative.   Endocrine: Negative.   Genitourinary: Negative.   Musculoskeletal: Positive for back pain.  Skin: Positive for rash.  Allergic/Immunologic: Negative.   Neurological: Positive for weakness (left leg) and numbness (lower left leg).  Hematological: Negative.   Psychiatric/Behavioral: Positive for sleep disturbance.    Social History   Social History  . Marital status: Married    Spouse name: N/A  . Number of children: N/A  . Years of education: N/A   Occupational History  . Not on file.   Social History Main Topics  . Smoking status: Never Smoker  . Smokeless tobacco: Former Systems developer    Quit date: 04/26/2014  . Alcohol use 0.0 oz/week     Comment: OCCASIONALLY  . Drug use: No  . Sexual activity: Not on file   Other Topics Concern  . Not on file   Social History Narrative  . No narrative on file    Patient Active Problem List   Diagnosis Date Noted  . Herniated lumbar intervertebral disc 12/26/2016  . Plantar fasciitis 12/26/2016  . Left pulmonary embolus ([place]) 10/30/2015  . Pulmonary embolus ([place]) 05/24/2015  . Arthritis 05/24/2015  . Rupture of tendon of biceps, long  head 11/25/2014  . Fecal occult blood test positive 11/25/2014  . Squamous cell carcinoma of skin 11/25/2014    Past Surgical History:  Procedure Laterality Date  . BICEPS TENDON REPAIR    . RESECTION DISTAL CLAVICAL Left 04/27/2015   Procedure: DISTAL CLAVICLE EXCISION;  Surgeon: [name], MD;  Location: [facility];  Service: Orthopedics;  Laterality: Left;  . SHOULDER ARTHROSCOPY WITH ROTATOR CUFF REPAIR AND SUBACROMIAL DECOMPRESSION Left 04/27/2015   Procedure: LEFT SHOULDER ARTHROSCOPY WITH DEBRIDEMENT, ACROMIOPLASTY, ROTATOR CUFF REPAIR;  Surgeon: [name], MD;  Location: [facility];  Service: Orthopedics;  Laterality: Left;  . SHOULDER SURGERY      His family history includes Breast cancer in his mother; Healthy in his daughter, sister, and son; Hyperlipidemia in his mother; Pancreatic cancer in his father.     Outpatient Encounter Prescriptions as of 12/26/2016  Medication Sig  . aspirin EC 81 MG tablet Take 81 mg by mouth daily.  . Cholecalciferol (VITAMIN D) 2000 UNITS CAPS Take 1 capsule by mouth daily.  Marland Kitchen desonide (DESOWEN) 0.05 % cream apply to affected area twice a day if needed  . MULTIPLE VITAMIN PO Take 1 tablet by mouth daily.  . Omega-3 Fatty Acids (FISH OIL BURP-LESS) 1200 MG CAPS Take 2 capsules by mouth daily.  . vitamin E 400 UNIT capsule Take 1 capsule by mouth daily.  . hydrocortisone (ANUSOL-HC) 2.5 % rectal cream  Place 1 application rectally 2 (two) times daily. (Patient not taking: Reported on 12/26/2016)  . [DISCONTINUED] cetirizine (ZYRTEC) 10 MG tablet Take 10 mg by mouth daily.  . [DISCONTINUED] doxycycline (VIBRA-TABS) 100 MG tablet Take 1 tablet (100 mg total) by mouth 2 (two) times daily.  . [DISCONTINUED] oxyCODONE-acetaminophen (PERCOCET) 5-325 MG tablet Take 2 tablets by mouth every 6 (six) hours as needed for moderate pain or severe pain.  . [DISCONTINUED] predniSONE (STERAPRED UNI-PAK 21 TAB) 10 MG (21) TBPK  tablet Dispense steroid taper pack as directed   No facility-administered encounter medications on file as of 12/26/2016.     Patient Care Team: Jerrol Banana., MD as PCP - General (Family Medicine)      Objective:   Vitals:  Vitals:   12/26/16 0839  BP: 126/86  Pulse: 82  Resp: 16  Temp: 98.5 F (36.9 C)  Weight: 235 lb (106.6 kg)  Height: 5' 9"  (1.753 m)    Physical Exam  Constitutional: He is oriented to person, place, and time. He appears well-developed and well-nourished.  HENT:  Head: Normocephalic and atraumatic.  Right Ear: External ear normal.  Left Ear: External ear normal.  Mouth/Throat: Oropharynx is clear and moist.  Eyes: Pupils are equal, round, and reactive to light. Conjunctivae are normal.  Neck: Normal range of motion. Neck supple.  Cardiovascular: Normal rate, regular rhythm, normal heart sounds and intact distal pulses.  Exam reveals no gallop.   No murmur heard. Pulmonary/Chest: Effort normal and breath sounds normal. No respiratory distress. He has no wheezes.  Abdominal: Soft. Bowel sounds are normal. He exhibits no distension. There is no tenderness.  Genitourinary: Rectum normal, prostate normal and penis normal. Rectal exam shows guaiac negative stool. No penile tenderness.  Musculoskeletal: Normal range of motion. He exhibits no edema or tenderness.  Neurological: He is alert and oriented to person, place, and time. No cranial nerve deficit. Coordination normal.  Skin: Skin is warm and dry. No rash noted. No erythema.  Psychiatric: He has a normal mood and affect. His behavior is normal. Judgment and thought content normal.   Waist size is 38 inches.  Depression Screen PHQ 2/9 Scores 12/26/2016 12/26/2015 09/04/2015  PHQ - 2 Score 0 0 0  PHQ- 9 Score 3 - -   Assessment & Plan:  1. Annual physical exam - CBC with Differential/Platelet - Comprehensive metabolic panel - TSH - Lipid Profile - POCT Urinalysis Dipstick  2. Colon  cancer screening - IFOBT POC (occult bld, rslt in office); Future - IFOBT POC (occult bld, rslt in office)  3. Herniated lumbar intervertebral disc 4. Plantar fasciitis 5. Prostate cancer screening - PSA  6. Influenza vaccination declined 7. Hyperglycemia - HgB A1c  8. Other insomnia Follow for now. Epworth score today was 6.  9. External hemorrhoid Refill given. Exam is normal today. - hydrocortisone (ANUSOL-HC) 2.5 % rectal cream; Place 1 application rectally 2 (two) times daily.  Dispense: 30 g; Refill: 12  HPI, Exam and A&P transcribed by Tiffany Kocher, RMA under direction and in the presence of Miguel Aschoff, MD. I have done the exam and reviewed the chart and it is accurate to the best of my knowledge. Development worker, community has been used and  any errors in dictation or transcription are unintentional. Miguel Aschoff M.D. [facility]

## 2016-12-27 LAB — COMPREHENSIVE METABOLIC PANEL
AG Ratio: 1.6 (calc) (ref 1.0–2.5)
ALT: 122 U/L — AB (ref 9–46)
AST: 56 U/L — AB (ref 10–35)
Albumin: 4.7 g/dL (ref 3.6–5.1)
Alkaline phosphatase (APISO): 44 U/L (ref 40–115)
BUN: 16 mg/dL (ref 7–25)
CO2: 25 mmol/L (ref 20–32)
CREATININE: 0.79 mg/dL (ref 0.70–1.33)
Calcium: 9.5 mg/dL (ref 8.6–10.3)
Chloride: 106 mmol/L (ref 98–110)
GLUCOSE: 111 mg/dL — AB (ref 65–99)
Globulin: 2.9 g/dL (calc) (ref 1.9–3.7)
Potassium: 4.3 mmol/L (ref 3.5–5.3)
Sodium: 140 mmol/L (ref 135–146)
Total Bilirubin: 1.1 mg/dL (ref 0.2–1.2)
Total Protein: 7.6 g/dL (ref 6.1–8.1)

## 2016-12-27 LAB — CBC WITH DIFFERENTIAL/PLATELET
BASOS ABS: 71 {cells}/uL (ref 0–200)
BASOS PCT: 1.4 %
EOS ABS: 92 {cells}/uL (ref 15–500)
EOS PCT: 1.8 %
HEMATOCRIT: 51.7 % — AB (ref 38.5–50.0)
HEMOGLOBIN: 17.7 g/dL — AB (ref 13.2–17.1)
LYMPHS ABS: 1673 {cells}/uL (ref 850–3900)
MCH: 30.5 pg (ref 27.0–33.0)
MCHC: 34.2 g/dL (ref 32.0–36.0)
MCV: 89.1 fL (ref 80.0–100.0)
MPV: 11.3 fL (ref 7.5–12.5)
Monocytes Relative: 11.1 %
NEUTROS ABS: 2698 {cells}/uL (ref 1500–7800)
Neutrophils Relative %: 52.9 %
PLATELETS: 201 10*3/uL (ref 140–400)
RBC: 5.8 10*6/uL (ref 4.20–5.80)
RDW: 12.2 % (ref 11.0–15.0)
Total Lymphocyte: 32.8 %
WBC mixed population: 566 cells/uL (ref 200–950)
WBC: 5.1 10*3/uL (ref 3.8–10.8)

## 2016-12-27 LAB — HEMOGLOBIN A1C
EAG (MMOL/L): 5.8 (calc)
Hgb A1c MFr Bld: 5.3 % of total Hgb (ref <5.7)
Mean Plasma Glucose: 105 (calc)

## 2016-12-27 LAB — LIPID PANEL
CHOL/HDL RATIO: 3.8 (calc) (ref <5.0)
CHOLESTEROL: 211 mg/dL — AB (ref <200)
HDL: 56 mg/dL (ref >40)
LDL CHOLESTEROL (CALC): 126 mg/dL — AB
Non-HDL Cholesterol (Calc): 155 mg/dL (calc) — ABNORMAL HIGH (ref <130)
Triglycerides: 174 mg/dL — ABNORMAL HIGH (ref <150)

## 2016-12-27 LAB — PSA: PSA: 0.3 ng/mL (ref <=4.0)

## 2016-12-27 LAB — TSH: TSH: 3.01 m[IU]/L (ref 0.40–4.50)

## 2017-01-01 ENCOUNTER — Telehealth: Payer: Self-pay | Admitting: Family Medicine

## 2017-01-01 ENCOUNTER — Encounter: Payer: Self-pay | Admitting: Family Medicine

## 2017-01-01 NOTE — Telephone Encounter (Signed)
-----   Message from Jerrol Banana., MD sent at 01/01/2017  8:48 AM EDT ----- Prediabetic--likely fatty liver. Limit alcohol and tylenol. RTC 2-3 months for recheck.

## 2017-01-01 NOTE — Telephone Encounter (Signed)
Pt returned Ana's call about lab results that were done on 12/26/16. Please advise. Thanks TNP

## 2017-01-01 NOTE — Telephone Encounter (Signed)
Patient advised. Follow up appointment scheduled. Patient prefers to have lab results to discuss at FU appt. He will call back the week before appointment to request lab slip.

## 2017-01-02 ENCOUNTER — Telehealth: Payer: Self-pay

## 2017-01-02 DIAGNOSIS — R140 Abdominal distension (gaseous): Secondary | ICD-10-CM

## 2017-01-02 DIAGNOSIS — R748 Abnormal levels of other serum enzymes: Secondary | ICD-10-CM

## 2017-01-02 NOTE — Telephone Encounter (Signed)
Patient called back today after looking at his results on mychart. He is worried about his lever enzymes been so much more elevated. He goggled what all this could mean and saw some symptoms said been bloated, dark urine and he has that. He states he weighs more now then ever before and it is all in his stomach. He does not use Tylenol products and alcohol consumption is occasional. I advised patient I will let you know and see if it is ok to go ahead refer patient to GI for further work up, patient states if you needed to see him first and talk about this he was fine with that also. Please review-Anastasiya V Hopkins, RMA

## 2017-01-02 NOTE — Telephone Encounter (Signed)
[place] for GI referral for probable fatty liver.

## 2017-01-02 NOTE — Telephone Encounter (Signed)
Patient aware from earlier phone call that if GI referral is approved he will get a call once referral is set up. -Kris Mouton, RMA

## 2017-01-21 ENCOUNTER — Other Ambulatory Visit: Payer: Self-pay

## 2017-01-21 ENCOUNTER — Other Ambulatory Visit: Payer: Self-pay | Admitting: Gastroenterology

## 2017-01-21 ENCOUNTER — Other Ambulatory Visit
Admission: RE | Admit: 2017-01-21 | Discharge: 2017-01-21 | Disposition: A | Discharge status: Home / Self Care | Payer: BLUE CROSS/BLUE SHIELD | Source: Ambulatory Visit | Attending: Gastroenterology | Admitting: Gastroenterology

## 2017-01-21 ENCOUNTER — Ambulatory Visit (INDEPENDENT_AMBULATORY_CARE_PROVIDER_SITE_OTHER): Payer: BLUE CROSS/BLUE SHIELD | Admitting: Gastroenterology

## 2017-01-21 ENCOUNTER — Encounter: Payer: Self-pay | Admitting: Gastroenterology

## 2017-01-21 VITALS — BP 130/89 | HR 106 | Temp 98.2°F | Ht 70.0 in | Wt 226.0 lb

## 2017-01-21 DIAGNOSIS — R748 Abnormal levels of other serum enzymes: Secondary | ICD-10-CM

## 2017-01-21 DIAGNOSIS — R7989 Other specified abnormal findings of blood chemistry: Secondary | ICD-10-CM

## 2017-01-21 DIAGNOSIS — R945 Abnormal results of liver function studies: Secondary | ICD-10-CM | POA: Insufficient documentation

## 2017-01-21 DIAGNOSIS — Z800 Family history of malignant neoplasm of digestive organs: Secondary | ICD-10-CM | POA: Diagnosis not present

## 2017-01-21 LAB — HEPATIC FUNCTION PANEL
ALBUMIN: 4.8 g/dL (ref 3.5–5.0)
ALT: 84 U/L — AB (ref 17–63)
AST: 48 U/L — AB (ref 15–41)
Alkaline Phosphatase: 44 U/L (ref 38–126)
BILIRUBIN DIRECT: 0.2 mg/dL (ref 0.1–0.5)
BILIRUBIN TOTAL: 1.3 mg/dL — AB (ref 0.3–1.2)
Indirect Bilirubin: 1.1 mg/dL — ABNORMAL HIGH (ref 0.3–0.9)
Total Protein: 8 g/dL (ref 6.5–8.1)

## 2017-01-21 LAB — FERRITIN: Ferritin: 870 ng/mL — ABNORMAL HIGH (ref 24–336)

## 2017-01-21 NOTE — Progress Notes (Signed)
[name], MD [address]  [place]  [place], [place] [address]  Main: [phone]  Fax: [phone]    Gastroenterology Consultation  Referring Provider:     Jerrol Banana.,* Primary Care Physician:  Jerrol Banana., MD Primary Gastroenterologist:  Dr. [name] Reason for Consultation:     Elevated transaminases        HPI:   Tyler Mcclure is a 56 y.o. y/o male referred by Dr. [name], [name]., MD  for consultation & management of Chronically elevated LFTs. Initial rise in LFTs was first noticed in 11/2014, Isolated mildly elevated ALT until 01/12/2016, followed by rise in both AST and AST in 12/2016 at 56/122. Rest of the LFTs were normal. He underwent rotator cuff surgery in 04/2015, followed by developed chest pain, and diagnosed with PE in 05/2015 for which she was on anticoagulation for 6 months. He was seen by hematology for workup of PE and it was negative, thought to be provoked. During this time he wasn't exercising much and was not eating healthy, gained weight and his weight increased from 210 pounds to 230 pounds as of 12/26/2016. He also reports 3-4 months of bloating and vague, lower abdominal discomfort. He had intervertebral disc prolapse and surgery of his lower back and has been having low back pain. Denies any other GI symptoms. He denies IV drug abuse, blood transfusions in the past, active [employer], family history of the disease or liver cancer, heavy NSAID use or excess Tylenol use or any other herbal supplements.  His weight down to 226lbs today following strict healthy diet and regular exercise, 235lbs 1 month ago, was 210lbs in 12/2015  NSAIDs: ASA 81, stopped 2 weeks ago, occassional ibuprofen  Antiplts/Anticoagulants/Anti thrombotics: xarelto until 10/2015 for 6 months for PE  Father deceased at age 92 from pancreatic cancer Mother from ruptured colon cancer at age 8  ETOH stopped 1 month ago, used to drink 3-4 beers  during weekends GI Procedures: Colonoscopy at age 57  Past Medical History:  Diagnosis Date  . Arthritis   . Pulmonary embolus [facility])     Past Surgical History:  Procedure Laterality Date  . BICEPS TENDON REPAIR    . RESECTION DISTAL CLAVICAL Left 04/27/2015   Procedure: DISTAL CLAVICLE EXCISION;  Surgeon: [name], MD;  Location: [facility];  Service: Orthopedics;  Laterality: Left;  . SHOULDER ARTHROSCOPY WITH ROTATOR CUFF REPAIR AND SUBACROMIAL DECOMPRESSION Left 04/27/2015   Procedure: LEFT SHOULDER ARTHROSCOPY WITH DEBRIDEMENT, ACROMIOPLASTY, ROTATOR CUFF REPAIR;  Surgeon: [name], MD;  Location: [facility];  Service: Orthopedics;  Laterality: Left;  . SHOULDER SURGERY      Prior to Admission medications   Medication Sig Start Date End Date Taking? Authorizing Provider  aspirin EC 81 MG tablet Take 81 mg by mouth daily.    [provider]  Cholecalciferol (VITAMIN D) 2000 UNITS CAPS Take 1 capsule by mouth daily.    [provider]  desonide (DESOWEN) 0.05 % cream apply to affected area twice a day if needed 11/22/16   [provider]  hydrocortisone (ANUSOL-HC) 2.5 % rectal cream Place 1 application rectally 2 (two) times daily. 12/26/16   Jerrol Banana., MD  MULTIPLE VITAMIN PO Take 1 tablet by mouth daily.    [provider]  Omega-3 Fatty Acids (FISH OIL BURP-LESS) 1200 MG CAPS Take 2 capsules by mouth daily.    [provider]  vitamin E 400 UNIT capsule Take 1 capsule by mouth daily.    [provider]    Family History  Problem Relation Age of Onset  . Breast cancer Mother   . Hyperlipidemia Mother   . Pancreatic cancer Father   . Healthy Sister   . Healthy Daughter   . Healthy Son      Social History  Substance Use Topics  . Smoking status: Never Smoker  . Smokeless tobacco: Former Systems developer    Quit date: 04/26/2014  . Alcohol use No     Comment: OCCASIONALLY     Allergies as of 01/21/2017 - Review Complete 01/21/2017  Allergen Reaction Noted  . Mercury  11/25/2014  . Penicillins  11/25/2014    Review of Systems:    All systems reviewed and negative except where noted in HPI.   Physical Exam:  BP 130/89   Pulse (!) 106   Temp 98.2 F (36.8 C) (Oral)   Ht 5' 10"  (1.778 m)   Wt 226 lb (102.5 kg)   BMI 32.43 kg/m  No LMP for male patient.  General:   Alert,  Well-developed, well-nourished, pleasant and cooperative in NAD Head:  Normocephalic and atraumatic. Eyes:  Sclera clear, no icterus.   Conjunctiva pink. Ears:  Normal auditory acuity. Nose:  No deformity, discharge, or lesions. Mouth:  No deformity or lesions,oropharynx pink & moist. Neck:  Supple; no masses or thyromegaly. Lungs:  Respirations even and unlabored.  Clear throughout to auscultation.   No wheezes, crackles, or rhonchi. No acute distress. Heart:  Regular rate and rhythm; no murmurs, clicks, rubs, or gallops. Abdomen:  Normal bowel sounds. Obese, soft, non-tender and non-distended without masses, hepatosplenomegaly or hernias noted.  No guarding or rebound tenderness.   Rectal: Nor performed Msk:  Symmetrical without gross deformities. Good, equal movement & strength bilaterally. Pulses:  Normal pulses noted. Extremities:  No clubbing or edema.  No cyanosis. Neurologic:  Alert and oriented x3;  grossly normal neurologically. Skin:  Intact without significant lesions or rashes. No jaundice. Lymph Nodes:  No significant cervical adenopathy. Psych:  Alert and cooperative. Normal mood and affect.  Imaging Studies: None  Assessment and Plan:   HELEN CUFF is a 56 y.o. white male with history of PE probably provoked was on 6 months of anticoagulation, he is here for further evaluation of chronically elevated LFTs. Also, he has family history of pancreatic cancer.  Abnormal LFTs: Probably secondary to nonalcoholic fatty liver disease - Recheck LFTs given his  weight loss from recent lifestyle changes, expect the liver enzymes to improve - Check ferritin, acute hepatitis panel, AMA, ASMA, AMA, TTG, NASH fibrosure - CT A/P as below  Family history of pancreatic cancer in first-degree relative. Patient is stressed out about his father being diagnosed with pancreatic cancer at age 39 - He has vague upper GI symptoms - Recommend CT pancreas protocol   Follow up in 4 weeks   [name], MD

## 2017-01-22 ENCOUNTER — Other Ambulatory Visit: Payer: Self-pay | Admitting: Gastroenterology

## 2017-01-22 ENCOUNTER — Other Ambulatory Visit: Payer: Self-pay

## 2017-01-22 ENCOUNTER — Telehealth: Payer: Self-pay

## 2017-01-22 DIAGNOSIS — R748 Abnormal levels of other serum enzymes: Secondary | ICD-10-CM

## 2017-01-22 LAB — HEPATITIS PANEL, ACUTE
HCV Ab: <0.1 s/co ratio (ref 0.0–0.9)
HEP A IGM: NEGATIVE
Hep B C IgM: NEGATIVE
Hepatitis B Surface Ag: NEGATIVE

## 2017-01-22 LAB — TISSUE TRANSGLUTAMINASE, IGA

## 2017-01-22 LAB — MITOCHONDRIAL ANTIBODIES: MITOCHONDRIAL M2 AB, IGG: 6 U (ref 0.0–20.0)

## 2017-01-22 LAB — ANTINUCLEAR ANTIBODIES, IFA: ANA Ab, IFA: NEGATIVE

## 2017-01-22 LAB — ANTI-SMOOTH MUSCLE ANTIBODY, IGG: F-ACTIN AB IGG: 11 U (ref 0–19)

## 2017-01-22 NOTE — Telephone Encounter (Signed)
Pt has been informed of CT of Pancreas scheduled for 01/28/17 arrival time 10:45 am location [place].  Instructed pt not to eat anything after 7am.  Thanks Sharyn Lull

## 2017-01-23 ENCOUNTER — Other Ambulatory Visit
Admission: RE | Admit: 2017-01-23 | Discharge: 2017-01-23 | Disposition: A | Discharge status: Home / Self Care | Payer: BLUE CROSS/BLUE SHIELD | Source: Ambulatory Visit | Attending: Gastroenterology | Admitting: Gastroenterology

## 2017-01-23 ENCOUNTER — Telehealth: Payer: Self-pay

## 2017-01-23 ENCOUNTER — Encounter: Payer: Self-pay | Admitting: Gastroenterology

## 2017-01-23 DIAGNOSIS — R748 Abnormal levels of other serum enzymes: Secondary | ICD-10-CM | POA: Insufficient documentation

## 2017-01-23 NOTE — Telephone Encounter (Signed)
Pt has been asked to go to hospital for hemochromatosis lab.  He said he saw his results in [place] and plans to go today.  Thanks [employer]

## 2017-01-27 ENCOUNTER — Other Ambulatory Visit: Payer: Self-pay

## 2017-01-28 ENCOUNTER — Ambulatory Visit: Payer: BLUE CROSS/BLUE SHIELD

## 2017-01-28 LAB — HEMOCHROMATOSIS DNA-PCR(C282Y,H63D)

## 2017-01-29 ENCOUNTER — Ambulatory Visit
Admission: RE | Admit: 2017-01-29 | Discharge: 2017-01-29 | Disposition: A | Discharge status: Home / Self Care | Payer: BLUE CROSS/BLUE SHIELD | Source: Ambulatory Visit | Attending: Gastroenterology | Admitting: Gastroenterology

## 2017-01-30 ENCOUNTER — Ambulatory Visit: Payer: BLUE CROSS/BLUE SHIELD | Admitting: Gastroenterology

## 2017-01-30 ENCOUNTER — Ambulatory Visit: Payer: BLUE CROSS/BLUE SHIELD

## 2017-02-05 ENCOUNTER — Telehealth: Payer: Self-pay

## 2017-02-05 NOTE — Telephone Encounter (Signed)
Pt contacted office this am stated that [facility] has approved the CT scan.  I informed him that we never received the letter from his insurance company. He provided me with precert # [id number] The number to contact the insurance [phone]   Option 2 then 3.  Thanks [employer]

## 2017-02-07 ENCOUNTER — Other Ambulatory Visit: Payer: Self-pay

## 2017-02-10 ENCOUNTER — Ambulatory Visit
Admission: RE | Admit: 2017-02-10 | Discharge: 2017-02-10 | Disposition: A | Discharge status: Home / Self Care | Payer: BLUE CROSS/BLUE SHIELD | Source: Ambulatory Visit | Attending: Gastroenterology | Admitting: Gastroenterology

## 2017-02-10 DIAGNOSIS — R748 Abnormal levels of other serum enzymes: Secondary | ICD-10-CM | POA: Diagnosis present

## 2017-02-10 DIAGNOSIS — K7689 Other specified diseases of liver: Secondary | ICD-10-CM | POA: Diagnosis not present

## 2017-02-10 DIAGNOSIS — R7989 Other specified abnormal findings of blood chemistry: Secondary | ICD-10-CM | POA: Diagnosis not present

## 2017-02-10 DIAGNOSIS — R911 Solitary pulmonary nodule: Secondary | ICD-10-CM | POA: Insufficient documentation

## 2017-02-10 MED ORDER — IOPAMIDOL (ISOVUE-300) INJECTION 61%
100.0000 mL | Freq: Once | INTRAVENOUS | Status: AC | PRN
  Administered 2017-02-10: 100 mL via INTRAVENOUS

## 2017-02-12 ENCOUNTER — Telehealth: Payer: Self-pay | Admitting: Gastroenterology

## 2017-02-12 NOTE — Telephone Encounter (Signed)
Please call patient today. He needs to ask you something about his report that he saw on My Chart. I scheduled him for Monday

## 2017-02-12 NOTE — Telephone Encounter (Signed)
Pt stated that he needs to know more about hemochromatosis and Hepatobiliary: 14 mm cyst posteriorly in segment 2 (series 2/ image 18). Indicated on CT Scan.  He is concerned about this.  Thanks [employer]

## 2017-02-17 ENCOUNTER — Ambulatory Visit (INDEPENDENT_AMBULATORY_CARE_PROVIDER_SITE_OTHER): Payer: BLUE CROSS/BLUE SHIELD | Admitting: Gastroenterology

## 2017-02-17 ENCOUNTER — Other Ambulatory Visit
Admission: RE | Admit: 2017-02-17 | Discharge: 2017-02-17 | Disposition: A | Discharge status: Home / Self Care | Payer: BLUE CROSS/BLUE SHIELD | Source: Ambulatory Visit | Attending: Gastroenterology | Admitting: Gastroenterology

## 2017-02-17 ENCOUNTER — Encounter: Payer: Self-pay | Admitting: Gastroenterology

## 2017-02-17 DIAGNOSIS — R718 Other abnormality of red blood cells: Secondary | ICD-10-CM

## 2017-02-17 DIAGNOSIS — R748 Abnormal levels of other serum enzymes: Secondary | ICD-10-CM

## 2017-02-17 DIAGNOSIS — R7989 Other specified abnormal findings of blood chemistry: Secondary | ICD-10-CM

## 2017-02-17 DIAGNOSIS — D582 Other hemoglobinopathies: Secondary | ICD-10-CM

## 2017-02-17 LAB — HEPATIC FUNCTION PANEL
ALBUMIN: 4.9 g/dL (ref 3.5–5.0)
ALK PHOS: 43 U/L (ref 38–126)
ALT: 49 U/L (ref 17–63)
AST: 30 U/L (ref 15–41)
BILIRUBIN TOTAL: 1.9 mg/dL — AB (ref 0.3–1.2)
Bilirubin, Direct: 0.2 mg/dL (ref 0.1–0.5)
Indirect Bilirubin: 1.7 mg/dL — ABNORMAL HIGH (ref 0.3–0.9)
TOTAL PROTEIN: 8.2 g/dL — AB (ref 6.5–8.1)

## 2017-02-17 LAB — FERRITIN: FERRITIN: 612 ng/mL — AB (ref 24–336)

## 2017-02-17 LAB — IRON AND TIBC
Iron: 110 ug/dL (ref 45–182)
Saturation Ratios: 26 % (ref 17.9–39.5)
TIBC: 426 ug/dL (ref 250–450)
UIBC: 316 ug/dL

## 2017-02-17 NOTE — Progress Notes (Signed)
Tyler Darby, MD [address]  [place]  [place], [place] [address]  Main: [phone]  Fax: [phone]    Gastroenterology Consultation  Referring Provider:     Jerrol Mcclure.,* Primary Care Physician:  Tyler Mcclure., MD Primary Gastroenterologist:  Dr. Cephas Mcclure Reason for Consultation:     Elevated transaminases        HPI:   Tyler Mcclure is a 56 y.o. y/o male referred by Dr. Rosanna Mcclure, Tyler Passe., MD  for consultation & management of Chronically elevated LFTs. Initial rise in LFTs was first noticed in 11/2014, Isolated mildly elevated ALT until 01/12/2016, followed by rise in both AST and AST in 12/2016 at 56/122. Rest of the LFTs were normal. He underwent rotator cuff surgery in 04/2015, followed by developed chest pain, and diagnosed with PE in 05/2015 for which she was on anticoagulation for 6 months. He was seen by hematology for workup of PE and it was negative, thought to be provoked. During this time he wasn't exercising much and was not eating healthy, gained weight and his weight increased from 210 pounds to 230 pounds as of 12/26/2016. He also reports 3-4 months of bloating and vague, lower abdominal discomfort. He had intervertebral disc prolapse and surgery of his lower back and has been having low back pain. Denies any other GI symptoms. He denies IV drug abuse, blood transfusions in the past, active [employer], family history of the disease or liver cancer, heavy NSAID use or excess Tylenol use or any other herbal supplements.  His weight down to 226lbs today following strict healthy diet and regular exercise, 235lbs 1 month ago, was 210lbs in 12/2015  NSAIDs: ASA 81, stopped 2 weeks ago, occassional ibuprofen  Antiplts/Anticoagulants/Anti thrombotics: xarelto until 10/2015 for 6 months for PE  Father deceased at age 38 from pancreatic cancer Mother from ruptured colon cancer at age 55  ETOH stopped 1 month ago, used to drink 3-4 beers  during weekends  Follow-up visit 02/17/2017: Since last visit, patient lost about 15 pounds by cutting back on high calorie foods and red meat combined with exercise.  He had a CT pancreas protocol which did not reveal any pancreatic tumor.  He did have elevated ferritin and HFE gene mutation came back negative.  He denies any symptoms today and accompanied by his wife.  Rest of the secondary liver disease workup came back negative  GI Procedures: Colonoscopy at age 32  Past Medical History:  Diagnosis Date  . Arthritis   . Pulmonary embolus [facility])     Past Surgical History:  Procedure Laterality Date  . BICEPS TENDON REPAIR    . RESECTION DISTAL CLAVICAL Left 04/27/2015   Procedure: DISTAL CLAVICLE EXCISION;  Surgeon: [name], MD;  Location: [place];  Service: Orthopedics;  Laterality: Left;  . SHOULDER ARTHROSCOPY WITH ROTATOR CUFF REPAIR AND SUBACROMIAL DECOMPRESSION Left 04/27/2015   Procedure: LEFT SHOULDER ARTHROSCOPY WITH DEBRIDEMENT, ACROMIOPLASTY, ROTATOR CUFF REPAIR;  Surgeon: [name], MD;  Location: [place];  Service: Orthopedics;  Laterality: Left;  . SHOULDER SURGERY       Current Outpatient Medications:  .  aspirin EC 81 MG tablet, Take 81 mg by mouth daily., Disp: , Rfl:  .  Cholecalciferol (VITAMIN D) 2000 UNITS CAPS, Take 1 capsule by mouth daily., Disp: , Rfl:  .  desonide (DESOWEN) 0.05 % cream, apply to affected area twice a day if needed, Disp: ,  Rfl: 0 .  hydrocortisone (ANUSOL-HC) 2.5 % rectal cream, Place 1 application rectally 2 (two) times daily., Disp: 30 g, Rfl: 12 .  MULTIPLE VITAMIN PO, Take 1 tablet by mouth daily., Disp: , Rfl:  .  Omega-3 Fatty Acids (FISH OIL BURP-LESS) 1200 MG CAPS, Take 2 capsules by mouth daily., Disp: , Rfl:  .  vitamin E 400 UNIT capsule, Take 1 capsule by mouth daily., Disp: , Rfl:    Family History  Problem Relation Age of Onset  . Breast cancer Mother   . Hyperlipidemia  Mother   . Pancreatic cancer Father   . Healthy Sister   . Healthy Daughter   . Healthy Son      Social History   Tobacco Use  . Smoking status: Never Smoker  . Smokeless tobacco: Former Network engineer Use Topics  . Alcohol use: No    Alcohol/week: 0.0 oz    Comment: OCCASIONALLY  . Drug use: No    Allergies as of 02/17/2017 - Review Complete 02/10/2017  Allergen Reaction Noted  . Mercury  11/25/2014  . Penicillins  11/25/2014    Review of Systems:    All systems reviewed and negative except where noted in HPI.   Physical Exam:  There were no vitals taken for this visit. No LMP for male patient.  General:   Alert,  Well-developed, well-nourished, pleasant and cooperative in NAD Head:  Normocephalic and atraumatic. Eyes:  Sclera clear, no icterus.   Conjunctiva pink. Ears:  Normal auditory acuity. Nose:  No deformity, discharge, or lesions. Mouth:  No deformity or lesions,oropharynx pink & moist. Neck:  Supple; no masses or thyromegaly. Lungs:  Respirations even and unlabored.  Clear throughout to auscultation.   No wheezes, crackles, or rhonchi. No acute distress. Heart:  Regular rate and rhythm; no murmurs, clicks, rubs, or gallops. Abdomen:  Normal bowel sounds. Obese, soft, non-tender and non-distended without masses, hepatosplenomegaly or hernias noted.  No guarding or rebound tenderness.   Rectal: Nor performed Msk:  Symmetrical without gross deformities. Good, equal movement & strength bilaterally. Pulses:  Normal pulses noted. Extremities:  No clubbing or edema.  No cyanosis. Neurologic:  Alert and oriented x3;  grossly normal neurologically. Skin:  Intact without significant lesions or rashes. No jaundice. Lymph Nodes:  No significant cervical adenopathy. Psych:  Alert and cooperative. Normal mood and affect.  Imaging Studies: None  Assessment and Plan:   NORMON Tyler Mcclure is a 56 y.o. white male with history of PE probably provoked was on 6 months of  anticoagulation, here for follow-up of chronically elevated LFTs. Also, he has family history of pancreatic cancer.  Abnormal LFTs: Probably secondary to nonalcoholic fatty liver disease He lost 15 pounds since last visit - Recheck LFTs given his weight loss from recent lifestyle changes, expect the liver enzymes to improve - Elevated ferritin: Most likely secondary to fatty liver disease.  HFE gene mutation negative.  Will check ceruloplasmin levels.  He does have elevated hemoglobin.  We will also check iron, TIBC and percent saturation, also recheck LFTs and ferritin.  I discussed with him about possible phlebotomies - We will obtain liver biopsy to evaluate for Karlene Lineman and secondary iron overload and decide for phlebotomies based on the biopsy results -  Acute hepatitis panel, AMA, ASMA, AMA, TTG negative -  NASH fibrosure pending -  NAFLD fibrosis score -1.826, significant fibrosis unlikely (93% negative predictive value) -  CT A/P did not reveal evidence of cirrhosis, portal hypertension  Family history of pancreatic cancer in first-degree relative.  - CT pancreas protocol did not reveal any pancreatic lesions   Follow up in 2 months   Tyler Darby, MD

## 2017-02-18 ENCOUNTER — Telehealth: Payer: Self-pay

## 2017-02-18 LAB — CERULOPLASMIN: Ceruloplasmin: 19.7 mg/dL (ref 16.0–31.0)

## 2017-02-18 LAB — ALPHA-1-ANTITRYPSIN: A-1 Antitrypsin, Ser: 124 mg/dL (ref 90–200)

## 2017-02-18 NOTE — Telephone Encounter (Signed)
Patient has been informed that I have faxed liver biopsy order to [facility] to be scheduled and I will contact him once we receive an appt date.  Thanks [employer]

## 2017-02-19 LAB — MISC LABCORP TEST (SEND OUT): Labcorp test code: 550410

## 2017-02-20 ENCOUNTER — Telehealth: Payer: Self-pay

## 2017-02-20 NOTE — Telephone Encounter (Signed)
Pt notified of Liver Biopsy scheduled Monday 03/03/17 [place] arrival time 10 am for 11 am appt.  Thanks [employer]

## 2017-02-28 ENCOUNTER — Other Ambulatory Visit: Payer: Self-pay | Admitting: Radiology

## 2017-03-03 ENCOUNTER — Ambulatory Visit
Admission: RE | Admit: 2017-03-03 | Discharge: 2017-03-03 | Disposition: A | Discharge status: Home / Self Care | Payer: BLUE CROSS/BLUE SHIELD | Source: Ambulatory Visit | Attending: Gastroenterology | Admitting: Gastroenterology

## 2017-03-03 DIAGNOSIS — Z7982 Long term (current) use of aspirin: Secondary | ICD-10-CM | POA: Insufficient documentation

## 2017-03-03 DIAGNOSIS — K7581 Nonalcoholic steatohepatitis (NASH): Secondary | ICD-10-CM | POA: Diagnosis not present

## 2017-03-03 DIAGNOSIS — R749 Abnormal serum enzyme level, unspecified: Secondary | ICD-10-CM | POA: Insufficient documentation

## 2017-03-03 DIAGNOSIS — R748 Abnormal levels of other serum enzymes: Secondary | ICD-10-CM

## 2017-03-03 DIAGNOSIS — Z86711 Personal history of pulmonary embolism: Secondary | ICD-10-CM | POA: Insufficient documentation

## 2017-03-03 DIAGNOSIS — R790 Abnormal level of blood mineral: Secondary | ICD-10-CM | POA: Diagnosis not present

## 2017-03-03 DIAGNOSIS — R945 Abnormal results of liver function studies: Secondary | ICD-10-CM | POA: Diagnosis not present

## 2017-03-03 LAB — CBC WITH DIFFERENTIAL/PLATELET
BASOS ABS: 0 10*3/uL (ref 0–0.1)
BASOS PCT: 1 %
EOS PCT: 2 %
Eosinophils Absolute: 0.1 10*3/uL (ref 0–0.7)
HCT: 51.1 % (ref 40.0–52.0)
Hemoglobin: 17.4 g/dL (ref 13.0–18.0)
Lymphocytes Relative: 31 %
Lymphs Abs: 1.5 10*3/uL (ref 1.0–3.6)
MCH: 30.5 pg (ref 26.0–34.0)
MCHC: 34 g/dL (ref 32.0–36.0)
MCV: 89.8 fL (ref 80.0–100.0)
MONO ABS: 0.5 10*3/uL (ref 0.2–1.0)
Monocytes Relative: 11 %
Neutro Abs: 2.6 10*3/uL (ref 1.4–6.5)
Neutrophils Relative %: 55 %
PLATELETS: 166 10*3/uL (ref 150–440)
RBC: 5.69 MIL/uL (ref 4.40–5.90)
RDW: 12.7 % (ref 11.5–14.5)
WBC: 4.8 10*3/uL (ref 3.8–10.6)

## 2017-03-03 LAB — COMPREHENSIVE METABOLIC PANEL
ALT: 36 U/L (ref 17–63)
AST: 30 U/L (ref 15–41)
Albumin: 4.7 g/dL (ref 3.5–5.0)
Alkaline Phosphatase: 47 U/L (ref 38–126)
Anion gap: 11 (ref 5–15)
BILIRUBIN TOTAL: 1.9 mg/dL — AB (ref 0.3–1.2)
BUN: 18 mg/dL (ref 6–20)
CHLORIDE: 104 mmol/L (ref 101–111)
CO2: 25 mmol/L (ref 22–32)
CREATININE: 0.98 mg/dL (ref 0.61–1.24)
Calcium: 9.3 mg/dL (ref 8.9–10.3)
Glucose, Bld: 105 mg/dL — ABNORMAL HIGH (ref 65–99)
Potassium: 4 mmol/L (ref 3.5–5.1)
Sodium: 140 mmol/L (ref 135–145)
TOTAL PROTEIN: 8 g/dL (ref 6.5–8.1)

## 2017-03-03 LAB — PROTIME-INR
INR: 0.92
PROTHROMBIN TIME: 12.3 s (ref 11.4–15.2)

## 2017-03-03 MED ORDER — OXYCODONE HCL 5 MG PO TABS: 5.0000 mg | ORAL_TABLET | ORAL | Status: DC | PRN

## 2017-03-03 MED ORDER — FENTANYL CITRATE (PF) 100 MCG/2ML IJ SOLN
INTRAMUSCULAR | Status: AC
  Filled 2017-03-03: qty 2

## 2017-03-03 MED ORDER — MIDAZOLAM HCL 2 MG/2ML IJ SOLN
INTRAMUSCULAR | Status: AC | PRN
  Administered 2017-03-03 (×2): 1 mg via INTRAVENOUS

## 2017-03-03 MED ORDER — SODIUM CHLORIDE 0.9 % IV SOLN
INTRAVENOUS | Status: DC
  Administered 2017-03-03: 10:00:00 via INTRAVENOUS

## 2017-03-03 MED ORDER — FENTANYL CITRATE (PF) 100 MCG/2ML IJ SOLN
INTRAMUSCULAR | Status: AC | PRN
  Administered 2017-03-03 (×2): 50 ug via INTRAVENOUS

## 2017-03-03 MED ORDER — MIDAZOLAM HCL 2 MG/2ML IJ SOLN
INTRAMUSCULAR | Status: AC
  Filled 2017-03-03: qty 2

## 2017-03-03 NOTE — Consult Note (Signed)
Chief Complaint: Patient was seen in consultation today for ultrasound-guided biopsy at the request of Lin Landsman  Referring Physician(s): Lin Landsman  Patient Status: [facility] - Out-pt  History of Present Illness: Tyler Mcclure is a 56 y.o. male with history of a elevated liver function tests. Patient is being worked up for NASH.  Patient has no complaints. Specifically, he has no pain, fevers, chills, breathing problems, chest pain or abdominal pain. No issues with his GI or GU tract.  Past Medical History:  Diagnosis Date  . Arthritis   . Pulmonary embolus [facility])     Past Surgical History:  Procedure Laterality Date  . BICEPS TENDON REPAIR    . RESECTION DISTAL CLAVICAL Left 04/27/2015   Procedure: DISTAL CLAVICLE EXCISION;  Surgeon: [name], MD;  Location: [place];  Service: Orthopedics;  Laterality: Left;  . SHOULDER ARTHROSCOPY WITH ROTATOR CUFF REPAIR AND SUBACROMIAL DECOMPRESSION Left 04/27/2015   Procedure: LEFT SHOULDER ARTHROSCOPY WITH DEBRIDEMENT, ACROMIOPLASTY, ROTATOR CUFF REPAIR;  Surgeon: [name], MD;  Location: [place];  Service: Orthopedics;  Laterality: Left;  . SHOULDER SURGERY      Allergies: Mercury and Penicillins  Medications: Prior to Admission medications   Medication Sig Start Date End Date Taking? Authorizing Provider  aspirin EC 81 MG tablet Take 81 mg by mouth daily.   Yes [provider]  Cholecalciferol (VITAMIN D) 2000 UNITS CAPS Take 1 capsule by mouth daily.   Yes [provider]  desonide (DESOWEN) 0.05 % cream apply to affected area twice a day if needed 11/22/16  Yes [provider]  MULTIPLE VITAMIN PO Take 1 tablet by mouth daily.   Yes [provider]  Omega-3 Fatty Acids (FISH OIL BURP-LESS) 1200 MG CAPS Take 2 capsules by mouth daily.   Yes [provider]  vitamin E 400 UNIT capsule Take 1 capsule by mouth daily.   Yes  [provider]  hydrocortisone (ANUSOL-HC) 2.5 % rectal cream Place 1 application rectally 2 (two) times daily. 12/26/16   Jerrol Banana., MD     Family History  Problem Relation Age of Onset  . Breast cancer Mother   . Hyperlipidemia Mother   . Pancreatic cancer Father   . Healthy Sister   . Healthy Daughter   . Healthy Son     Social History   Socioeconomic History  . Marital status: Married    Spouse name: None  . Number of children: None  . Years of education: None  . Highest education level: None  Social Needs  . Financial resource strain: None  . Food insecurity - worry: None  . Food insecurity - inability: None  . Transportation needs - medical: None  . Transportation needs - non-medical: None  Occupational History  . None  Tobacco Use  . Smoking status: Never Smoker  . Smokeless tobacco: Former Network engineer and Sexual Activity  . Alcohol use: No    Alcohol/week: 0.0 oz    Comment: OCCASIONALLY  . Drug use: No  . Sexual activity: None  Other Topics Concern  . None  Social History Narrative  . None     Review of Systems: A 12 point ROS discussed and pertinent positives are indicated in the HPI above.  All other systems are negative.  Review of Systems  Constitutional: Negative.   Respiratory: Negative.   Cardiovascular: Negative.   Gastrointestinal: Negative.   Genitourinary: Negative.     Vital  Signs: BP (!) 129/91   Pulse 81   Temp 98.3 F (36.8 C) (Oral)   Resp 17   Ht 5' 11"  (1.803 m)   Wt 210 lb (95.3 kg)   SpO2 97%   BMI 29.29 kg/m   Physical Exam  Constitutional: He appears well-nourished. No distress.  HENT:  Mouth/Throat: Oropharynx is clear and moist.  Cardiovascular: Normal rate, regular rhythm and normal heart sounds.  Pulmonary/Chest: Effort normal and breath sounds normal.  Abdominal: Soft. Bowel sounds are normal.  Musculoskeletal: He exhibits no edema.    Imaging: Ct Abdomen W Contrast  Result  Date: 02/10/2017 CLINICAL DATA:  Elevated LFTs EXAM: CT ABDOMEN WITH CONTRAST TECHNIQUE: Multidetector CT imaging of the abdomen was performed using the standard protocol following bolus administration of intravenous contrast. CONTRAST:  172m ISOVUE-300 IOPAMIDOL (ISOVUE-300) INJECTION 61% COMPARISON:  MRI abdomen dated 10/27/2003. CT abdomen dated 08/17/2003. FINDINGS: Lower chest: 4 mm subpleural nodule in the left lower lobe (series 4/ image 3). Hepatobiliary: 14 mm cyst posteriorly in segment 2 (series 2/ image 18). Gallbladder is unremarkable. No intrahepatic or extrahepatic ductal dilatation. Pancreas: Within normal limits. Spleen: Within normal limits. Adrenals/Urinary Tract: Adrenal glands within normal limits. Kidneys are within normal limits.  No hydronephrosis. Stomach/Bowel: Stomach is within normal limits. Visualized bowel is unremarkable, noting a normal appendix (series 2/image 52). Vascular/Lymphatic: No evidence of abdominal aortic aneurysm. Atherosclerotic calcifications of the abdominal aorta and branch vessels. No suspicious abdominal lymphadenopathy. Other: No abdominal ascites. Musculoskeletal: Mild degenerative changes of the visualized thoracolumbar spine. IMPRESSION: 14 mm hepatic cyst in segment 2, benign. 4 mm subpleural nodule in the left lower lobe. No follow-up needed if patient is low-risk. Non-contrast chest CT can be considered in 12 months if patient is high-risk. This recommendation follows the consensus statement: Guidelines for Management of Incidental Pulmonary Nodules Detected on CT Images: From the Fleischner Society 2017; Radiology 2017; 284:228-243. Electronically Signed   By: SJulian HyM.D.   On: 02/10/2017 14:02    Labs:  CBC: Recent Labs    12/26/16 0944 03/03/17 0938  WBC 5.1 4.8  HGB 17.7* 17.4  HCT 51.7* 51.1  PLT 201 166    COAGS: Recent Labs    03/03/17 0938  INR 0.92    BMP: Recent Labs    12/26/16 0944 03/03/17 0938  NA 140 140   K 4.3 4.0  CL 106 104  CO2 25 25  GLUCOSE 111* 105*  BUN 16 18  CALCIUM 9.5 9.3  CREATININE 0.79 0.98  GFRNONAA  --  >60  GFRAA  --  >60    LIVER FUNCTION TESTS: Recent Labs    12/26/16 0944 01/21/17 1624 02/17/17 1142 03/03/17 0938  BILITOT 1.1 1.3* 1.9* 1.9*  AST 56* 48* 30 30  ALT 122* 84* 49 36  ALKPHOS  --  44 43 47  PROT 7.6 8.0 8.2* 8.0  ALBUMIN  --  4.8 4.9 4.7    TUMOR MARKERS: No results for input(s): AFPTM, CEA, CA199, CHROMGRNA in the last 8760 hours.  Assessment and Plan:  56year old with abnormal liver function tests. Scheduled for ultrasound-guided liver biopsy. Liver biopsy was moderate sedation was discussed with the patient. The risks include bleeding, infection and respiratory distress. Patient is agreeable to the procedure and informed consent was obtained. Plan for ultrasound-guided random liver biopsy with moderate sedation.  Thank you for this interesting consult.  I greatly enjoyed meeting RMONTEY EBELand look forward to participating in their care.  A  copy of this report was sent to the requesting provider on this date.  Electronically Signed: Burman Riis, MD 03/03/2017, 11:14 AM   I spent a total of  15 Minutes   in face to face in clinical consultation, greater than 50% of which was counseling/coordinating care for ultrasound-guided liver biopsy.

## 2017-03-05 LAB — SURGICAL PATHOLOGY

## 2017-03-11 ENCOUNTER — Telehealth: Payer: Self-pay | Admitting: Gastroenterology

## 2017-03-11 NOTE — Telephone Encounter (Signed)
Please call Tyler Mcclure regarding liver biopsy results. Received on MyChart but doesn't understand results.

## 2017-03-11 NOTE — Telephone Encounter (Signed)
Dr. Bonna Gains, please review patients liver biopsy results. He is a ptatient of Dr. Verlin Grills.  He has seen the results in his "MyChart".  Results stated "Mild Steatohepatitis".    His questions are as follows:  1. Could this diagnosis be the result of Xarelto?  This is when his liver enzymes started becoming elevated.  He is no longer on Xarelto.  2. Is this anything that he should be really concerned about? Life Threatening?  3. What exactly is Steatohepatitis?  4. Treatment? Monitoring?   5. Can we provide patient education to him regarding this diagnosis.  Thank you Dr. Bonna Gains, I appreciate your assistance and Sherren Mocha does as well.  You may respond to this in a MyChart message.

## 2017-03-31 ENCOUNTER — Ambulatory Visit: Payer: BLUE CROSS/BLUE SHIELD | Admitting: Family Medicine

## 2017-04-08 ENCOUNTER — Encounter: Payer: Self-pay | Admitting: Gastroenterology

## 2017-04-08 ENCOUNTER — Ambulatory Visit: Payer: BLUE CROSS/BLUE SHIELD | Admitting: Gastroenterology

## 2017-04-08 VITALS — BP 130/87 | HR 85 | Temp 98.2°F | Ht 72.0 in | Wt 207.0 lb

## 2017-04-08 DIAGNOSIS — K7581 Nonalcoholic steatohepatitis (NASH): Secondary | ICD-10-CM

## 2017-04-08 NOTE — Progress Notes (Signed)
Tyler Darby, MD [address]  [place]  [place], [place] [address]  Main: [phone]  Fax: [phone]    Gastroenterology Consultation  Referring Provider:     Jerrol Banana.,* Primary Care Physician:  Jerrol Banana., MD Primary Gastroenterologist:  Dr. Cephas Mcclure Reason for Consultation:     Elevated transaminases        HPI:   Tyler Mcclure is a 57 y.o. y/o male referred by Dr. [name], [name]., MD  for consultation & management of Chronically elevated LFTs. Initial rise in LFTs was first noticed in 11/2014, Isolated mildly elevated ALT until 01/12/2016, followed by rise in both AST and AST in 12/2016 at 56/122. Rest of the LFTs were normal. He underwent rotator cuff surgery in 04/2015, followed by developed chest pain, and diagnosed with PE in 05/2015 for which she was on anticoagulation for 6 months. He was seen by hematology for workup of PE and it was negative, thought to be provoked. During this time he wasn't exercising much and was not eating healthy, gained weight and his weight increased from 210 pounds to 230 pounds as of 12/26/2016. He also reports 3-4 months of bloating and vague, lower abdominal discomfort. He had intervertebral disc prolapse and surgery of his lower back and has been having low back pain. Denies any other GI symptoms. He denies IV drug abuse, blood transfusions in the past, active [employer], family history of the disease or liver cancer, heavy NSAID use or excess Tylenol use or any other herbal supplements.  His weight down to 226lbs today following strict healthy diet and regular exercise, 235lbs 1 month ago, was 210lbs in 12/2015  NSAIDs: ASA 81, stopped 2 weeks ago, occassional ibuprofen  Antiplts/Anticoagulants/Anti thrombotics: xarelto until 10/2015 for 6 months for PE  Father deceased at age 3 from pancreatic cancer Mother from ruptured colon cancer at age 35  ETOH stopped 1 month ago, used to drink 3-4 beers  during weekends  Follow-up visit 02/17/2017: Since last visit, patient lost about 15 pounds by cutting back on high calorie foods and red meat combined with exercise.  He had a CT pancreas protocol which did not reveal any pancreatic tumor.  He did have elevated ferritin and HFE gene mutation came back negative.  He denies any symptoms today and accompanied by his wife.  Rest of the secondary liver disease workup came back negative  Follow-up visit 04/08/2017: Since last visit, patient underwent liver biopsy which revealed grade 1, stage I steatohepatitis. He also lost about 30 pounds. His BMI is 28 today. His LFTs are normal and ferritin is trending down. He has been working out daily and also reduced diet soda intake from 3-4 per day to 1 per day. He completely avoided red meat and replaced it with chicken. He also cut back on fast foods.  GI Procedures: Colonoscopy at age 70, reportedly normal  Liver biopsy 03/03/2017 DIAGNOSIS:  A. LIVER, RIGHT HEPATIC LOBE; BIOPSY:  - MILD STEATOHEPATITIS, GRADE 1 (OF 3), STAGE OF 1 (OF 4) BRUNT  CLASSIFICATION.  Past Medical History:  Diagnosis Date  . Arthritis   . Pulmonary embolus [facility])     Past Surgical History:  Procedure Laterality Date  . BICEPS TENDON REPAIR    . RESECTION DISTAL CLAVICAL Left 04/27/2015   Procedure: DISTAL CLAVICLE EXCISION;  Surgeon: [name], MD;  Location: [place];  Service: Orthopedics;  Laterality: Left;  . SHOULDER ARTHROSCOPY WITH  ROTATOR CUFF REPAIR AND SUBACROMIAL DECOMPRESSION Left 04/27/2015   Procedure: LEFT SHOULDER ARTHROSCOPY WITH DEBRIDEMENT, ACROMIOPLASTY, ROTATOR CUFF REPAIR;  Surgeon: [name], MD;  Location: [place];  Service: Orthopedics;  Laterality: Left;  . SHOULDER SURGERY       Current Outpatient Medications:  .  aspirin EC 81 MG tablet, Take 81 mg by mouth daily., Disp: , Rfl:  .  Cholecalciferol (VITAMIN D) 2000 UNITS CAPS, Take 1 capsule by  mouth daily., Disp: , Rfl:  .  desonide (DESOWEN) 0.05 % cream, apply to affected area twice a day if needed, Disp: , Rfl: 0 .  hydrocortisone (ANUSOL-HC) 2.5 % rectal cream, Place 1 application rectally 2 (two) times daily., Disp: 30 g, Rfl: 12 .  MULTIPLE VITAMIN PO, Take 1 tablet by mouth daily., Disp: , Rfl:  .  Omega-3 Fatty Acids (FISH OIL BURP-LESS) 1200 MG CAPS, Take 2 capsules by mouth daily., Disp: , Rfl:  .  vitamin E 400 UNIT capsule, Take 1 capsule by mouth daily., Disp: , Rfl:    Family History  Problem Relation Age of Onset  . Breast cancer Mother   . Hyperlipidemia Mother   . Pancreatic cancer Father   . Healthy Sister   . Healthy Daughter   . Healthy Son      Social History   Tobacco Use  . Smoking status: Never Smoker  . Smokeless tobacco: Former Network engineer Use Topics  . Alcohol use: No    Alcohol/week: 0.0 oz    Comment: OCCASIONALLY  . Drug use: No    Allergies as of 04/08/2017 - Review Complete 04/08/2017  Allergen Reaction Noted  . Mercury  11/25/2014  . Penicillins  11/25/2014    Review of Systems:    All systems reviewed and negative except where noted in HPI.   Physical Exam:  BP 130/87   Pulse 85   Temp 98.2 F (36.8 C) (Oral)   Ht 6' (1.829 m)   Wt 207 lb (93.9 kg)   BMI 28.07 kg/m  No LMP for male patient.  General:   Alert,  Well-developed, well-nourished, pleasant and cooperative in NAD Head:  Normocephalic and atraumatic. Eyes:  Sclera clear, no icterus.   Conjunctiva pink. Ears:  Normal auditory acuity. Nose:  No deformity, discharge, or lesions. Mouth:  No deformity or lesions,oropharynx pink & moist. Neck:  Supple; no masses or thyromegaly. Lungs:  Respirations even and unlabored.  Clear throughout to auscultation.   No wheezes, crackles, or rhonchi. No acute distress. Heart:  Regular rate and rhythm; no murmurs, clicks, rubs, or gallops. Abdomen:  Normal bowel sounds. Obese, soft, non-tender and non-distended without  masses, hepatosplenomegaly or hernias noted.  No guarding or rebound tenderness.   Rectal: Nor performed Msk:  Symmetrical without gross deformities. Good, equal movement & strength bilaterally. Pulses:  Normal pulses noted. Extremities:  No clubbing or edema.  No cyanosis. Neurologic:  Alert and oriented x3;  grossly normal neurologically. Skin:  Intact without significant lesions or rashes. No jaundice. Lymph Nodes:  No significant cervical adenopathy. Psych:  Alert and cooperative. Normal mood and affect.  Imaging Studies: None  Assessment and Plan:   ERIVERTO BYRNES is a 57 y.o. white male with history of PE probably provoked was on 6 months of anticoagulation, here for follow-up of NASH. Also, he has family history of pancreatic cancer.  NASH: Stage I, grade 1, Confirmed by liver biopsy  LFTs are normal, ferritin improving Had significant weight  loss secondary to diet and exercise - Acute hepatitis panel, AMA, ASMA, AMA, TTG negative - NAFLD fibrosis score -1.826, significant fibrosis unlikely (93% negative predictive value) - CT A/P did not reveal evidence of cirrhosis, portal hypertension - Recheck LFTs and ferritin in 3 months - Elevated ferritin: secondary to fatty liver disease. HFE gene mutation negative.    Family history of pancreatic cancer in first-degree relative.  - CT pancreas protocol did not reveal any pancreatic lesions   Follow up as needed   Tyler Darby, MD

## 2017-06-04 ENCOUNTER — Telehealth: Payer: Self-pay | Admitting: Gastroenterology

## 2017-06-04 DIAGNOSIS — R7989 Other specified abnormal findings of blood chemistry: Secondary | ICD-10-CM

## 2017-06-04 DIAGNOSIS — R945 Abnormal results of liver function studies: Principal | ICD-10-CM

## 2017-06-04 NOTE — Telephone Encounter (Signed)
Please advise on pts labs to be rechecked.  Thanks. Sharyn Lull

## 2017-06-04 NOTE — Telephone Encounter (Signed)
PT LEFT VM FOR MICHELLE , HE STATES HE NEEDS BLOOD WORK DONE THIS WEEK IF POSS TO GET HIS LIVER # CHECKED PLEASE CALL # [phone]

## 2017-06-05 ENCOUNTER — Telehealth: Payer: Self-pay

## 2017-06-05 NOTE — Telephone Encounter (Signed)
Patient stated that he will go to [facility] tomorrow for his LFT, and Ferritin.  Thanks [employer]

## 2017-06-06 ENCOUNTER — Other Ambulatory Visit
Admission: RE | Admit: 2017-06-06 | Discharge: 2017-06-06 | Disposition: A | Discharge status: Home / Self Care | Payer: BLUE CROSS/BLUE SHIELD | Source: Ambulatory Visit | Attending: Gastroenterology | Admitting: Gastroenterology

## 2017-06-06 DIAGNOSIS — R945 Abnormal results of liver function studies: Secondary | ICD-10-CM | POA: Diagnosis not present

## 2017-06-06 DIAGNOSIS — R7989 Other specified abnormal findings of blood chemistry: Secondary | ICD-10-CM

## 2017-06-06 LAB — COMPREHENSIVE METABOLIC PANEL
ALBUMIN: 4.7 g/dL (ref 3.5–5.0)
ALK PHOS: 46 U/L (ref 38–126)
ALT: 29 U/L (ref 17–63)
ANION GAP: 10 (ref 5–15)
AST: 24 U/L (ref 15–41)
BILIRUBIN TOTAL: 2.3 mg/dL — AB (ref 0.3–1.2)
BUN: 20 mg/dL (ref 6–20)
CALCIUM: 9.2 mg/dL (ref 8.9–10.3)
CO2: 23 mmol/L (ref 22–32)
Chloride: 105 mmol/L (ref 101–111)
Creatinine, Ser: 0.79 mg/dL (ref 0.61–1.24)
GFR calc Af Amer: >60 mL/min (ref >60)
GFR calc non Af Amer: >60 mL/min (ref >60)
Glucose, Bld: 99 mg/dL (ref 65–99)
Potassium: 3.9 mmol/L (ref 3.5–5.1)
SODIUM: 138 mmol/L (ref 135–145)
TOTAL PROTEIN: 7.9 g/dL (ref 6.5–8.1)

## 2017-06-06 LAB — FERRITIN: Ferritin: 563 ng/mL — ABNORMAL HIGH (ref 24–336)

## 2017-07-16 DIAGNOSIS — L570 Actinic keratosis: Secondary | ICD-10-CM | POA: Diagnosis not present

## 2017-07-16 DIAGNOSIS — D2262 Melanocytic nevi of left upper limb, including shoulder: Secondary | ICD-10-CM | POA: Diagnosis not present

## 2017-07-16 DIAGNOSIS — L400 Psoriasis vulgaris: Secondary | ICD-10-CM | POA: Diagnosis not present

## 2017-07-16 DIAGNOSIS — D2261 Melanocytic nevi of right upper limb, including shoulder: Secondary | ICD-10-CM | POA: Diagnosis not present

## 2017-07-16 DIAGNOSIS — D225 Melanocytic nevi of trunk: Secondary | ICD-10-CM | POA: Diagnosis not present

## 2017-07-16 DIAGNOSIS — X32XXXA Exposure to sunlight, initial encounter: Secondary | ICD-10-CM | POA: Diagnosis not present

## 2017-10-06 DIAGNOSIS — H6123 Impacted cerumen, bilateral: Secondary | ICD-10-CM | POA: Diagnosis not present

## 2017-10-06 DIAGNOSIS — J301 Allergic rhinitis due to pollen: Secondary | ICD-10-CM | POA: Diagnosis not present

## 2017-10-06 DIAGNOSIS — H6980 Other specified disorders of Eustachian tube, unspecified ear: Secondary | ICD-10-CM | POA: Diagnosis not present

## 2017-12-30 ENCOUNTER — Ambulatory Visit (INDEPENDENT_AMBULATORY_CARE_PROVIDER_SITE_OTHER): Payer: BLUE CROSS/BLUE SHIELD | Admitting: Family Medicine

## 2017-12-30 VITALS — BP 131/83 | HR 87 | Temp 98.8°F | Resp 16 | Ht 72.0 in | Wt 226.0 lb

## 2017-12-30 DIAGNOSIS — K644 Residual hemorrhoidal skin tags: Secondary | ICD-10-CM

## 2017-12-30 DIAGNOSIS — Z125 Encounter for screening for malignant neoplasm of prostate: Secondary | ICD-10-CM

## 2017-12-30 DIAGNOSIS — Z0000 Encounter for general adult medical examination without abnormal findings: Secondary | ICD-10-CM | POA: Diagnosis not present

## 2017-12-30 MED ORDER — HYDROCORTISONE 2.5 % RE CREA: 1.0000 "application " | TOPICAL_CREAM | Freq: Two times a day (BID) | RECTAL | 12 refills | Status: DC

## 2017-12-30 NOTE — Progress Notes (Signed)
Patient: Tyler Mcclure, Male    DOB: 06/30/1960, 57 y.o.   MRN: [id number] Visit Date: 12/30/2017  Today's Provider: [name], MD   Chief Complaint  Patient presents with  . Annual Exam   Subjective:  Tyler Mcclure is a 57 y.o. male who presents today for health maintenance and complete physical. He feels well. He reports exercising daily. He reports he is sleeping well.  02/24/12 Colonoscopy, Dr Pennelope Bracken 10 years  Review of Systems  Constitutional: Negative.   HENT: Negative.   Eyes: Negative.   Respiratory: Negative.   Cardiovascular: Negative.   Gastrointestinal: Negative.   Endocrine: Negative.   Genitourinary: Negative.   Musculoskeletal: Positive for back pain.  Skin: Positive for rash.  Allergic/Immunologic: Negative.   Neurological: Negative.   Hematological: Negative.   Psychiatric/Behavioral: Negative.     Social History   Socioeconomic History  . Marital status: Married    Spouse name: Not on file  . Number of children: Not on file  . Years of education: Not on file  . Highest education level: Not on file  Occupational History  . Not on file  Social Needs  . Financial resource strain: Not on file  . Food insecurity:    Worry: Not on file    Inability: Not on file  . Transportation needs:    Medical: Not on file    Non-medical: Not on file  Tobacco Use  . Smoking status: Never Smoker  . Smokeless tobacco: Former Network engineer and Sexual Activity  . Alcohol use: No    Alcohol/week: 0.0 standard drinks    Comment: OCCASIONALLY  . Drug use: No  . Sexual activity: Yes    Partners: Female    Comment: Married  Lifestyle  . Physical activity:    Days per week: Not on file    Minutes per session: Not on file  . Stress: Not on file  Relationships  . Social connections:    Talks on phone: Not on file    Gets together: Not on file    Attends religious service: Not on file    Active member of club or organization: Not on file    Attends  meetings of clubs or organizations: Not on file    Relationship status: Not on file  . Intimate partner violence:    Fear of current or ex partner: Not on file    Emotionally abused: Not on file    Physically abused: Not on file    Forced sexual activity: Not on file  Other Topics Concern  . Not on file  Social History Narrative  . Not on file    Patient Active Problem List   Diagnosis Date Noted  . Herniated lumbar intervertebral disc 12/26/2016  . Plantar fasciitis 12/26/2016  . Left pulmonary embolus ([place]) 10/30/2015  . Pulmonary embolus ([place]) 05/24/2015  . Arthritis 05/24/2015  . Rupture of tendon of biceps, long head 11/25/2014  . Squamous cell carcinoma of skin 11/25/2014    Past Surgical History:  Procedure Laterality Date  . BICEPS TENDON REPAIR    . RESECTION DISTAL CLAVICAL Left 04/27/2015   Procedure: DISTAL CLAVICLE EXCISION;  Surgeon: [name], MD;  Location: [place];  Service: Orthopedics;  Laterality: Left;  . SHOULDER ARTHROSCOPY WITH ROTATOR CUFF REPAIR AND SUBACROMIAL DECOMPRESSION Left 04/27/2015   Procedure: LEFT SHOULDER ARTHROSCOPY WITH DEBRIDEMENT, ACROMIOPLASTY, ROTATOR CUFF REPAIR;  Surgeon: [name], MD;  Location: [place];  Service: Orthopedics;  Laterality: Left;  . SHOULDER SURGERY      His family history includes Breast cancer in his mother; Healthy in his daughter, sister, and son; Hyperlipidemia in his mother; Pancreatic cancer in his father.     Outpatient Encounter Medications as of 12/30/2017  Medication Sig  . aspirin EC 81 MG tablet Take 81 mg by mouth daily.  . Cholecalciferol (VITAMIN D) 2000 UNITS CAPS Take 1 capsule by mouth daily.  Marland Kitchen desonide (DESOWEN) 0.05 % cream apply to affected area twice a day if needed  . hydrocortisone (ANUSOL-HC) 2.5 % rectal cream Place 1 application rectally 2 (two) times daily.  . MULTIPLE VITAMIN PO Take 1 tablet by mouth daily.  . Omega-3 Fatty Acids (FISH  OIL BURP-LESS) 1200 MG CAPS Take 2 capsules by mouth daily.  . vitamin E 400 UNIT capsule Take 1 capsule by mouth daily.   No facility-administered encounter medications on file as of 12/30/2017.     Patient Care Team: Jerrol Banana., MD as PCP - General (Family Medicine)      Objective:   Vitals:  Vitals:   12/30/17 0841  BP: 131/83  Pulse: 87  Resp: 16  Temp: 98.8 F (37.1 C)  TempSrc: Oral  Weight: 226 lb (102.5 kg)  Height: 6' (1.829 m)    Physical Exam  Constitutional: He is oriented to person, place, and time. He appears well-developed and well-nourished.  HENT:  Head: Normocephalic and atraumatic.  Right Ear: External ear normal.  Nose: Nose normal.  Mouth/Throat: Oropharynx is clear and moist.  Eyes: Pupils are equal, round, and reactive to light. Conjunctivae and EOM are normal.  Neck: Normal range of motion. Neck supple.  Cardiovascular: Normal rate, regular rhythm, normal heart sounds and intact distal pulses.  Pulmonary/Chest: Effort normal and breath sounds normal.  Abdominal: Soft. Bowel sounds are normal.  Genitourinary: Rectum normal, prostate normal and penis normal.  Musculoskeletal: Normal range of motion.  Neurological: He is alert and oriented to person, place, and time.  Skin: Skin is warm and dry.  Psychiatric: He has a normal mood and affect. His behavior is normal. Judgment and thought content normal.     Depression Screen PHQ 2/9 Scores 12/30/2017 12/26/2016 12/26/2015 09/04/2015  PHQ - 2 Score 0 0 0 0  PHQ- 9 Score - 3 - -      Assessment & Plan:     Routine Health Maintenance and Physical Exam  Exercise Activities and Dietary recommendations Goals   None     Immunization History  Administered Date(s) Administered  . Tdap 11/24/2008    Health Maintenance  Topic Date Due  . HIV Screening  04/08/1976  . INFLUENZA VACCINE  10/23/2017  . TETANUS/TDAP  11/25/2018  . COLONOSCOPY  02/23/2022  . Hepatitis C Screening   Completed     Discussed health benefits of physical activity, and encouraged him to engage in regular exercise appropriate for his age and condition. RTC 1 year.   I have done the exam and reviewed the chart and it is accurate to the best of my knowledge. Development worker, community has been used and  any errors in dictation or transcription are unintentional. Miguel Aschoff M.D. [facility]

## 2017-12-31 LAB — COMPREHENSIVE METABOLIC PANEL
ALBUMIN: 5.2 g/dL (ref 3.5–5.5)
ALT: 86 IU/L — ABNORMAL HIGH (ref 0–44)
AST: 40 IU/L (ref 0–40)
Albumin/Globulin Ratio: 2.2 (ref 1.2–2.2)
Alkaline Phosphatase: 44 IU/L (ref 39–117)
BUN / CREAT RATIO: 21 — AB (ref 9–20)
BUN: 17 mg/dL (ref 6–24)
Bilirubin Total: 1.8 mg/dL — ABNORMAL HIGH (ref 0.0–1.2)
CALCIUM: 9.6 mg/dL (ref 8.7–10.2)
CO2: 19 mmol/L — ABNORMAL LOW (ref 20–29)
Chloride: 102 mmol/L (ref 96–106)
Creatinine, Ser: 0.82 mg/dL (ref 0.76–1.27)
GFR calc Af Amer: 114 mL/min/{1.73_m2} (ref >59)
GFR, EST NON AFRICAN AMERICAN: 99 mL/min/{1.73_m2} (ref >59)
GLOBULIN, TOTAL: 2.4 g/dL (ref 1.5–4.5)
Glucose: 95 mg/dL (ref 65–99)
Potassium: 4.4 mmol/L (ref 3.5–5.2)
SODIUM: 141 mmol/L (ref 134–144)
TOTAL PROTEIN: 7.6 g/dL (ref 6.0–8.5)

## 2017-12-31 LAB — CBC WITH DIFFERENTIAL/PLATELET
Basophils Absolute: 0.1 10*3/uL (ref 0.0–0.2)
Basos: 1 %
EOS (ABSOLUTE): 0.2 10*3/uL (ref 0.0–0.4)
EOS: 3 %
HEMATOCRIT: 50.8 % (ref 37.5–51.0)
HEMOGLOBIN: 17.4 g/dL (ref 13.0–17.7)
IMMATURE GRANS (ABS): 0 10*3/uL (ref 0.0–0.1)
IMMATURE GRANULOCYTES: 0 %
LYMPHS ABS: 2 10*3/uL (ref 0.7–3.1)
Lymphs: 35 %
MCH: 30.5 pg (ref 26.6–33.0)
MCHC: 34.3 g/dL (ref 31.5–35.7)
MCV: 89 fL (ref 79–97)
MONOCYTES: 12 %
Monocytes Absolute: 0.7 10*3/uL (ref 0.1–0.9)
Neutrophils Absolute: 2.7 10*3/uL (ref 1.4–7.0)
Neutrophils: 49 %
Platelets: 205 10*3/uL (ref 150–450)
RBC: 5.7 x10E6/uL (ref 4.14–5.80)
RDW: 12.1 % — ABNORMAL LOW (ref 12.3–15.4)
WBC: 5.7 10*3/uL (ref 3.4–10.8)

## 2017-12-31 LAB — LIPID PANEL WITH LDL/HDL RATIO
Cholesterol, Total: 215 mg/dL — ABNORMAL HIGH (ref 100–199)
HDL: 46 mg/dL
LDL Calculated: 113 mg/dL — ABNORMAL HIGH (ref 0–99)
LDl/HDL Ratio: 2.5 ratio (ref 0.0–3.6)
Triglycerides: 280 mg/dL — ABNORMAL HIGH (ref 0–149)
VLDL Cholesterol Cal: 56 mg/dL — ABNORMAL HIGH (ref 5–40)

## 2017-12-31 LAB — TSH: TSH: 3.41 u[IU]/mL (ref 0.450–4.500)

## 2017-12-31 LAB — PSA: Prostate Specific Ag, Serum: 0.4 ng/mL (ref 0.0–4.0)

## 2018-03-19 DIAGNOSIS — R9431 Abnormal electrocardiogram [ECG] [EKG]: Secondary | ICD-10-CM | POA: Diagnosis not present

## 2018-03-19 DIAGNOSIS — Z888 Allergy status to other drugs, medicaments and biological substances status: Secondary | ICD-10-CM | POA: Diagnosis not present

## 2018-03-19 DIAGNOSIS — K529 Noninfective gastroenteritis and colitis, unspecified: Secondary | ICD-10-CM | POA: Diagnosis not present

## 2018-03-19 DIAGNOSIS — K219 Gastro-esophageal reflux disease without esophagitis: Secondary | ICD-10-CM | POA: Diagnosis not present

## 2018-03-19 DIAGNOSIS — R1013 Epigastric pain: Secondary | ICD-10-CM | POA: Diagnosis not present

## 2018-03-19 DIAGNOSIS — R531 Weakness: Secondary | ICD-10-CM | POA: Diagnosis not present

## 2018-03-19 DIAGNOSIS — Z880 Allergy status to penicillin: Secondary | ICD-10-CM | POA: Diagnosis not present

## 2018-03-19 DIAGNOSIS — Z79899 Other long term (current) drug therapy: Secondary | ICD-10-CM | POA: Diagnosis not present

## 2018-04-06 DIAGNOSIS — Z888 Allergy status to other drugs, medicaments and biological substances status: Secondary | ICD-10-CM | POA: Diagnosis not present

## 2018-04-06 DIAGNOSIS — R0789 Other chest pain: Secondary | ICD-10-CM | POA: Diagnosis not present

## 2018-04-06 DIAGNOSIS — Z880 Allergy status to penicillin: Secondary | ICD-10-CM | POA: Diagnosis not present

## 2018-04-06 DIAGNOSIS — R079 Chest pain, unspecified: Secondary | ICD-10-CM | POA: Diagnosis not present

## 2018-04-06 DIAGNOSIS — Z79899 Other long term (current) drug therapy: Secondary | ICD-10-CM | POA: Diagnosis not present

## 2018-04-06 DIAGNOSIS — K219 Gastro-esophageal reflux disease without esophagitis: Secondary | ICD-10-CM | POA: Diagnosis not present

## 2018-07-03 DIAGNOSIS — R7301 Impaired fasting glucose: Secondary | ICD-10-CM | POA: Diagnosis not present

## 2018-07-03 DIAGNOSIS — E782 Mixed hyperlipidemia: Secondary | ICD-10-CM | POA: Diagnosis not present

## 2018-07-10 DIAGNOSIS — R7301 Impaired fasting glucose: Secondary | ICD-10-CM | POA: Diagnosis not present

## 2018-07-10 DIAGNOSIS — Z79899 Other long term (current) drug therapy: Secondary | ICD-10-CM | POA: Diagnosis not present

## 2018-07-10 DIAGNOSIS — E782 Mixed hyperlipidemia: Secondary | ICD-10-CM | POA: Diagnosis not present

## 2018-07-10 DIAGNOSIS — I10 Essential (primary) hypertension: Secondary | ICD-10-CM | POA: Diagnosis not present

## 2018-07-16 DIAGNOSIS — L400 Psoriasis vulgaris: Secondary | ICD-10-CM | POA: Diagnosis not present

## 2018-11-16 ENCOUNTER — Telehealth: Payer: Self-pay | Admitting: Family Medicine

## 2018-11-16 DIAGNOSIS — M5126 Other intervertebral disc displacement, lumbar region: Secondary | ICD-10-CM

## 2018-11-16 NOTE — Telephone Encounter (Signed)
Pt called saying he would like an order for PT with [facility]..  I told him he really needed to come in and see Dr. [name] but he wanted me to ask if he could get an order without seeing him because Dr. [name] knows his medical history  FAX order to [facility] [phone]   CB#  [phone]  [place]

## 2018-11-16 NOTE — Telephone Encounter (Signed)
Please advise 

## 2018-11-17 NOTE — Telephone Encounter (Signed)
PT for what? If back pain then yes.

## 2018-11-18 NOTE — Telephone Encounter (Signed)
Patient reports that yes it is for back pain for herniated disc. KW

## 2018-11-24 DIAGNOSIS — M5116 Intervertebral disc disorders with radiculopathy, lumbar region: Secondary | ICD-10-CM | POA: Diagnosis not present

## 2018-11-26 DIAGNOSIS — L570 Actinic keratosis: Secondary | ICD-10-CM | POA: Diagnosis not present

## 2018-11-26 DIAGNOSIS — X32XXXA Exposure to sunlight, initial encounter: Secondary | ICD-10-CM | POA: Diagnosis not present

## 2018-11-26 DIAGNOSIS — L400 Psoriasis vulgaris: Secondary | ICD-10-CM | POA: Diagnosis not present

## 2018-11-26 DIAGNOSIS — M5116 Intervertebral disc disorders with radiculopathy, lumbar region: Secondary | ICD-10-CM | POA: Diagnosis not present

## 2018-11-26 DIAGNOSIS — L821 Other seborrheic keratosis: Secondary | ICD-10-CM | POA: Diagnosis not present

## 2018-11-26 DIAGNOSIS — D2372 Other benign neoplasm of skin of left lower limb, including hip: Secondary | ICD-10-CM | POA: Diagnosis not present

## 2018-12-01 DIAGNOSIS — M5116 Intervertebral disc disorders with radiculopathy, lumbar region: Secondary | ICD-10-CM | POA: Diagnosis not present

## 2018-12-03 DIAGNOSIS — M5116 Intervertebral disc disorders with radiculopathy, lumbar region: Secondary | ICD-10-CM | POA: Diagnosis not present

## 2018-12-10 DIAGNOSIS — M5116 Intervertebral disc disorders with radiculopathy, lumbar region: Secondary | ICD-10-CM | POA: Diagnosis not present

## 2019-01-04 ENCOUNTER — Other Ambulatory Visit: Payer: Self-pay

## 2019-01-04 ENCOUNTER — Encounter: Payer: Self-pay | Admitting: Family Medicine

## 2019-01-04 ENCOUNTER — Ambulatory Visit (INDEPENDENT_AMBULATORY_CARE_PROVIDER_SITE_OTHER): Payer: BC Managed Care – PPO | Admitting: Family Medicine

## 2019-01-04 VITALS — BP 126/64 | HR 72 | Temp 98.6°F | Resp 16 | Ht 72.0 in | Wt 211.0 lb

## 2019-01-04 DIAGNOSIS — L409 Psoriasis, unspecified: Secondary | ICD-10-CM

## 2019-01-04 DIAGNOSIS — Z1211 Encounter for screening for malignant neoplasm of colon: Secondary | ICD-10-CM

## 2019-01-04 DIAGNOSIS — Z125 Encounter for screening for malignant neoplasm of prostate: Secondary | ICD-10-CM

## 2019-01-04 DIAGNOSIS — Z0000 Encounter for general adult medical examination without abnormal findings: Secondary | ICD-10-CM | POA: Diagnosis not present

## 2019-01-04 DIAGNOSIS — K7581 Nonalcoholic steatohepatitis (NASH): Secondary | ICD-10-CM

## 2019-01-04 DIAGNOSIS — Z23 Encounter for immunization: Secondary | ICD-10-CM

## 2019-01-04 LAB — IFOBT (OCCULT BLOOD): IFOBT: NEGATIVE

## 2019-01-04 NOTE — Progress Notes (Signed)
Patient: Tyler Mcclure, Male    DOB: March 03, 1961, 58 y.o.   MRN: [id number] Visit Date: 01/04/2019  Today's Provider: [name], MD   Chief Complaint  Patient presents with  . Annual Exam   Subjective:     Annual physical exam Tyler Mcclure is a 58 y.o. male who presents today for health maintenance and complete physical. He feels well. He reports exercising 5 days weekly. He reports he is sleeping fairly well.   Colonoscopy- 02/24/2012. Normal. Repeat in 10 years.   Review of Systems  Constitutional: Negative.   HENT: Negative.   Eyes: Negative.   Respiratory: Negative.   Cardiovascular: Negative.   Gastrointestinal: Negative.   Endocrine: Negative.   Genitourinary: Negative.   Musculoskeletal: Negative.   Skin: Negative.   Allergic/Immunologic: Negative.   Neurological: Negative.   Hematological: Negative.   Psychiatric/Behavioral: Negative.     Social History      He  reports that he has never smoked. He quit smokeless tobacco use about 4 years ago. He reports that he does not drink alcohol or use drugs.       Social History   Socioeconomic History  . Marital status: Married    Spouse name: Not on file  . Number of children: Not on file  . Years of education: Not on file  . Highest education level: Not on file  Occupational History  . Not on file  Social Needs  . Financial resource strain: Not on file  . Food insecurity    Worry: Not on file    Inability: Not on file  . Transportation needs    Medical: Not on file    Non-medical: Not on file  Tobacco Use  . Smoking status: Never Smoker  . Smokeless tobacco: Former Network engineer and Sexual Activity  . Alcohol use: No    Alcohol/week: 0.0 standard drinks    Comment: OCCASIONALLY  . Drug use: No  . Sexual activity: Yes    Partners: Female    Comment: Married  Lifestyle  . Physical activity    Days per week: Not on file    Minutes per session: Not on file  . Stress: Not on file   Relationships  . Social Herbalist on phone: Not on file    Gets together: Not on file    Attends religious service: Not on file    Active member of club or organization: Not on file    Attends meetings of clubs or organizations: Not on file    Relationship status: Not on file  Other Topics Concern  . Not on file  Social History Narrative  . Not on file    Past Medical History:  Diagnosis Date  . Arthritis   . Pulmonary embolus [facility])      Patient Active Problem List   Diagnosis Date Noted  . Herniated lumbar intervertebral disc 12/26/2016  . Plantar fasciitis 12/26/2016  . Left pulmonary embolus ([place]) 10/30/2015  . Pulmonary embolus ([place]) 05/24/2015  . Arthritis 05/24/2015  . Rupture of tendon of biceps, long head 11/25/2014  . Squamous cell carcinoma of skin 11/25/2014    Past Surgical History:  Procedure Laterality Date  . BICEPS TENDON REPAIR    . RESECTION DISTAL CLAVICAL Left 04/27/2015   Procedure: DISTAL CLAVICLE EXCISION;  Surgeon: [name], MD;  Location: [place];  Service: Orthopedics;  Laterality: Left;  . SHOULDER ARTHROSCOPY WITH  ROTATOR CUFF REPAIR AND SUBACROMIAL DECOMPRESSION Left 04/27/2015   Procedure: LEFT SHOULDER ARTHROSCOPY WITH DEBRIDEMENT, ACROMIOPLASTY, ROTATOR CUFF REPAIR;  Surgeon: [name], MD;  Location: [place];  Service: Orthopedics;  Laterality: Left;  . SHOULDER SURGERY      Family History        Family Status  Relation Name Status  . Mother  Deceased at age 48  . Father  Deceased at age 53  . Sister 1 Alive  . Daughter  Alive  . Son  Alive  . Sister 2 Deceased        His family history includes Breast cancer in his mother; Healthy in his daughter, sister, and son; Hyperlipidemia in his mother; Pancreatic cancer in his father.      Allergies  Allergen Reactions  . Mercury   . Penicillins      Current Outpatient Medications:  .  aspirin EC 81 MG tablet, Take 81 mg  by mouth daily., Disp: , Rfl:  .  Cholecalciferol (VITAMIN D) 2000 UNITS CAPS, Take 1 capsule by mouth daily., Disp: , Rfl:  .  MULTIPLE VITAMIN PO, Take 1 tablet by mouth daily., Disp: , Rfl:  .  Omega-3 Fatty Acids (FISH OIL BURP-LESS) 1200 MG CAPS, Take 2 capsules by mouth daily., Disp: , Rfl:  .  vitamin E 400 UNIT capsule, Take 1 capsule by mouth daily., Disp: , Rfl:  .  desonide (DESOWEN) 0.05 % cream, apply to affected area twice a day if needed, Disp: , Rfl: 0 .  hydrocortisone (ANUSOL-HC) 2.5 % rectal cream, Place 1 application rectally 2 (two) times daily. (Patient not taking: Reported on 01/04/2019), Disp: 30 g, Rfl: 12   Patient Care Team: Jerrol Banana., MD as PCP - General (Family Medicine)    Objective:    Vitals: BP 126/64   Pulse 72   Temp 98.6 F (37 C)   Resp 16   Ht 6' (1.829 m)   Wt 211 lb (95.7 kg)   SpO2 98%   BMI 28.62 kg/m    Vitals:   01/04/19 0911  BP: 126/64  Pulse: 72  Resp: 16  Temp: 98.6 F (37 C)  SpO2: 98%  Weight: 211 lb (95.7 kg)  Height: 6' (1.829 m)     Physical Exam Vitals signs reviewed.  Constitutional:      Appearance: He is well-developed.  HENT:     Head: Normocephalic and atraumatic.     Right Ear: External ear normal.     Nose: Nose normal.  Eyes:     Conjunctiva/sclera: Conjunctivae normal.     Pupils: Pupils are equal, round, and reactive to light.  Neck:     Musculoskeletal: Normal range of motion and neck supple.  Cardiovascular:     Rate and Rhythm: Normal rate and regular rhythm.     Heart sounds: Normal heart sounds.  Pulmonary:     Effort: Pulmonary effort is normal.     Breath sounds: Normal breath sounds.  Abdominal:     General: Bowel sounds are normal.     Palpations: Abdomen is soft.  Genitourinary:    Penis: Normal.      Prostate: Normal.     Rectum: Normal.  Musculoskeletal: Normal range of motion.  Skin:    General: Skin is warm and dry.  Neurological:     General: No focal deficit  present.     Mental Status: He is alert and oriented to person, place, and time.  Psychiatric:        Mood and Affect: Mood normal.        Behavior: Behavior normal.        Thought Content: Thought content normal.        Judgment: Judgment normal.      Depression Screen PHQ 2/9 Scores 01/04/2019 12/30/2017 12/26/2016 12/26/2015  PHQ - 2 Score 0 0 0 0  PHQ- 9 Score - - 3 -       Assessment & Plan:     Routine Health Maintenance and Physical Exam  Exercise Activities and Dietary recommendations Goals   None     Immunization History  Administered Date(s) Administered  . Tdap 11/24/2008    Health Maintenance  Topic Date Due  . HIV Screening  04/08/1976  . INFLUENZA VACCINE  10/24/2018  . TETANUS/TDAP  11/25/2018  . COLONOSCOPY  02/23/2022  . Hepatitis C Screening  Completed     Discussed health benefits of physical activity, and encouraged him to engage in regular exercise appropriate for his age and condition.    1. Annual physical exam  - CBC with Differential/Platelet - Comprehensive metabolic panel - Lipid panel - TSH  2. Prostate cancer screening  - PSA  3. Need for Td vaccine  - Td : Tetanus/diphtheria >7yo Preservative  free  4. Colon cancer screening Looks like colonoscopy was by Dr. Candace Cruise in February 24, 2012 - IFOBT POC (occult bld, rslt in office); Future - IFOBT POC (occult bld, rslt in office)  5. NASH (nonalcoholic steatohepatitis) Patient working on diet and exercise  6. Psoriasis    Anntionette Madkins Cranford Mon, MD  [facility]

## 2019-01-05 LAB — CBC WITH DIFFERENTIAL/PLATELET
Basophils Absolute: 0.1 10*3/uL (ref 0.0–0.2)
Basos: 2 %
EOS (ABSOLUTE): 0.1 10*3/uL (ref 0.0–0.4)
Eos: 2 %
Hematocrit: 49.9 % (ref 37.5–51.0)
Hemoglobin: 17.2 g/dL (ref 13.0–17.7)
Immature Grans (Abs): 0 10*3/uL (ref 0.0–0.1)
Immature Granulocytes: 0 %
Lymphocytes Absolute: 1.4 10*3/uL (ref 0.7–3.1)
Lymphs: 32 %
MCH: 30.8 pg (ref 26.6–33.0)
MCHC: 34.5 g/dL (ref 31.5–35.7)
MCV: 89 fL (ref 79–97)
Monocytes Absolute: 0.5 10*3/uL (ref 0.1–0.9)
Monocytes: 12 %
Neutrophils Absolute: 2.2 10*3/uL (ref 1.4–7.0)
Neutrophils: 52 %
Platelets: 183 10*3/uL (ref 150–450)
RBC: 5.58 x10E6/uL (ref 4.14–5.80)
RDW: 12 % (ref 11.6–15.4)
WBC: 4.3 10*3/uL (ref 3.4–10.8)

## 2019-01-05 LAB — TSH: TSH: 3.64 u[IU]/mL (ref 0.450–4.500)

## 2019-01-05 LAB — COMPREHENSIVE METABOLIC PANEL
ALT: 43 IU/L (ref 0–44)
AST: 29 IU/L (ref 0–40)
Albumin/Globulin Ratio: 1.6 (ref 1.2–2.2)
Albumin: 4.8 g/dL (ref 3.8–4.9)
Alkaline Phosphatase: 52 IU/L (ref 39–117)
BUN/Creatinine Ratio: 16 (ref 9–20)
BUN: 15 mg/dL (ref 6–24)
Bilirubin Total: 1.9 mg/dL — ABNORMAL HIGH (ref 0.0–1.2)
CO2: 24 mmol/L (ref 20–29)
Calcium: 9.8 mg/dL (ref 8.7–10.2)
Chloride: 103 mmol/L (ref 96–106)
Creatinine, Ser: 0.96 mg/dL (ref 0.76–1.27)
GFR calc Af Amer: 101 mL/min/{1.73_m2} (ref >59)
GFR calc non Af Amer: 87 mL/min/{1.73_m2} (ref >59)
Globulin, Total: 3 g/dL (ref 1.5–4.5)
Glucose: 101 mg/dL — ABNORMAL HIGH (ref 65–99)
Potassium: 4.3 mmol/L (ref 3.5–5.2)
Sodium: 142 mmol/L (ref 134–144)
Total Protein: 7.8 g/dL (ref 6.0–8.5)

## 2019-01-05 LAB — LIPID PANEL
Chol/HDL Ratio: 3.5 ratio (ref 0.0–5.0)
Cholesterol, Total: 181 mg/dL (ref 100–199)
HDL: 52 mg/dL (ref >39)
LDL Chol Calc (NIH): 103 mg/dL — ABNORMAL HIGH (ref 0–99)
Triglycerides: 147 mg/dL (ref 0–149)
VLDL Cholesterol Cal: 26 mg/dL (ref 5–40)

## 2019-01-05 LAB — PSA: Prostate Specific Ag, Serum: 0.4 ng/mL (ref 0.0–4.0)

## 2019-01-07 ENCOUNTER — Telehealth: Payer: Self-pay

## 2019-01-07 DIAGNOSIS — Z0001 Encounter for general adult medical examination with abnormal findings: Secondary | ICD-10-CM | POA: Diagnosis not present

## 2019-01-07 DIAGNOSIS — E6609 Other obesity due to excess calories: Secondary | ICD-10-CM | POA: Diagnosis not present

## 2019-01-07 DIAGNOSIS — Z6832 Body mass index (BMI) 32.0-32.9, adult: Secondary | ICD-10-CM | POA: Diagnosis not present

## 2019-01-07 DIAGNOSIS — R7301 Impaired fasting glucose: Secondary | ICD-10-CM | POA: Diagnosis not present

## 2019-01-07 DIAGNOSIS — K219 Gastro-esophageal reflux disease without esophagitis: Secondary | ICD-10-CM | POA: Diagnosis not present

## 2019-01-07 DIAGNOSIS — Z0000 Encounter for general adult medical examination without abnormal findings: Secondary | ICD-10-CM | POA: Diagnosis not present

## 2019-01-07 DIAGNOSIS — Z79899 Other long term (current) drug therapy: Secondary | ICD-10-CM | POA: Diagnosis not present

## 2019-01-07 DIAGNOSIS — D649 Anemia, unspecified: Secondary | ICD-10-CM | POA: Diagnosis not present

## 2019-01-07 DIAGNOSIS — E782 Mixed hyperlipidemia: Secondary | ICD-10-CM | POA: Diagnosis not present

## 2019-01-07 DIAGNOSIS — I10 Essential (primary) hypertension: Secondary | ICD-10-CM | POA: Diagnosis not present

## 2019-01-07 DIAGNOSIS — E039 Hypothyroidism, unspecified: Secondary | ICD-10-CM | POA: Diagnosis not present

## 2019-01-07 NOTE — Telephone Encounter (Signed)
-----   Message from Jerrol Banana., MD sent at 01/07/2019  9:13 AM EDT ----- Labs okay.

## 2019-01-07 NOTE — Telephone Encounter (Signed)
Patient notified of lab results

## 2019-01-11 DIAGNOSIS — Z0000 Encounter for general adult medical examination without abnormal findings: Secondary | ICD-10-CM | POA: Diagnosis not present

## 2019-01-11 DIAGNOSIS — E039 Hypothyroidism, unspecified: Secondary | ICD-10-CM | POA: Diagnosis not present

## 2019-01-11 DIAGNOSIS — I10 Essential (primary) hypertension: Secondary | ICD-10-CM | POA: Diagnosis not present

## 2019-01-11 DIAGNOSIS — K219 Gastro-esophageal reflux disease without esophagitis: Secondary | ICD-10-CM | POA: Diagnosis not present

## 2019-01-11 DIAGNOSIS — E782 Mixed hyperlipidemia: Secondary | ICD-10-CM | POA: Diagnosis not present

## 2019-01-15 DIAGNOSIS — H00011 Hordeolum externum right upper eyelid: Secondary | ICD-10-CM | POA: Diagnosis not present

## 2019-01-21 IMAGING — CT CT ABDOMEN W/ CM
1 of 3 series · 13 of 32 positions shown, 19 images · IV contrast (APPLIED)
Comparison: MRI abdomen dated 10/27/2003. CT abdomen dated
08/17/2003.

CLINICAL DATA: Elevated LFTs

EXAM:
CT ABDOMEN WITH CONTRAST
TECHNIQUE: Multidetector CT imaging of the abdomen was performed using the
standard protocol following bolus administration of intravenous
contrast.
CONTRAST:  100mL CKA2WF-RJJ IOPAMIDOL (CKA2WF-RJJ) INJECTION 61%

[Series 2: axial st · axial · 0.81mm/px · z∈[-923,-683]mm · 13 of 57 slices shown, 19 images]
[im 5/57  soft-tissue]
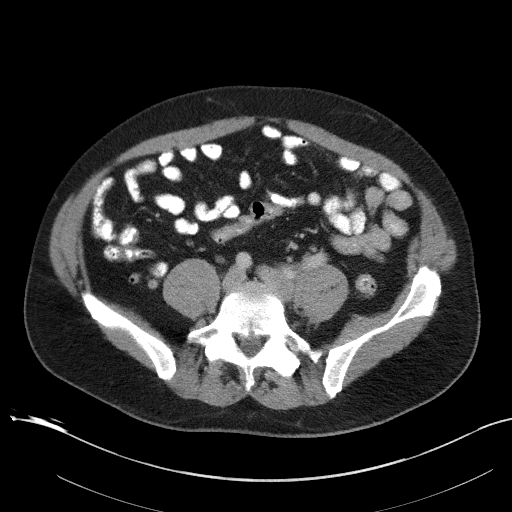
[im 5/57  bone]
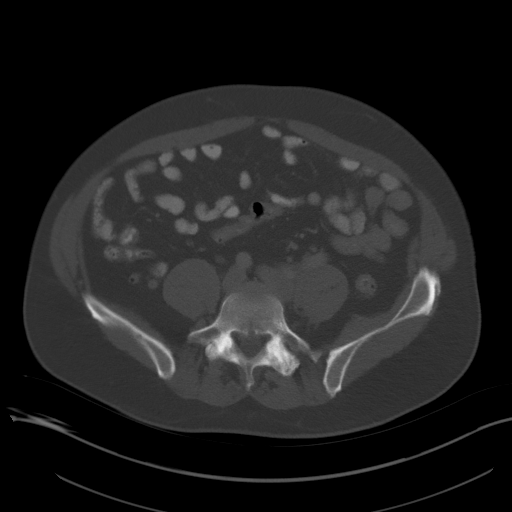
[im 9/57  soft-tissue]
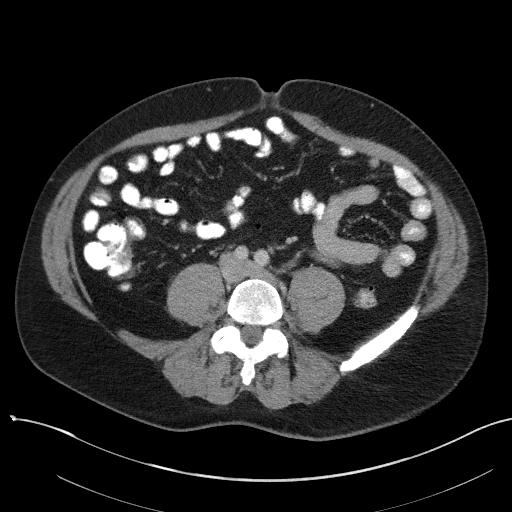
[im 13/57  soft-tissue]
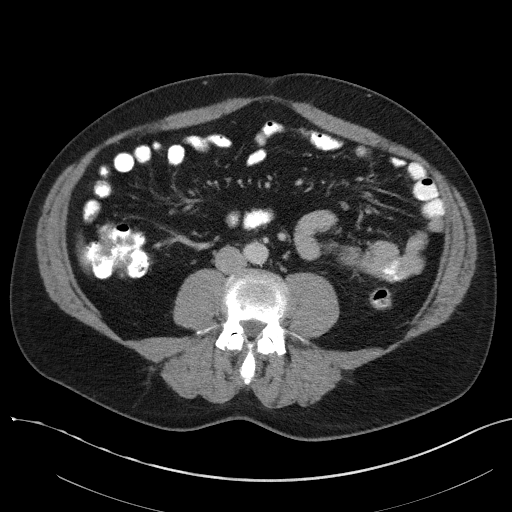
[im 17/57  soft-tissue]
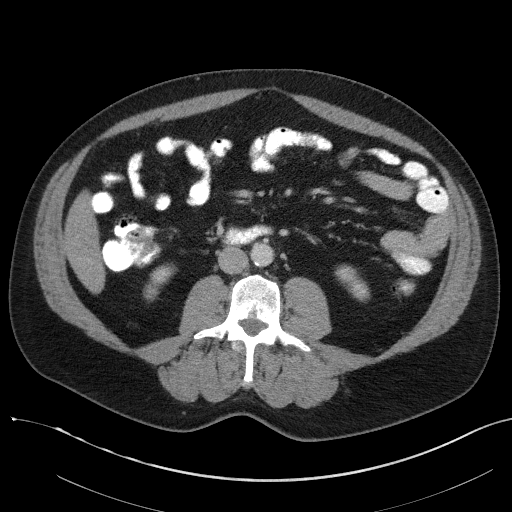
[im 21/57  soft-tissue]
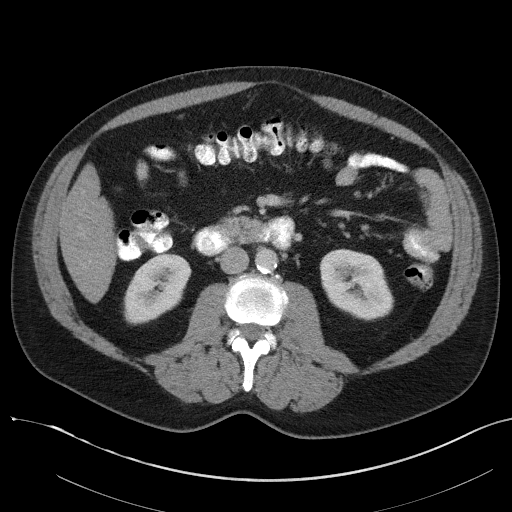
[im 25/57  soft-tissue]
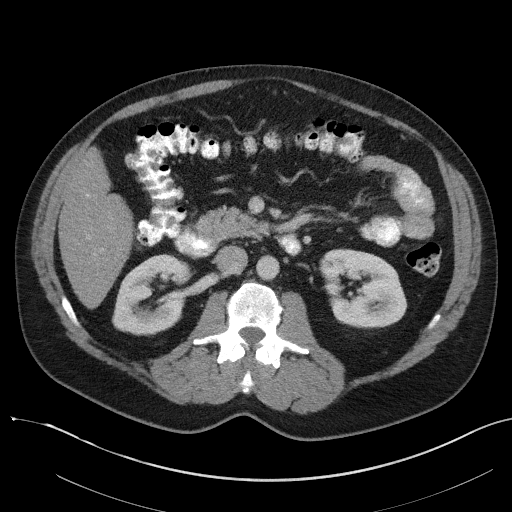
[im 29/57  soft-tissue]
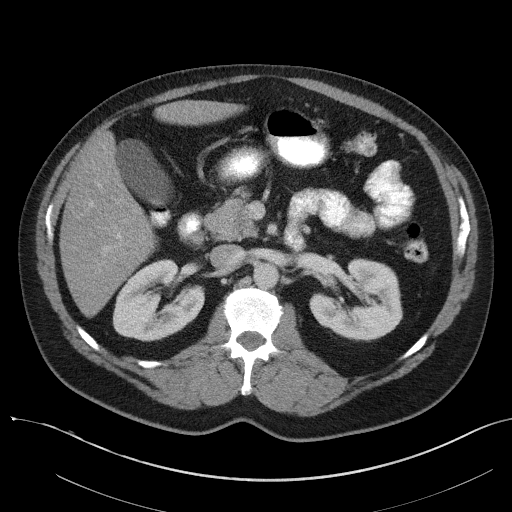
[im 33/57  soft-tissue]
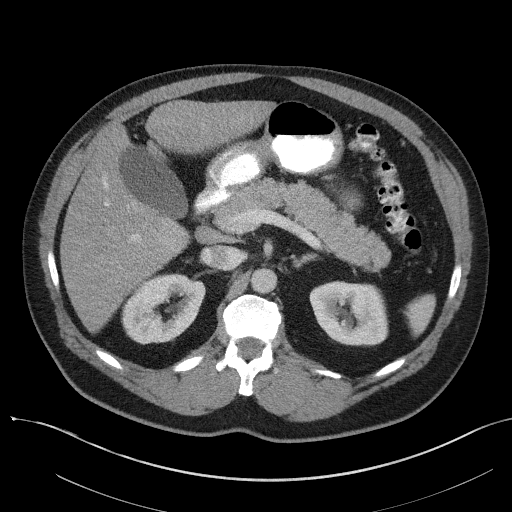
[im 37/57  soft-tissue]
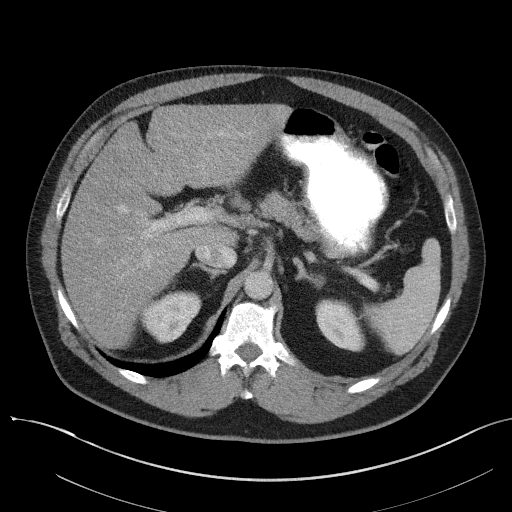
[im 37/57  bone]
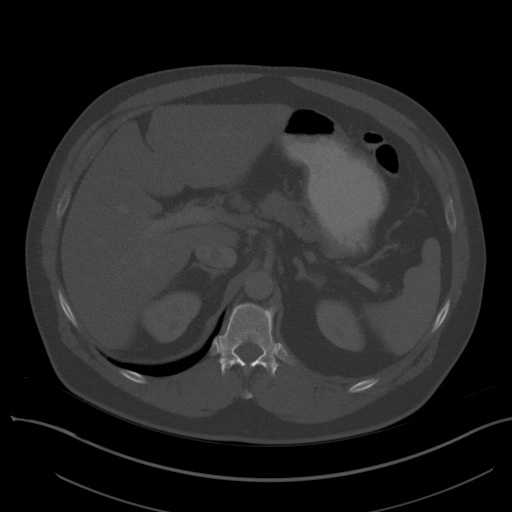
[im 41/57  soft-tissue]
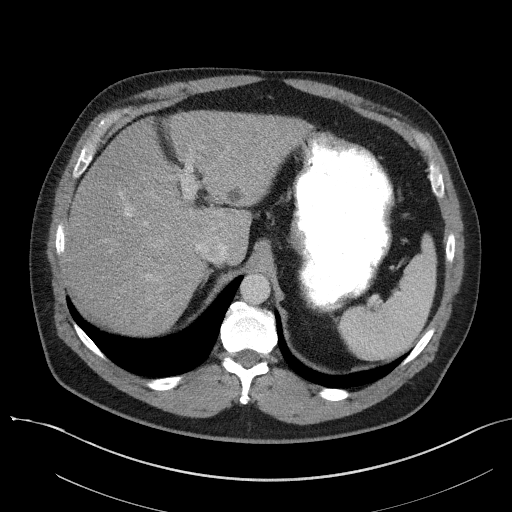
[im 41/57  lung]
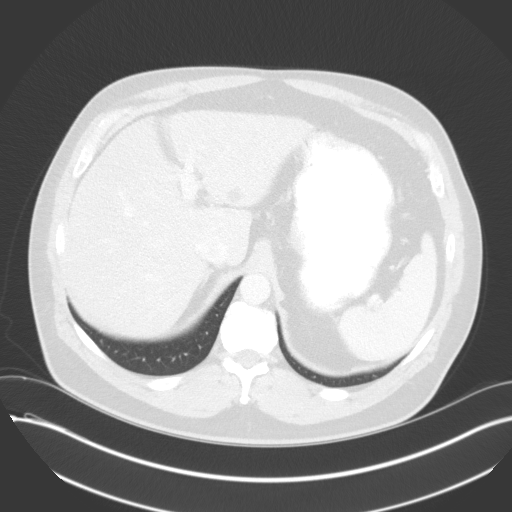
[im 45/57  soft-tissue]
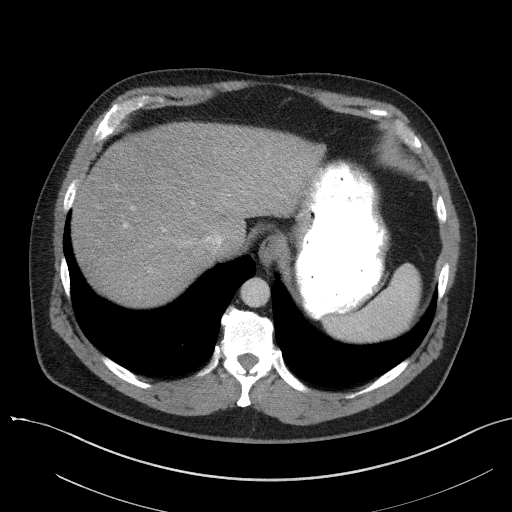
[im 45/57  lung]
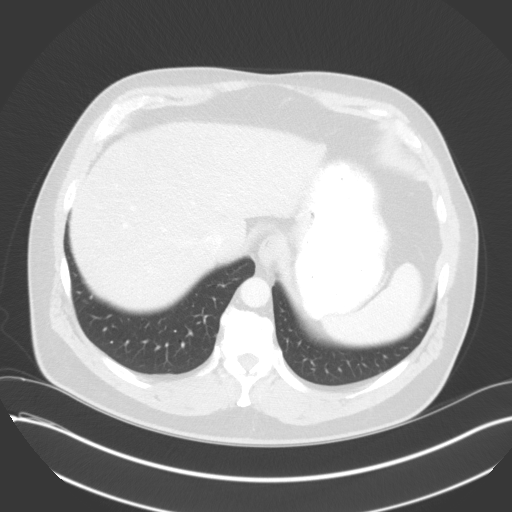
[im 49/57  soft-tissue]
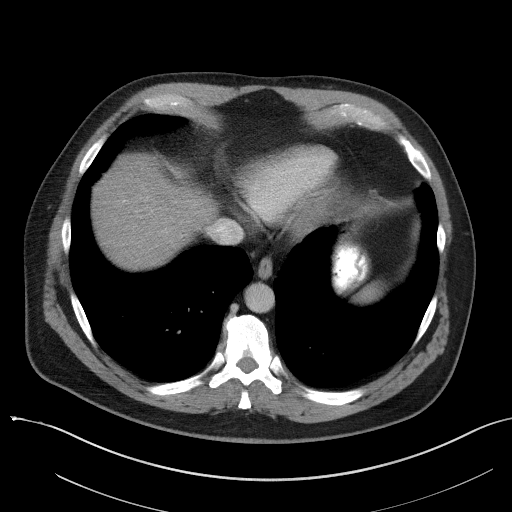
[im 49/57  lung]
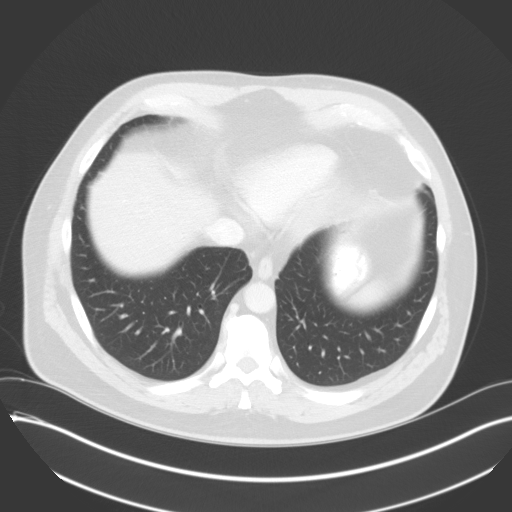
[im 53/57  soft-tissue]
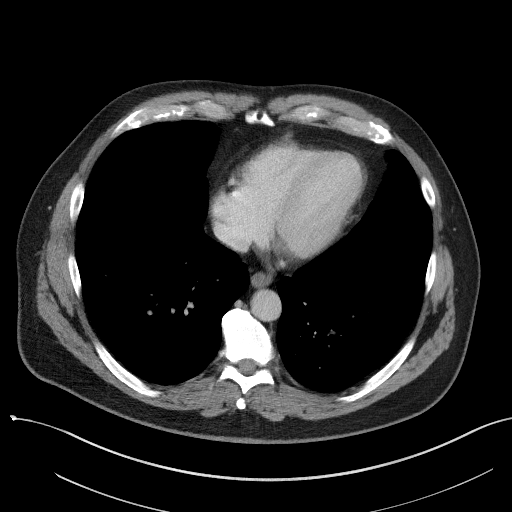
[im 53/57  lung]
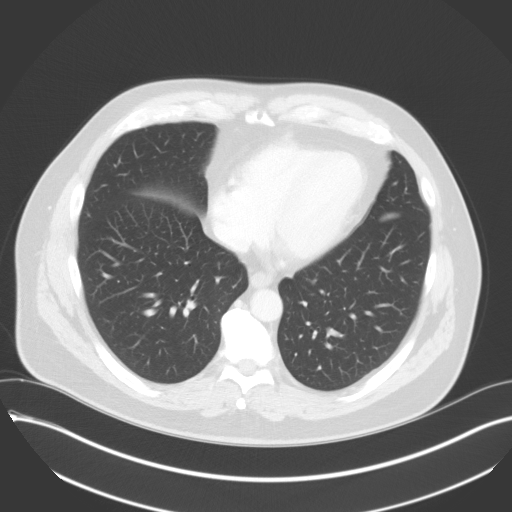

[13 of 32 positions shown; findings below may reference images not displayed]

FINDINGS: Lower chest: 4 mm subpleural nodule in the left lower lobe (series
4/ image 3).

Hepatobiliary: 14 mm cyst posteriorly in segment 2 (series 2/ image
18).

Gallbladder is unremarkable. No intrahepatic or extrahepatic ductal
dilatation.

Pancreas: Within normal limits.

Spleen: Within normal limits.

Adrenals/Urinary Tract: Adrenal glands within normal limits.

Kidneys are within normal limits.  No hydronephrosis.

Stomach/Bowel: Stomach is within normal limits.

Visualized bowel is unremarkable, noting a normal appendix (series
2/image 52).

Vascular/Lymphatic: No evidence of abdominal aortic aneurysm.

Atherosclerotic calcifications of the abdominal aorta and branch
vessels.

No suspicious abdominal lymphadenopathy.

Other: No abdominal ascites.

Musculoskeletal: Mild degenerative changes of the visualized
thoracolumbar spine.
IMPRESSION: 14 mm hepatic cyst in segment 2, benign.

4 mm subpleural nodule in the left lower lobe. No follow-up needed
if patient is low-risk. Non-contrast chest CT can be considered in
12 months if patient is high-risk. This recommendation follows the
consensus statement: Guidelines for Management of Incidental
Pulmonary Nodules Detected on CT Images: From the [HOSPITAL]

## 2019-04-08 ENCOUNTER — Ambulatory Visit: Payer: BC Managed Care – PPO | Admitting: Physician Assistant

## 2019-04-08 ENCOUNTER — Encounter: Payer: Self-pay | Admitting: Physician Assistant

## 2019-04-08 ENCOUNTER — Other Ambulatory Visit: Payer: Self-pay

## 2019-04-08 VITALS — BP 128/88 | Temp 96.9°F | Wt 211.2 lb

## 2019-04-08 DIAGNOSIS — T148XXA Other injury of unspecified body region, initial encounter: Secondary | ICD-10-CM | POA: Diagnosis not present

## 2019-04-08 DIAGNOSIS — M545 Low back pain, unspecified: Secondary | ICD-10-CM

## 2019-04-08 DIAGNOSIS — M5126 Other intervertebral disc displacement, lumbar region: Secondary | ICD-10-CM | POA: Diagnosis not present

## 2019-04-08 MED ORDER — KETOROLAC TROMETHAMINE 60 MG/2ML IM SOLN
60.0000 mg | Freq: Once | INTRAMUSCULAR | Status: AC
  Administered 2019-04-08: 60 mg via INTRAMUSCULAR

## 2019-04-08 MED ORDER — KETOROLAC TROMETHAMINE 30 MG/ML IJ SOLN: 30.0000 mg | Freq: Once | INTRAMUSCULAR | Status: DC

## 2019-04-08 MED ORDER — METHOCARBAMOL 500 MG PO TABS: 500.0000 mg | ORAL_TABLET | Freq: Three times a day (TID) | ORAL | 0 refills | Status: AC | PRN

## 2019-04-08 NOTE — Progress Notes (Signed)
Patient: Tyler Mcclure Male    DOB: December 11, 1960   59 y.o.   MRN: [id number] Visit Date: 04/08/2019  Today's Provider: Trinna Post, PA-C   Chief Complaint  Patient presents with  . Back Pain   Subjective:    I, [name],CMA am acting as a Education administrator for [employer].   Back Pain This is a recurrent problem. The current episode started in the past 7 days. The problem occurs constantly. The problem is unchanged. The quality of the pain is described as aching. The pain does not radiate. The pain is moderate. The pain is the same all the time. Pertinent negatives include no leg pain, numbness or pelvic pain. He has tried heat and NSAIDs for the symptoms. The treatment provided moderate relief.   He has a history of lumbar herniated disc with prior fusion. No constipation, diarrhea, no dysuria, no urinary frequency. No injuries. He woke up Saturday, moved his back wrong and felt subsequent pain afterwards. He reports the pain worsens when he moves his leg.  Allergies  Allergen Reactions  . Mercury   . Penicillins      Current Outpatient Medications:  .  aspirin EC 81 MG tablet, Take 81 mg by mouth daily., Disp: , Rfl:  .  Cholecalciferol (VITAMIN D) 2000 UNITS CAPS, Take 1 capsule by mouth daily., Disp: , Rfl:  .  desonide (DESOWEN) 0.05 % cream, apply to affected area twice a day if needed, Disp: , Rfl: 0 .  MULTIPLE VITAMIN PO, Take 1 tablet by mouth daily., Disp: , Rfl:  .  Omega-3 Fatty Acids (FISH OIL BURP-LESS) 1200 MG CAPS, Take 2 capsules by mouth daily., Disp: , Rfl:  .  vitamin E 400 UNIT capsule, Take 1 capsule by mouth daily., Disp: , Rfl:  .  hydrocortisone (ANUSOL-HC) 2.5 % rectal cream, Place 1 application rectally 2 (two) times daily. (Patient not taking: Reported on 01/04/2019), Disp: 30 g, Rfl: 12  Review of Systems  Constitutional: Negative.   Respiratory: Negative.   Cardiovascular: Negative.   Genitourinary: Negative for decreased urine  volume, frequency, pelvic pain, penile pain, scrotal swelling and testicular pain.  Musculoskeletal: Positive for back pain.  Neurological: Negative for numbness.    Social History   Tobacco Use  . Smoking status: Never Smoker  . Smokeless tobacco: Former Network engineer Use Topics  . Alcohol use: No    Alcohol/week: 0.0 standard drinks    Comment: OCCASIONALLY      Objective:   BP 128/88 (BP Location: Left Arm, Patient Position: Sitting, Cuff Size: Normal)   Temp (!) 96.9 F (36.1 C) (Temporal)   Wt 211 lb 3.2 oz (95.8 kg)   BMI 28.64 kg/m  Vitals:   04/08/19 1036  BP: 128/88  Temp: (!) 96.9 F (36.1 C)  TempSrc: Temporal  Weight: 211 lb 3.2 oz (95.8 kg)  Body mass index is 28.64 kg/m.   Physical Exam Constitutional:      Appearance: Normal appearance.  Cardiovascular:     Rate and Rhythm: Normal rate and regular rhythm.     Heart sounds: Normal heart sounds.  Pulmonary:     Effort: Pulmonary effort is normal.     Breath sounds: Normal breath sounds.  Abdominal:     General: Bowel sounds are normal.     Palpations: Abdomen is soft.     Tenderness: There is no right CVA tenderness or left CVA tenderness.  Musculoskeletal:  Back:  Skin:    General: Skin is warm and dry.  Neurological:     Mental Status: He is alert and oriented to person, place, and time. Mental status is at baseline.     Deep Tendon Reflexes:     Reflex Scores:      Patellar reflexes are 2+ on the right side and 1+ on the left side.    Comments: Strength 5/5 bilateral lower extremities  Psychiatric:        Mood and Affect: Mood normal.        Behavior: Behavior normal.      No results found for any visits on 04/08/19.     Assessment & Plan    1. Herniated lumbar intervertebral disc  30 mg of toradol today and muscle relaxer as below for muscular strain.   - ketorolac (TORADOL) injection 60 mg  2. Acute left-sided low back pain without sciatica  - methocarbamol  (ROBAXIN) 500 MG tablet; Take 1 tablet (500 mg total) by mouth every 8 (eight) hours as needed for up to 7 days for muscle spasms.  Dispense: 21 tablet; Refill: 0 - ketorolac (TORADOL) injection 60 mg  3. Muscle strain  - methocarbamol (ROBAXIN) 500 MG tablet; Take 1 tablet (500 mg total) by mouth every 8 (eight) hours as needed for up to 7 days for muscle spasms.  Dispense: 21 tablet; Refill: 0 - ketorolac (TORADOL) injection 60 mg  The entirety of the information documented in the History of Present Illness, Review of Systems and Physical Exam were personally obtained by me. Portions of this information were initially documented by [facility] and reviewed by me for thoroughness and accuracy.   F/u PRN    Trinna Post, PA-C  [facility]

## 2019-04-08 NOTE — Patient Instructions (Signed)
Muscle Strain A muscle strain is an injury that happens when a muscle is stretched longer than normal. This can happen during a fall, sports, or lifting. This can tear some muscle fibers. Usually, recovery from muscle strain takes 1-2 weeks. Complete healing normally takes 5-6 weeks. This condition is first treated with PRICE therapy. This involves:  Protecting your muscle from being injured again.  Resting your injured muscle.  Icing your injured muscle.  Applying pressure (compression) to your injured muscle. This may be done with a splint or elastic bandage.  Raising (elevating) your injured muscle. Your doctor may also recommend medicine for pain. Follow these instructions at home: If you have a splint:  Wear the splint as told by your doctor. Take it off only as told by your doctor.  Loosen the splint if your fingers or toes tingle, get numb, or turn cold and blue.  Keep the splint clean.  If the splint is not waterproof: ? Do not let it get wet. ? Cover it with a watertight covering when you take a bath or a shower. Managing pain, stiffness, and swelling   If directed, put ice on your injured area. ? If you have a removable splint, take it off as told by your doctor. ? Put ice in a plastic bag. ? Place a towel between your skin and the bag. ? Leave the ice on for 20 minutes, 2-3 times a day.  Move your fingers or toes often. This helps to avoid stiffness and lessen swelling.  Raise your injured area above the level of your heart while you are sitting or lying down.  Wear an elastic bandage as told by your doctor. Make sure it is not too tight. General instructions  Take over-the-counter and prescription medicines only as told by your doctor.  Limit your activity. Rest your injured muscle as told by your doctor. Your doctor may say that gentle movements are okay.  If physical therapy was prescribed, do exercises as told by your doctor.  Do not put pressure on any  part of the splint until it is fully hardened. This may take many hours.  Do not use any products that contain nicotine or tobacco, such as cigarettes and e-cigarettes. These can delay bone healing. If you need help quitting, ask your doctor.  Warm up before you exercise. This helps to prevent more muscle strains.  Ask your doctor when it is safe to drive if you have a splint.  Keep all follow-up visits as told by your doctor. This is important. Contact a doctor if:  You have more pain or swelling in your injured area. Get help right away if:  You have any of these problems in your injured area: ? You have numbness. ? You have tingling. ? You lose a lot of strength. Summary  A muscle strain is an injury that happens when a muscle is stretched longer than normal.  This condition is first treated with PRICE therapy. This includes protecting, resting, icing, adding pressure, and raising your injury.  Limit your activity. Rest your injured muscle as told by your doctor. Your doctor may say that gentle movements are okay.  Warm up before you exercise. This helps to prevent more muscle strains. This information is not intended to replace advice given to you by your health care provider. Make sure you discuss any questions you have with your health care provider. Document Revised: 05/07/2018 Document Reviewed: 04/17/2016 Elsevier Patient Education  [place].

## 2019-07-15 DIAGNOSIS — R7301 Impaired fasting glucose: Secondary | ICD-10-CM | POA: Diagnosis not present

## 2019-07-15 DIAGNOSIS — E782 Mixed hyperlipidemia: Secondary | ICD-10-CM | POA: Diagnosis not present

## 2019-07-16 DIAGNOSIS — I10 Essential (primary) hypertension: Secondary | ICD-10-CM | POA: Diagnosis not present

## 2019-07-16 DIAGNOSIS — E782 Mixed hyperlipidemia: Secondary | ICD-10-CM | POA: Diagnosis not present

## 2019-07-16 DIAGNOSIS — R7301 Impaired fasting glucose: Secondary | ICD-10-CM | POA: Diagnosis not present

## 2019-07-16 DIAGNOSIS — E039 Hypothyroidism, unspecified: Secondary | ICD-10-CM | POA: Diagnosis not present

## 2019-08-25 NOTE — Progress Notes (Signed)
  I,Atiya M Cummings,acting as a scribe for Richard Gilbert Jr, MD.,have documented all relevant documentation on the behalf of Richard Gilbert Jr, MD,as directed by  Richard Gilbert Jr, MD while in the presence of Richard Gilbert Jr, MD.  Established patient visit   Patient: Tyler Mcclure   DOB: 10/13/1960   59 y.o. Male  MRN: [id number] Visit Date: 08/26/2019  Today's healthcare provider: Richard Gilbert Jr, MD   Chief Complaint  Patient presents with  . Arthritis   Subjective    HPI  Patient presents today for an office visit because he has sciatica and he would like to know if he should see someone due to arthritis pain.  Patient has psoriasis and is worried about having psoriatic arthritis. He is hurting in both elbows and his feet and his toes. No swelling. No other symptoms. Psoriasis followed by Dr. Isenstein      Medications: Outpatient Medications Prior to Visit  Medication Sig  . aspirin EC 81 MG tablet Take 81 mg by mouth daily.  . Cholecalciferol (VITAMIN D) 2000 UNITS CAPS Take 1 capsule by mouth daily.  . desonide (DESOWEN) 0.05 % cream apply to affected area twice a day if needed  . MULTIPLE VITAMIN PO Take 1 tablet by mouth daily.  . Omega-3 Fatty Acids (FISH OIL BURP-LESS) 1200 MG CAPS Take 2 capsules by mouth daily.  . vitamin E 400 UNIT capsule Take 1 capsule by mouth daily.  . hydrocortisone (ANUSOL-HC) 2.5 % rectal cream Place 1 application rectally 2 (two) times daily. (Patient not taking: Reported on 01/04/2019)   No facility-administered medications prior to visit.    Review of Systems  Constitutional: Negative for appetite change, chills and fever.  Eyes: Negative.   Respiratory: Negative for chest tightness, shortness of breath and wheezing.   Cardiovascular: Negative for chest pain and palpitations.  Gastrointestinal: Negative for abdominal pain, nausea and vomiting.  Endocrine: Negative.   Musculoskeletal: Positive for arthralgias. Negative  for joint swelling.  Allergic/Immunologic: Negative.   Neurological: Negative.   Hematological: Negative.   Psychiatric/Behavioral: Negative.        Objective    BP 126/84 (BP Location: Left Arm, Patient Position: Sitting, Cuff Size: Large)   Pulse 76   Temp (!) 97.5 F (36.4 C) (Temporal)   Ht 6' (1.829 m)   Wt 223 lb 6.4 oz (101.3 kg)   BMI 30.30 kg/m     Physical Exam Vitals reviewed.  Constitutional:      Appearance: He is well-developed.  HENT:     Head: Normocephalic and atraumatic.     Right Ear: External ear normal.     Nose: Nose normal.  Eyes:     Conjunctiva/sclera: Conjunctivae normal.     Pupils: Pupils are equal, round, and reactive to light.  Cardiovascular:     Rate and Rhythm: Normal rate and regular rhythm.     Heart sounds: Normal heart sounds.  Pulmonary:     Effort: Pulmonary effort is normal.     Breath sounds: Normal breath sounds.  Abdominal:     General: Bowel sounds are normal.     Palpations: Abdomen is soft.  Musculoskeletal:     Cervical back: Normal range of motion and neck supple.  Skin:    General: Skin is warm and dry.  Neurological:     General: No focal deficit present.     Mental Status: He is alert and oriented to person, place, and time.  Psychiatric:          Mood and Affect: Mood normal.        Behavior: Behavior normal.        Thought Content: Thought content normal.        Judgment: Judgment normal.       No results found for any visits on 08/26/19.  Assessment & Plan     1. Polyarthralgia Start work-up for possible psoriatic arthritis.  Refer to rheumatology. - Ambulatory referral to Rheumatology - Sed Rate (ESR) - Rheumatoid Factor - ANA Direct w/Reflex if Positive - ketorolac (TORADOL) injection 60 mg  2. Herniated lumbar intervertebral disc Doing well after surgery.  3. Psoriasis Per dermatology.  4. Left pulmonary embolus (HCC) Provoked, resolved.  5. Elevated ferritin, hemoglobin, and red blood  cell count (HCC)    No follow-ups on file.      I, [name], MD, have reviewed all documentation for this visit. The documentation on 08/29/19 for the exam, diagnosis, procedures, and orders are all accurate and complete.    Zephyr Sausedo Cranford Mon, MD  [facility] [phone] (phone) [phone] (fax)  [facility]

## 2019-08-26 ENCOUNTER — Other Ambulatory Visit: Payer: Self-pay

## 2019-08-26 ENCOUNTER — Ambulatory Visit (INDEPENDENT_AMBULATORY_CARE_PROVIDER_SITE_OTHER): Payer: BC Managed Care – PPO | Admitting: Family Medicine

## 2019-08-26 ENCOUNTER — Encounter: Payer: Self-pay | Admitting: Family Medicine

## 2019-08-26 VITALS — BP 126/84 | HR 76 | Temp 97.5°F | Ht 72.0 in | Wt 223.4 lb

## 2019-08-26 DIAGNOSIS — L409 Psoriasis, unspecified: Secondary | ICD-10-CM | POA: Diagnosis not present

## 2019-08-26 DIAGNOSIS — I2699 Other pulmonary embolism without acute cor pulmonale: Secondary | ICD-10-CM | POA: Diagnosis not present

## 2019-08-26 DIAGNOSIS — D582 Other hemoglobinopathies: Secondary | ICD-10-CM

## 2019-08-26 DIAGNOSIS — M2550 Pain in unspecified joint: Secondary | ICD-10-CM | POA: Diagnosis not present

## 2019-08-26 DIAGNOSIS — R718 Other abnormality of red blood cells: Secondary | ICD-10-CM

## 2019-08-26 DIAGNOSIS — M5126 Other intervertebral disc displacement, lumbar region: Secondary | ICD-10-CM

## 2019-08-26 DIAGNOSIS — R7989 Other specified abnormal findings of blood chemistry: Secondary | ICD-10-CM

## 2019-08-26 MED ORDER — KETOROLAC TROMETHAMINE 60 MG/2ML IM SOLN
60.0000 mg | Freq: Once | INTRAMUSCULAR | Status: AC
  Administered 2019-08-26: 60 mg via INTRAMUSCULAR

## 2019-08-27 LAB — ANA W/REFLEX IF POSITIVE: Anti Nuclear Antibody (ANA): NEGATIVE

## 2019-08-27 LAB — RHEUMATOID FACTOR: Rheumatoid fact SerPl-aCnc: 10.2 IU/mL (ref 0.0–13.9)

## 2019-08-27 LAB — SEDIMENTATION RATE: Sed Rate: 9 mm/hr (ref 0–30)

## 2019-08-29 DIAGNOSIS — R7989 Other specified abnormal findings of blood chemistry: Secondary | ICD-10-CM | POA: Insufficient documentation

## 2019-08-29 DIAGNOSIS — D582 Other hemoglobinopathies: Secondary | ICD-10-CM | POA: Insufficient documentation

## 2019-08-30 ENCOUNTER — Telehealth: Payer: Self-pay

## 2019-08-30 DIAGNOSIS — M2550 Pain in unspecified joint: Secondary | ICD-10-CM

## 2019-08-30 NOTE — Telephone Encounter (Signed)
Copied from [place] [id number]. Topic: General - Other >> Aug 30, 2019  1:11 PM Hinda Lenis D wrote: Reason for CRM: PT was refer to rheumatology, the schedule him for November. He wants a different  Dr the can see him sooner / please advise

## 2019-08-30 NOTE — Telephone Encounter (Signed)
Try [facility]  Rhematology.

## 2019-08-31 ENCOUNTER — Telehealth: Payer: Self-pay

## 2019-08-31 NOTE — Telephone Encounter (Signed)
Referral has been placed to [facility] Rheumatology.

## 2019-08-31 NOTE — Telephone Encounter (Signed)
Patient given lab results through mychart and patient has read the provider's comments.

## 2019-08-31 NOTE — Telephone Encounter (Signed)
-----   Message from Jerrol Banana., MD sent at 08/30/2019  3:20 PM EDT ----- Labs all normal.

## 2019-11-15 DIAGNOSIS — M25532 Pain in left wrist: Secondary | ICD-10-CM | POA: Diagnosis not present

## 2019-11-16 DIAGNOSIS — M25532 Pain in left wrist: Secondary | ICD-10-CM | POA: Diagnosis not present

## 2019-12-03 DIAGNOSIS — Z85828 Personal history of other malignant neoplasm of skin: Secondary | ICD-10-CM | POA: Diagnosis not present

## 2019-12-03 DIAGNOSIS — D2262 Melanocytic nevi of left upper limb, including shoulder: Secondary | ICD-10-CM | POA: Diagnosis not present

## 2019-12-03 DIAGNOSIS — L408 Other psoriasis: Secondary | ICD-10-CM | POA: Diagnosis not present

## 2019-12-03 DIAGNOSIS — X32XXXA Exposure to sunlight, initial encounter: Secondary | ICD-10-CM | POA: Diagnosis not present

## 2019-12-03 DIAGNOSIS — D225 Melanocytic nevi of trunk: Secondary | ICD-10-CM | POA: Diagnosis not present

## 2019-12-03 DIAGNOSIS — L570 Actinic keratosis: Secondary | ICD-10-CM | POA: Diagnosis not present

## 2019-12-20 ENCOUNTER — Ambulatory Visit: Admit: 2019-12-20 | Discharge: 2019-12-21 | Payer: PRIVATE HEALTH INSURANCE

## 2019-12-20 ENCOUNTER — Encounter: Admit: 2019-12-20 | Discharge: 2019-12-21 | Payer: PRIVATE HEALTH INSURANCE

## 2019-12-20 DIAGNOSIS — Z86711 Personal history of pulmonary embolism: Secondary | ICD-10-CM | POA: Diagnosis not present

## 2019-12-20 DIAGNOSIS — L409 Psoriasis, unspecified: Secondary | ICD-10-CM | POA: Diagnosis not present

## 2019-12-20 DIAGNOSIS — M25529 Pain in unspecified elbow: Secondary | ICD-10-CM | POA: Diagnosis not present

## 2019-12-20 DIAGNOSIS — M79671 Pain in right foot: Secondary | ICD-10-CM | POA: Diagnosis not present

## 2019-12-20 DIAGNOSIS — M79672 Pain in left foot: Secondary | ICD-10-CM | POA: Diagnosis not present

## 2019-12-20 DIAGNOSIS — M19072 Primary osteoarthritis, left ankle and foot: Secondary | ICD-10-CM | POA: Diagnosis not present

## 2019-12-20 DIAGNOSIS — M898X5 Other specified disorders of bone, thigh: Secondary | ICD-10-CM | POA: Diagnosis not present

## 2019-12-20 DIAGNOSIS — M19071 Primary osteoarthritis, right ankle and foot: Secondary | ICD-10-CM | POA: Diagnosis not present

## 2019-12-20 DIAGNOSIS — L4050 Arthropathic psoriasis, unspecified: Secondary | ICD-10-CM | POA: Diagnosis not present

## 2019-12-20 DIAGNOSIS — M1611 Unilateral primary osteoarthritis, right hip: Secondary | ICD-10-CM | POA: Diagnosis not present

## 2019-12-24 DIAGNOSIS — L4050 Arthropathic psoriasis, unspecified: Principal | ICD-10-CM

## 2019-12-24 MED ORDER — HUMIRA PEN CITRATE FREE 40 MG/0.4 ML
SUBCUTANEOUS | 5 refills | 28.00000 days | Status: CP
Start: 2019-12-24 — End: ?
  Filled 2020-03-16: qty 2, 28d supply, fill #0

## 2019-12-27 DIAGNOSIS — L4050 Arthropathic psoriasis, unspecified: Principal | ICD-10-CM

## 2020-01-04 NOTE — Progress Notes (Signed)
I,[name],acting as a scribe for Tyler Durie, MD.,have documented all relevant documentation on the behalf of Tyler Durie, MD,as directed by  Tyler Durie, MD while in the presence of Tyler Durie, MD.   Complete physical exam   Patient: Tyler Mcclure   DOB: [birth year]   59 y.o. Male  MRN: [id number] Visit Date: 01/05/2020  Today's healthcare provider: [name], MD   Chief Complaint  Patient presents with  . Annual Exam   Subjective    MIKAELE Mcclure is a 59 y.o. male who presents today for a complete physical exam.  He reports consuming a general diet. Home exercise routine includes walking. He generally feels well. He reports sleeping fairly well. He does not have additional problems to discuss today.  HPI  He has been followed for new diagnosis of psoriatic arthritis followed at [facility] rheumatology  Past Medical History:  Diagnosis Date  . Arthritis   . Pulmonary embolus [facility])    Past Surgical History:  Procedure Laterality Date  . BICEPS TENDON REPAIR    . RESECTION DISTAL CLAVICAL Left 04/27/2015   Procedure: DISTAL CLAVICLE EXCISION;  Surgeon: [name], MD;  Location: [place];  Service: Orthopedics;  Laterality: Left;  . SHOULDER ARTHROSCOPY WITH ROTATOR CUFF REPAIR AND SUBACROMIAL DECOMPRESSION Left 04/27/2015   Procedure: LEFT SHOULDER ARTHROSCOPY WITH DEBRIDEMENT, ACROMIOPLASTY, ROTATOR CUFF REPAIR;  Surgeon: [name], MD;  Location: [place];  Service: Orthopedics;  Laterality: Left;  . SHOULDER SURGERY     Social History   Socioeconomic History  . Marital status: Married    Spouse name: Not on file  . Number of children: Not on file  . Years of education: Not on file  . Highest education level: Not on file  Occupational History  . Not on file  Tobacco Use  . Smoking status: Never Smoker  . Smokeless tobacco: Former Network engineer  . Vaping Use: Never used  Substance  and Sexual Activity  . Alcohol use: No    Alcohol/week: 0.0 standard drinks    Comment: OCCASIONALLY  . Drug use: No  . Sexual activity: Yes    Partners: Female    Comment: Married  Other Topics Concern  . Not on file  Social History Narrative  . Not on file   Social Determinants of Health   Financial Resource Strain:   . Difficulty of Paying Living Expenses: Not on file  Food Insecurity:   . Worried About Charity fundraiser in the Last Year: Not on file  . Ran Out of Food in the Last Year: Not on file  Transportation Needs:   . Lack of Transportation (Medical): Not on file  . Lack of Transportation (Non-Medical): Not on file  Physical Activity:   . Days of Exercise per Week: Not on file  . Minutes of Exercise per Session: Not on file  Stress:   . Feeling of Stress : Not on file  Social Connections:   . Frequency of Communication with Friends and Family: Not on file  . Frequency of Social Gatherings with Friends and Family: Not on file  . Attends Religious Services: Not on file  . Active Member of Clubs or Organizations: Not on file  . Attends Archivist Meetings: Not on file  . Marital Status: Not on file  Intimate Partner Violence:   . Fear of Current or Ex-Partner: Not on file  .  Emotionally Abused: Not on file  . Physically Abused: Not on file  . Sexually Abused: Not on file   Family Status  Relation Name Status  . Mother  Deceased at age 51  . Father  Deceased at age 62  . Sister 1 Alive  . Daughter  Alive  . Son  Alive  . Sister 2 Deceased   Family History  Problem Relation Age of Onset  . Breast cancer Mother   . Hyperlipidemia Mother   . Pancreatic cancer Father   . Healthy Sister   . Healthy Daughter   . Healthy Son    Allergies  Allergen Reactions  . Mercury   . Penicillins     Patient Care Team: Jerrol Banana., MD as PCP - General (Family Medicine)   Medications: Outpatient Medications Prior to Visit  Medication Sig    . aspirin EC 81 MG tablet Take 81 mg by mouth daily.  . Cholecalciferol (VITAMIN D) 2000 UNITS CAPS Take 1 capsule by mouth daily.  Marland Kitchen desonide (DESOWEN) 0.05 % cream apply to affected area twice a day if needed  . hydrocortisone (ANUSOL-HC) 2.5 % rectal cream Place 1 application rectally 2 (two) times daily.  . MULTIPLE VITAMIN PO Take 1 tablet by mouth daily.  . Omega-3 Fatty Acids (FISH OIL BURP-LESS) 1200 MG CAPS Take 2 capsules by mouth daily.  . vitamin E 400 UNIT capsule Take 1 capsule by mouth daily.   No facility-administered medications prior to visit.    Review of Systems  All other systems reviewed and are negative.   {Heme  Chem  Endocrine  Serology  Results Review (optional):23779::" "}  Objective    BP (!) 123/97 (BP Location: Left Arm, Patient Position: Sitting, Cuff Size: Large)   Pulse 82   Temp 98.6 F (37 C) (Oral)   Resp 16   Ht 6' (1.829 m)   Wt 214 lb (97.1 kg)   SpO2 99%   BMI 29.02 kg/m    Physical Exam Vitals reviewed.  Constitutional:      Appearance: He is well-developed.  HENT:     Head: Normocephalic and atraumatic.     Right Ear: External ear normal.     Nose: Nose normal.  Eyes:     Conjunctiva/sclera: Conjunctivae normal.     Pupils: Pupils are equal, round, and reactive to light.  Cardiovascular:     Rate and Rhythm: Normal rate and regular rhythm.     Heart sounds: Normal heart sounds.  Pulmonary:     Effort: Pulmonary effort is normal.     Breath sounds: Normal breath sounds.  Abdominal:     General: Bowel sounds are normal.     Palpations: Abdomen is soft.  Genitourinary:    Penis: Normal.      Testes: Normal.     Prostate: Normal.     Rectum: Normal.  Musculoskeletal:     Cervical back: Normal range of motion and neck supple.  Skin:    General: Skin is warm and dry.  Neurological:     General: No focal deficit present.     Mental Status: He is alert and oriented to person, place, and time.  Psychiatric:         Mood and Affect: Mood normal.        Behavior: Behavior normal.        Thought Content: Thought content normal.        Judgment: Judgment normal.  Last depression screening scores PHQ 2/9 Scores 01/05/2020 01/04/2019 12/30/2017  PHQ - 2 Score 0 0 0  PHQ- 9 Score 0 - -   Last fall risk screening Fall Risk  01/04/2019  Falls in the past year? 0  Number falls in past yr: 0  Injury with Fall? 0  Follow up Falls evaluation completed   Last Audit-C alcohol use screening Alcohol Use Disorder Test (AUDIT) 01/05/2020  1. How often do you have a drink containing alcohol? 2  2. How many drinks containing alcohol do you have on a typical day when you are drinking? 1  3. How often do you have six or more drinks on one occasion? 1  AUDIT-C Score 4  4. How often during the last year have you found that you were not able to stop drinking once you had started? 0  5. How often during the last year have you failed to do what was normally expected from you because of drinking? 0  6. How often during the last year have you needed a first drink in the morning to get yourself going after a heavy drinking session? 0  7. How often during the last year have you had a feeling of guilt of remorse after drinking? 0  8. How often during the last year have you been unable to remember what happened the night before because you had been drinking? 0  9. Have you or someone else been injured as a result of your drinking? 0  10. Has a relative or friend or a doctor or another health worker been concerned about your drinking or suggested you cut down? 0  Alcohol Use Disorder Identification Test Final Score (AUDIT) 4  Alcohol Brief Interventions/Follow-up AUDIT Score <7 follow-up not indicated   A score of 3 or more in women, and 4 or more in men indicates increased risk for alcohol abuse, EXCEPT if all of the points are from question 1   No results found for any visits on 01/05/20.  Assessment & Plan      Routine Health Maintenance and Physical Exam  Exercise Activities and Dietary recommendations Goals   None     Immunization History  Administered Date(s) Administered  . Td 01/04/2019  . Tdap 11/24/2008    Health Maintenance  Topic Date Due  . COVID-19 Vaccine (1) Never done  . HIV Screening  Never done  . INFLUENZA VACCINE  Never done  . COLONOSCOPY  02/23/2022  . TETANUS/TDAP  01/03/2029  . Hepatitis C Screening  Completed    Discussed health benefits of physical activity, and encouraged him to engage in regular exercise appropriate for his age and condition.  1. Annual physical exam  - Lipid panel - CBC w/Diff/Platelet - Comprehensive Metabolic Panel (CMET) - TSH - PSA  2. Prostate cancer screening  - Lipid panel - CBC w/Diff/Platelet - Comprehensive Metabolic Panel (CMET) - TSH - PSA  3. Left pulmonary embolus (HCC) Provoked after orthopedic surgery - Lipid panel - CBC w/Diff/Platelet - Comprehensive Metabolic Panel (CMET) - TSH - PSA  4. Screening for blood or protein in urine  - POCT urinalysis dipstick  5. Encounter for screening fecal occult blood testing  - IFOBT POC (occult bld, rslt in office); Future - IFOBT POC (occult bld, rslt in office) 6.  Psoriatic arthritis  No follow-ups on file.        Shelvy Perazzo Cranford Mon, MD  [facility] [phone] (phone) [phone] (fax)  [place]

## 2020-01-05 ENCOUNTER — Encounter: Payer: Self-pay | Admitting: Family Medicine

## 2020-01-05 ENCOUNTER — Ambulatory Visit (INDEPENDENT_AMBULATORY_CARE_PROVIDER_SITE_OTHER): Payer: BC Managed Care – PPO | Admitting: Family Medicine

## 2020-01-05 ENCOUNTER — Other Ambulatory Visit: Payer: Self-pay

## 2020-01-05 VITALS — BP 123/97 | HR 82 | Temp 98.6°F | Resp 16 | Ht 72.0 in | Wt 214.0 lb

## 2020-01-05 DIAGNOSIS — Z1389 Encounter for screening for other disorder: Secondary | ICD-10-CM | POA: Diagnosis not present

## 2020-01-05 DIAGNOSIS — Z1211 Encounter for screening for malignant neoplasm of colon: Secondary | ICD-10-CM

## 2020-01-05 DIAGNOSIS — Z125 Encounter for screening for malignant neoplasm of prostate: Secondary | ICD-10-CM | POA: Diagnosis not present

## 2020-01-05 DIAGNOSIS — L4050 Arthropathic psoriasis, unspecified: Secondary | ICD-10-CM

## 2020-01-05 DIAGNOSIS — Z0000 Encounter for general adult medical examination without abnormal findings: Secondary | ICD-10-CM

## 2020-01-05 DIAGNOSIS — I2699 Other pulmonary embolism without acute cor pulmonale: Secondary | ICD-10-CM

## 2020-01-05 DIAGNOSIS — E785 Hyperlipidemia, unspecified: Secondary | ICD-10-CM | POA: Diagnosis not present

## 2020-01-05 LAB — POCT URINALYSIS DIPSTICK
Appearance: NORMAL
Bilirubin, UA: NEGATIVE
Blood, UA: NEGATIVE
Glucose, UA: NEGATIVE
Ketones, UA: NEGATIVE
Leukocytes, UA: NEGATIVE
Nitrite, UA: NEGATIVE
Odor: NORMAL
Protein, UA: NEGATIVE
Spec Grav, UA: 1.02 (ref 1.010–1.025)
Urobilinogen, UA: 0.2 E.U./dL
pH, UA: 6.5 (ref 5.0–8.0)

## 2020-01-05 LAB — IFOBT (OCCULT BLOOD): IFOBT: NEGATIVE

## 2020-01-06 LAB — COMPREHENSIVE METABOLIC PANEL
ALT: 55 IU/L — ABNORMAL HIGH (ref 0–44)
AST: 31 IU/L (ref 0–40)
Albumin/Globulin Ratio: 1.7 (ref 1.2–2.2)
Albumin: 5 g/dL — ABNORMAL HIGH (ref 3.8–4.9)
Alkaline Phosphatase: 52 IU/L (ref 44–121)
BUN/Creatinine Ratio: 15 (ref 9–20)
BUN: 13 mg/dL (ref 6–24)
Bilirubin Total: 1.7 mg/dL — ABNORMAL HIGH (ref 0.0–1.2)
CO2: 23 mmol/L (ref 20–29)
Calcium: 9.8 mg/dL (ref 8.7–10.2)
Chloride: 102 mmol/L (ref 96–106)
Creatinine, Ser: 0.89 mg/dL (ref 0.76–1.27)
GFR calc Af Amer: 109 mL/min/{1.73_m2} (ref >59)
GFR calc non Af Amer: 94 mL/min/{1.73_m2} (ref >59)
Globulin, Total: 2.9 g/dL (ref 1.5–4.5)
Glucose: 93 mg/dL (ref 65–99)
Potassium: 4.3 mmol/L (ref 3.5–5.2)
Sodium: 143 mmol/L (ref 134–144)
Total Protein: 7.9 g/dL (ref 6.0–8.5)

## 2020-01-06 LAB — CBC WITH DIFFERENTIAL/PLATELET
Basophils Absolute: 0.1 10*3/uL (ref 0.0–0.2)
Basos: 1 %
EOS (ABSOLUTE): 0.1 10*3/uL (ref 0.0–0.4)
Eos: 2 %
Hematocrit: 50 % (ref 37.5–51.0)
Hemoglobin: 17.1 g/dL (ref 13.0–17.7)
Immature Grans (Abs): 0 10*3/uL (ref 0.0–0.1)
Immature Granulocytes: 0 %
Lymphocytes Absolute: 1.9 10*3/uL (ref 0.7–3.1)
Lymphs: 36 %
MCH: 30.8 pg (ref 26.6–33.0)
MCHC: 34.2 g/dL (ref 31.5–35.7)
MCV: 90 fL (ref 79–97)
Monocytes Absolute: 0.5 10*3/uL (ref 0.1–0.9)
Monocytes: 10 %
Neutrophils Absolute: 2.7 10*3/uL (ref 1.4–7.0)
Neutrophils: 51 %
Platelets: 199 10*3/uL (ref 150–450)
RBC: 5.55 x10E6/uL (ref 4.14–5.80)
RDW: 12.2 % (ref 11.6–15.4)
WBC: 5.3 10*3/uL (ref 3.4–10.8)

## 2020-01-06 LAB — LIPID PANEL
Chol/HDL Ratio: 4 ratio (ref 0.0–5.0)
Cholesterol, Total: 196 mg/dL (ref 100–199)
HDL: 49 mg/dL (ref >39)
LDL Chol Calc (NIH): 115 mg/dL — ABNORMAL HIGH (ref 0–99)
Triglycerides: 184 mg/dL — ABNORMAL HIGH (ref 0–149)
VLDL Cholesterol Cal: 32 mg/dL (ref 5–40)

## 2020-01-06 LAB — PSA: Prostate Specific Ag, Serum: 0.6 ng/mL (ref 0.0–4.0)

## 2020-01-06 LAB — TSH: TSH: 3.06 u[IU]/mL (ref 0.450–4.500)

## 2020-01-12 DIAGNOSIS — Z0001 Encounter for general adult medical examination with abnormal findings: Secondary | ICD-10-CM | POA: Diagnosis not present

## 2020-01-12 DIAGNOSIS — E039 Hypothyroidism, unspecified: Secondary | ICD-10-CM | POA: Diagnosis not present

## 2020-01-12 DIAGNOSIS — E782 Mixed hyperlipidemia: Secondary | ICD-10-CM | POA: Diagnosis not present

## 2020-01-12 DIAGNOSIS — Z79899 Other long term (current) drug therapy: Secondary | ICD-10-CM | POA: Diagnosis not present

## 2020-01-12 DIAGNOSIS — Z6833 Body mass index (BMI) 33.0-33.9, adult: Secondary | ICD-10-CM | POA: Diagnosis not present

## 2020-01-12 DIAGNOSIS — Z0000 Encounter for general adult medical examination without abnormal findings: Secondary | ICD-10-CM | POA: Diagnosis not present

## 2020-01-12 DIAGNOSIS — I10 Essential (primary) hypertension: Secondary | ICD-10-CM | POA: Diagnosis not present

## 2020-01-12 DIAGNOSIS — R7301 Impaired fasting glucose: Secondary | ICD-10-CM | POA: Diagnosis not present

## 2020-01-12 DIAGNOSIS — E6609 Other obesity due to excess calories: Secondary | ICD-10-CM | POA: Diagnosis not present

## 2020-01-14 DIAGNOSIS — E782 Mixed hyperlipidemia: Secondary | ICD-10-CM | POA: Diagnosis not present

## 2020-01-14 DIAGNOSIS — E039 Hypothyroidism, unspecified: Secondary | ICD-10-CM | POA: Diagnosis not present

## 2020-01-14 DIAGNOSIS — Z0000 Encounter for general adult medical examination without abnormal findings: Secondary | ICD-10-CM | POA: Diagnosis not present

## 2020-01-14 DIAGNOSIS — R7301 Impaired fasting glucose: Secondary | ICD-10-CM | POA: Diagnosis not present

## 2020-01-14 DIAGNOSIS — I10 Essential (primary) hypertension: Secondary | ICD-10-CM | POA: Diagnosis not present

## 2020-01-17 NOTE — Progress Notes (Deleted)
Office Visit Note  Patient: Tyler Mcclure             Date of Birth: 1960/06/09           MRN: [id number]             PCP: Jerrol Banana., MD Referring: Jerrol Banana.,* Visit Date: 01/31/2020 Occupation: _0 @  Subjective:  No chief complaint on file.   History of Present Illness: Tyler Mcclure is a 59 y.o. male ***   Activities of Daily Living:  Patient reports morning stiffness for *** {minute/hour:19697}.   Patient {ACTIONS;DENIES/REPORTS:21021675::"Denies"} nocturnal pain.  Difficulty dressing/grooming: {ACTIONS;DENIES/REPORTS:21021675::"Denies"} Difficulty climbing stairs: {ACTIONS;DENIES/REPORTS:21021675::"Denies"} Difficulty getting out of chair: {ACTIONS;DENIES/REPORTS:21021675::"Denies"} Difficulty using hands for taps, buttons, cutlery, and/or writing: {ACTIONS;DENIES/REPORTS:21021675::"Denies"}  No Rheumatology ROS completed.   PMFS History:  Patient Active Problem List   Diagnosis Date Noted  . Elevated ferritin, hemoglobin, and red blood cell count ([place]) 08/29/2019  . Herniated lumbar intervertebral disc 12/26/2016  . Plantar fasciitis 12/26/2016  . Left pulmonary embolus ([place]) 10/30/2015  . Pulmonary embolus ([place]) 05/24/2015  . Arthritis 05/24/2015  . Rupture of tendon of biceps, long head 11/25/2014  . Squamous cell carcinoma of skin 11/25/2014    Past Medical History:  Diagnosis Date  . Arthritis   . Pulmonary embolus (HCC)     Family History  Problem Relation Age of Onset  . Breast cancer Mother   . Hyperlipidemia Mother   . Pancreatic cancer Father   . Healthy Sister   . Healthy Daughter   . Healthy Son    Past Surgical History:  Procedure Laterality Date  . BICEPS TENDON REPAIR    . RESECTION DISTAL CLAVICAL Left 04/27/2015   Procedure: DISTAL CLAVICLE EXCISION;  Surgeon: [name], MD;  Location: [place];  Service: Orthopedics;  Laterality: Left;  . SHOULDER ARTHROSCOPY WITH ROTATOR CUFF REPAIR  AND SUBACROMIAL DECOMPRESSION Left 04/27/2015   Procedure: LEFT SHOULDER ARTHROSCOPY WITH DEBRIDEMENT, ACROMIOPLASTY, ROTATOR CUFF REPAIR;  Surgeon: [name], MD;  Location: [place];  Service: Orthopedics;  Laterality: Left;  . SHOULDER SURGERY     Social History   Social History Narrative  . Not on file   Immunization History  Administered Date(s) Administered  . Influenza-Unspecified 12/29/2019  . PFIZER SARS-COV-2 Vaccination 06/28/2019, 07/28/2019  . Td 01/04/2019  . Tdap 11/24/2008     Objective: Vital Signs: There were no vitals taken for this visit.   Physical Exam   Musculoskeletal Exam: ***  CDAI Exam: CDAI Score: -- Patient Global: --; Provider Global: -- Swollen: --; Tender: -- Joint Exam 01/31/2020   No joint exam has been documented for this visit   There is currently no information documented on the homunculus. Go to the Rheumatology activity and complete the homunculus joint exam.  Investigation: No additional findings.  Imaging: No results found.  Recent Labs: Lab Results  Component Value Date   WBC 5.3 01/05/2020   HGB 17.1 01/05/2020   PLT 199 01/05/2020   NA 143 01/05/2020   K 4.3 01/05/2020   CL 102 01/05/2020   CO2 23 01/05/2020   GLUCOSE 93 01/05/2020   BUN 13 01/05/2020   CREATININE 0.89 01/05/2020   BILITOT 1.7 (H) 01/05/2020   ALKPHOS 52 01/05/2020   AST 31 01/05/2020   ALT 55 (H) 01/05/2020   PROT 7.9 01/05/2020   ALBUMIN 5.0 (H) 01/05/2020   CALCIUM 9.8 01/05/2020   GFRAA 109 01/05/2020  Speciality Comments: No specialty comments available.  Procedures:  No procedures performed Allergies: Mercury and Penicillins   Assessment / Plan:     Visit Diagnoses: Polyarthralgia - 08/26/19: RF 10.2, ANA negative, ESR 9  Orders: No orders of the defined types were placed in this encounter.  No orders of the defined types were placed in this encounter.   Face-to-face time spent with patient was ***  minutes. Greater than 50% of time was spent in counseling and coordination of care.  Follow-Up Instructions: No follow-ups on file.   Ofilia Neas, PA-C  Note - This record has been created using Dragon software.  Chart creation errors have been sought, but may not always  have been located. Such creation errors do not reflect on  the standard of medical care.

## 2020-01-31 ENCOUNTER — Ambulatory Visit: Payer: BLUE CROSS/BLUE SHIELD | Admitting: Rheumatology

## 2020-01-31 DIAGNOSIS — C4492 Squamous cell carcinoma of skin, unspecified: Secondary | ICD-10-CM

## 2020-01-31 DIAGNOSIS — D582 Other hemoglobinopathies: Secondary | ICD-10-CM

## 2020-01-31 DIAGNOSIS — M722 Plantar fascial fibromatosis: Secondary | ICD-10-CM

## 2020-01-31 DIAGNOSIS — I2699 Other pulmonary embolism without acute cor pulmonale: Secondary | ICD-10-CM

## 2020-01-31 DIAGNOSIS — S46119D Strain of muscle, fascia and tendon of long head of biceps, unspecified arm, subsequent encounter: Secondary | ICD-10-CM

## 2020-01-31 DIAGNOSIS — M2550 Pain in unspecified joint: Secondary | ICD-10-CM

## 2020-01-31 DIAGNOSIS — M5126 Other intervertebral disc displacement, lumbar region: Secondary | ICD-10-CM

## 2020-02-03 ENCOUNTER — Other Ambulatory Visit: Payer: Self-pay

## 2020-02-03 ENCOUNTER — Ambulatory Visit (INDEPENDENT_AMBULATORY_CARE_PROVIDER_SITE_OTHER): Payer: BC Managed Care – PPO

## 2020-02-03 DIAGNOSIS — Z23 Encounter for immunization: Secondary | ICD-10-CM | POA: Diagnosis not present

## 2020-03-10 DIAGNOSIS — M65122 Other infective (teno)synovitis, left elbow: Secondary | ICD-10-CM | POA: Diagnosis not present

## 2020-03-14 NOTE — Unmapped (Signed)
[facility] Specialty Medication Onboarding    Specialty Medication: Humira CF Pen 40mg /0.18ml  Prior Authorization: Approved   Financial Assistance: Yes - copay card approved as secondary   Final Copay/Day Supply: $5 / 28 days    Insurance Restrictions: None     Notes to Pharmacist:     The triage team has completed the benefits investigation and has determined that the patient is able to fill this medication at [facility]. Please contact the patient to complete the onboarding or follow up with the prescribing physician as needed.

## 2020-03-15 MED ORDER — EMPTY CONTAINER
2 refills | 0 days
Start: 2020-03-15 — End: ?

## 2020-03-15 NOTE — Unmapped (Signed)
[facility]   Patient Onboarding/Medication Counseling    Joshua Mccall is a 59 y.o. male with psoriatic arthritis and psoriasis who I am counseling today on initiation of therapy.  I am speaking to the patient.    Was a Nurse, learning disability used for this call? No    Verified patient's date of birth / HIPAA.    Specialty medication(s) to be sent: Inflammatory Disorders: Humira      Non-specialty medications/supplies to be sent: sharps kit       Humira (adalimumab)    Medication & Administration     Dosage: Rheumatoid arthritis: Inject 40mg  under the skin every 14 days    Lab tests required prior to treatment initiation:  ??? Tuberculosis: Tuberculosis screening resulted in a non-reactive Quantiferon TB Gold assay.  ??? Hepatitis B: Hepatitis B serology studies are complete and non-reactive.    Administration:     Prefilled auto-injector pen  1. Gather all supplies needed for injection on a clean, flat working surface: medication pen removed from packaging, alcohol swab, sharps container, etc.  2. Look at the medication label - look for correct medication, correct dose, and check the expiration date  3. Look at the medication - the liquid visible in the window on the side of the pen device should appear clear and colorless  4. Lay the auto-injector pen on a flat surface and allow it to warm up to room temperature for at least 30-45 minutes  5. Select injection site - you can use the front of your thigh or your belly (but not the area 2 inches around your belly button); if someone else is giving you the injection you can also use your upper arm in the skin covering your triceps muscle  6. Prepare injection site - wash your hands and clean the skin at the injection site with an alcohol swab and let it air dry, do not touch the injection site again before the injection  7. Pull the 2 safety caps straight off - gray/white to uncover the needle cover and the plum cap to uncover the plum activator button, do not remove until immediately prior to injection and do not touch the white needle cover  8. Gently squeeze the area of cleaned skin and hold it firmly to create a firm surface at the selected injection site  9. Put the white needle cover against your skin at the injection site at a 90 degree angle, hold the pen such that you can see the clear medication window  10. Press down and hold the pen firmly against your skin, press the plum activator button to initiate the injection, there will be a click when the injection starts  11. Continue to hold the pen firmly against your skin for about 10-15 seconds - the window will start to turn solid yellow  12. To verify the injection is complete after 10-15 seconds, look and ensure the window is solid yellow and then pull the pen away from your skin  13. Dispose of the used auto-injector pen immediately in your sharps disposal container the needle will be covered automatically  14. If you see any blood at the injection site, press a cotton ball or gauze on the site and maintain pressure until the bleeding stops, do not rub the injection site    Adherence/Missed dose instructions:  If your injection is given more than 3 days after your scheduled injection date ??? consult your pharmacist for additional instructions on how to adjust your  dosing schedule.    Goals of Therapy     - Achieve symptom remission  - Slow disease progression  - Protection of remaining articular structures  - Maintenance of function  - Maintenance of effective psychosocial functioning    Side Effects & Monitoring Parameters     ??? Injection site reaction (redness, irritation, inflammation localized to the site of administration)  ??? Signs of a common cold ??? minor sore throat, runny or stuffy nose, etc.  ??? Upset stomach  ??? Headache    The following side effects should be reported to the provider:  ??? Signs of a hypersensitivity reaction ??? rash; hives; itching; red, swollen, blistered, or peeling skin; wheezing; tightness in the chest or throat; difficulty breathing, swallowing, or talking; swelling of the mouth, face, lips, tongue, or throat; etc.  ??? Reduced immune function ??? report signs of infection such as fever; chills; body aches; very bad sore throat; ear or sinus pain; cough; more sputum or change in color of sputum; pain with passing urine; wound that will not heal, etc.  Also at a slightly higher risk of some malignancies (mainly skin and blood cancers) due to this reduced immune function.  o In the case of signs of infection ??? the patient should hold the next dose of Humira?? and call your primary care provider to ensure adequate medical care.  Treatment may be resumed when infection is treated and patient is asymptomatic.  ??? Changes in skin ??? a new growth or lump that forms; changes in shape, size, or color of a previous mole or marking  ??? Signs of unexplained bruising or bleeding ??? throwing up blood or emesis that looks like coffee grounds; black, tarry, or bloody stool; etc.  ??? Signs of new or worsening heart failure ??? shortness of breath; sudden weight gain; heartbeat that is not normal; swelling in the arms or legs that is new or worse      Contraindications, Warnings, & Precautions     ??? Have your bloodwork checked as you have been told by your prescriber  ??? Talk with your doctor if you are pregnant, planning to become pregnant, or breastfeeding  ??? Discuss the possible need for holding your dose(s) of Humira?? when a planned procedure is scheduled with the prescriber as it may delay healing/recovery timeline       Drug/Food Interactions     ??? Medication list reviewed in Epic. The patient was instructed to inform the care team before taking any new medications or supplements. No drug interactions identified.   ??? Talk with you prescriber or pharmacist before receiving any live vaccinations while taking this medication and after you stop taking it    Storage, Handling Precautions, & Disposal     ??? Store this medication in the refrigerator.  Do not freeze  ??? If needed, you may store at room temperature for up to 14 days  ??? Store in original packaging, protected from light  ??? Do not shake  ??? Dispose of used syringes/pens in a sharps disposal container      Current Medications (including OTC/herbals), Comorbidities and Allergies     Current Outpatient Medications   Medication Sig Dispense Refill   ??? aspirin (ECOTRIN) 81 MG tablet Take 81 mg by mouth.     ??? betamethasone, augmented, (DIPROLENE) 0.05 % cream APPLY TO THE AFFECTED AREA ON ARMS TWICE DAILY AS NEEDED     ??? calcipotriene (DOVONOX) 0.005 % ointment APPLY TWICE DAILY MONDAY-FRIDAY     ???  cholecalciferol, vitamin D3-50 mcg, 2,000 unit,, 50 mcg (2,000 unit) cap Take 1 capsule by mouth.     ??? desonide (DESOWEN) 0.05 % cream apply to affected area twice a day if needed     ??? HUMIRA PEN CITRATE FREE 40 MG/0.4 ML Inject the contents of 1 pen (40 mg total) under the skin every fourteen (14) days. 2 each 5   ??? hydrocortisone 2.5 % cream Insert into the rectum.     ??? multivitamin (TAB-A-VITE/THERAGRAN) per tablet Take 1 tablet by mouth.     ??? omega 3-dha-epa-fish oil (FISH OIL) 100-160-1,000 mg cap Take 2 capsules by mouth.     ??? tumeric-ging-olive-oreg-capryl 100 mg-150 mg- 50 mg-150 mg cap Take by mouth.     ??? vitamin E-268 mg, 400 UNIT, 268 mg (400 UNIT) capsule Take 1 capsule by mouth.       No current facility-administered medications for this visit.       Allergies   Allergen Reactions   ??? Mercury    ??? Penicillins        There is no problem list on file for this patient.      Reviewed and up to date in Epic.    Appropriateness of Therapy     Is medication and dose appropriate based on diagnosis? Yes    Prescription has been clinically reviewed: Yes    Baseline Quality of Life Assessment      Rheumatology:   Quality of Life    On a scale of 1 ??? 10 with 1 representing not at all and 10 representing completely ??? how has your rheumatologic condition affected your:  Daily pain level?: decline to answer  Ability to complete your regular daily tasks (prepare meals, get dressed, etc.)?: decline to answer  Ability to participate in social or family activities?: decline to answer         Financial Information     Medication Assistance provided: Prior Authorization and Copay Assistance    Anticipated copay of $5.00 reviewed with patient. Verified delivery address.    Delivery Information     Scheduled delivery date: 03/16/2020    Expected start date: 03/20/2020    Medication will be delivered via Same Day Courier to the prescription address in [facility].  This shipment will not require a signature.      Explained the services we provide at [facility] and that each month we would call to set up refills.  Stressed importance of returning phone calls so that we could ensure they receive their medications in time each month.  Informed patient that we should be setting up refills 7-10 days prior to when they will run out of medication.  A pharmacist will reach out to perform a clinical assessment periodically.  Informed patient that a welcome packet and a drug information handout will be sent.      Patient verbalized understanding of the above information as well as how to contact the pharmacy at [phone] option 4 with any questions/concerns.  The pharmacy is open Monday through Friday 8:30am-4:30pm.  A pharmacist is available 24/7 via pager to answer any clinical questions they may have.    Patient Specific Needs     - Does the patient have any physical, cognitive, or cultural barriers? No    - Patient prefers to have medications discussed with  Patient     - Is the patient or caregiver able to read and understand education materials at a high  school level or above? Yes    - Patient's primary language is  English     - Is the patient high risk? No    - Does the patient require a Care Management Plan? No     - Does the patient require physician intervention or other additional services (i.e. nutrition, smoking cessation, social work)? No      Karene Fry Vivika Poythress  [facility] Specialty Pharmacist

## 2020-03-16 MED FILL — EMPTY CONTAINER: 120 days supply | Qty: 1 | Fill #0

## 2020-03-16 MED FILL — HUMIRA PEN CITRATE FREE 40 MG/0.4 ML: 28 days supply | Qty: 2 | Fill #0 | Status: AC

## 2020-03-16 MED FILL — EMPTY CONTAINER: 120 days supply | Qty: 1 | Fill #0 | Status: AC

## 2020-04-05 NOTE — Unmapped (Signed)
[facility] Clinical Assessment & Refill Coordination Note    Joshua Mccall, DOB: 26-Sep-1960  Phone: [phone] (home)     All above HIPAA information was verified with patient.     Was a Nurse, learning disability used for this call? No    Specialty Medication(s):   Inflammatory Disorders: Humira     Current Outpatient Medications   Medication Sig Dispense Refill   ??? aspirin (ECOTRIN) 81 MG tablet Take 81 mg by mouth.     ??? betamethasone, augmented, (DIPROLENE) 0.05 % cream APPLY TO THE AFFECTED AREA ON ARMS TWICE DAILY AS NEEDED     ??? calcipotriene (DOVONOX) 0.005 % ointment APPLY TWICE DAILY MONDAY-FRIDAY     ??? cholecalciferol, vitamin D3-50 mcg, 2,000 unit,, 50 mcg (2,000 unit) cap Take 1 capsule by mouth.     ??? desonide (DESOWEN) 0.05 % cream apply to affected area twice a day if needed     ??? empty container Misc Use as directed 1 each 2   ??? HUMIRA PEN CITRATE FREE 40 MG/0.4 ML Inject the contents of 1 pen (40mg ) under the skin every 14 days 2 each 5   ??? hydrocortisone 2.5 % cream Insert into the rectum.     ??? multivitamin (TAB-A-VITE/THERAGRAN) per tablet Take 1 tablet by mouth.     ??? omega 3-dha-epa-fish oil (FISH OIL) 100-160-1,000 mg cap Take 2 capsules by mouth.     ??? tumeric-ging-olive-oreg-capryl 100 mg-150 mg- 50 mg-150 mg cap Take by mouth.     ??? vitamin E-268 mg, 400 UNIT, 268 mg (400 UNIT) capsule Take 1 capsule by mouth.       No current facility-administered medications for this visit.        Changes to medications: Kj reports no changes at this time.    Allergies   Allergen Reactions   ??? Mercury    ??? Penicillins        Changes to allergies: No    SPECIALTY MEDICATION ADHERENCE     Humira 40mg /0.80ml: 0 days of medicine on hand       Medication Adherence    Patient reported X missed doses in the last month: 0  Specialty Medication: Humira          Specialty medication(s) dose(s) confirmed: Regimen is correct and unchanged.     Are there any concerns with adherence? No    Adherence counseling provided? Not needed    CLINICAL MANAGEMENT AND INTERVENTION      Clinical Benefit Assessment:    Do you feel the medicine is effective or helping your condition? Not yet, too early to tell    Clinical Benefit counseling provided? Reasonable expectations discussed: will take 2-3 months to be effective    Adverse Effects Assessment:    Are you experiencing any side effects? No    Are you experiencing difficulty administering your medicine? No    Quality of Life Assessment:    Rheumatology:   Quality of Life    On a scale of 1 ??? 10 with 1 representing not at all and 10 representing completely ??? how has your rheumatologic condition affected your:  Daily pain level?: 5  Ability to complete your regular daily tasks (prepare meals, get dressed, etc.)?: 1  Ability to participate in social or family activities?: 1         Have you discussed this with your provider? Not needed    Therapy Appropriateness:    Is therapy appropriate? Yes, therapy is  appropriate and should be continued    DISEASE/MEDICATION-SPECIFIC INFORMATION      For patients on injectable medications: Patient currently has 0 doses left.  Next injection is scheduled for 1/24.    PATIENT SPECIFIC NEEDS     - Does the patient have any physical, cognitive, or cultural barriers? No    - Is the patient high risk? No    - Does the patient require a Care Management Plan? No     - Does the patient require physician intervention or other additional services (i.e. nutrition, smoking cessation, social work)? No      SHIPPING     Specialty Medication(s) to be Shipped:   Inflammatory Disorders: Humira    Other medication(s) to be shipped: No additional medications requested for fill at this time     Changes to insurance: No    Delivery Scheduled: Yes, Expected medication delivery date: 1/19.     Medication will be delivered via Same Day Courier to the confirmed prescription address in [facility].    The patient will receive a drug information handout for each medication shipped and additional FDA Medication Guides as required.  Verified that patient has previously received a Conservation officer, historic buildings.    All of the patient's questions and concerns have been addressed.    Joshua Mccall   [facility] Specialty Pharmacist

## 2020-04-12 DIAGNOSIS — E782 Mixed hyperlipidemia: Secondary | ICD-10-CM | POA: Diagnosis not present

## 2020-04-12 DIAGNOSIS — I10 Essential (primary) hypertension: Secondary | ICD-10-CM | POA: Diagnosis not present

## 2020-04-12 MED FILL — HUMIRA PEN CITRATE FREE 40 MG/0.4 ML: SUBCUTANEOUS | 28 days supply | Qty: 2 | Fill #1

## 2020-04-14 DIAGNOSIS — E6609 Other obesity due to excess calories: Secondary | ICD-10-CM | POA: Diagnosis not present

## 2020-04-14 DIAGNOSIS — E782 Mixed hyperlipidemia: Secondary | ICD-10-CM | POA: Diagnosis not present

## 2020-04-14 DIAGNOSIS — R7301 Impaired fasting glucose: Secondary | ICD-10-CM | POA: Diagnosis not present

## 2020-04-14 DIAGNOSIS — I10 Essential (primary) hypertension: Secondary | ICD-10-CM | POA: Diagnosis not present

## 2020-04-21 DIAGNOSIS — M659 Synovitis and tenosynovitis, unspecified: Secondary | ICD-10-CM | POA: Diagnosis not present

## 2020-05-05 NOTE — Unmapped (Signed)
[facility] Refill Coordination Note    Specialty Medication(s) to be Shipped:   Inflammatory Disorders: Humira    Other medication(s) to be shipped: No additional medications requested for fill at this time     Joshua Mccall, DOB: [birth year]  Phone: [phone] (home)       All above HIPAA information was verified with patient.     Was a Nurse, learning disability used for this call? No    Completed refill call assessment today to schedule patient's medication shipment from the [facility] [phone]).       Specialty medication(s) and dose(s) confirmed: Regimen is correct and unchanged.   Changes to medications: Morley reports no changes at this time.  Changes to insurance: No  Questions for the pharmacist: No    Confirmed patient received Welcome Packet with first shipment. The patient will receive a drug information handout for each medication shipped and additional FDA Medication Guides as required.       DISEASE/MEDICATION-SPECIFIC INFORMATION        For patients on injectable medications: Patient currently has 0 doses left.  Next injection is scheduled for 2/21.    SPECIALTY MEDICATION ADHERENCE     Medication Adherence    Patient reported X missed doses in the last month: 0  Specialty Medication: Humira  Patient is on additional specialty medications: No  Patient is on more than two specialty medications: No  Any gaps in refill history greater than 2 weeks in the last 3 months: no  Demonstrates understanding of importance of adherence: yes  Informant: patient                Humira 40mg /0.50ml: Patient has 0 days of medication on hand      SHIPPING     Shipping address confirmed in Epic.     Delivery Scheduled: Yes, Expected medication delivery date: 2/18.     Medication will be delivered via Same Day Courier to the prescription address in Epic WAM.    Olga Millers   [facility] Specialty Technician

## 2020-05-06 DIAGNOSIS — S63591A Other specified sprain of right wrist, initial encounter: Secondary | ICD-10-CM | POA: Diagnosis not present

## 2020-05-06 DIAGNOSIS — Z77018 Contact with and (suspected) exposure to other hazardous metals: Secondary | ICD-10-CM | POA: Diagnosis not present

## 2020-05-06 DIAGNOSIS — S43431A Superior glenoid labrum lesion of right shoulder, initial encounter: Secondary | ICD-10-CM | POA: Diagnosis not present

## 2020-05-06 DIAGNOSIS — T1590XD Foreign body on external eye, part unspecified, unspecified eye, subsequent encounter: Secondary | ICD-10-CM | POA: Diagnosis not present

## 2020-05-06 DIAGNOSIS — M659 Synovitis and tenosynovitis, unspecified: Secondary | ICD-10-CM | POA: Diagnosis not present

## 2020-05-06 DIAGNOSIS — M25431 Effusion, right wrist: Secondary | ICD-10-CM | POA: Diagnosis not present

## 2020-05-06 DIAGNOSIS — M19031 Primary osteoarthritis, right wrist: Secondary | ICD-10-CM | POA: Diagnosis not present

## 2020-05-11 NOTE — Unmapped (Signed)
Rheumatology Clinic Follow-up Visit Note      Assessment and Plan: Joshua Mccall is a 60 y.o.  male with a PMH of provoked PE, inverse psoriasis, and psoriatic arthritis with tenosynovitis of the L 3rd toe and possible L sacroiliitis.  He is currently on Humira 40mg  Q 14 days with resolution of his L 3rd toe dactylitis. His ongoing left lateral hip pain is more likely related to prior muscular injury with calcifications seen on xray, and right hip pain may be related to OA seen on xray. Marland Kitchen He will continue on Humira.    Psoriatic arthritis w inverse psoriasis:  - Continue Humira 40mg  every 2 weeks     Medication Monitoring:   - CBC diff, CMP     Immunizations:   - COVID vaccines and booster completed (02/03/2020), discussed he can get a 4th dose 3 months after 3rd dose while on Humira, will get with PCP  - Flu vaccine 12/2019  - Pneumococcal vaccines: given Prevnar today, will be due for Pneumovax in follow-up     Return in about 4 months (around 09/12/2020).   4 months with Delorise Shiner  8 months with me     Janae Bridgeman, MD  [facility] Rheumatology   Pager [phone]    HPI: Joshua Mccall is a 60 y.o.  male with a PMH of provoked PE, inverse psoriasis, and psoriatic arthritis with tenosynovitis of the L 3rd toe and possible L sacroiliitis.     Disease history: Joshua Mccall has had inverse psoriasis since 2018-2019, with symptoms primarily in his groin region but more recently spreading to his wrist.  He was seen a dermatologist and had used topical corticosteroids which previously controlled his psoriasis. He notes he has chronic joint pain related to multiple injuries dating back as early as middle school. His prior injuries include bilateral rotator cuff disease with bilateral repairs.  He also had a right biceps tendon rupture with repair.  He also fractured both wrists. However, during 2021 he had worsening left third toe pain with a sensation of a marble under the foot and swelling with brownish-red discoloration, and tenosynovitis and arthritis seen on ultrasound.   In addition to his left third toe pain and swelling he also noted new right medial elbow pain and tenderness in 2021. He also had right lateral/posterior hip/buttocks pain that is worse in the morning and improves throughout the day, with possible left sarcoiliitis on xray.    Interval Events: He was last seen in Sept 2021. He was started on Humira 40mg  every 14 days about 2 months ago and had 5 doses so far. He notes his left 3rd toe pain and swelling with a marble sensation has resolved, as has the toe discoloration.  His psoriasis is also improving now though he had a flare a few weeks ago. It is on his arms and in his groin. He notes ongoing left lateral hip pain and notes this has been present on and off since a hamstring strain in 2005.     ROS: 14 systems were reviewed and are negative except for that mentioned in the HPI.     Allergies: Mercury and Penicillins     Immunizations:   COVID vaccine complete     PMHx:   Past Medical History:   Diagnosis Date   ??? Lumbar herniated disc    ??? Pulmonary embolism (CMS-HCC)      PSHx:   Past Surgical History:   Procedure Laterality Date   ??? l rotator  cuff  2010   ??? right bicep tendon repair  Right    ??? ROTATOR CUFF REPAIR Right 2017     Family Hx:   Family History   Problem Relation Age of Onset   ??? Psoriasis Neg Hx      Social Hx: never smoker     Medications:   Current Outpatient Medications   Medication Sig Dispense Refill   ??? aspirin (ECOTRIN) 81 MG tablet Take 81 mg by mouth.     ??? betamethasone, augmented, (DIPROLENE) 0.05 % cream APPLY TO THE AFFECTED AREA ON ARMS TWICE DAILY AS NEEDED     ??? calcipotriene (DOVONOX) 0.005 % ointment APPLY TWICE DAILY MONDAY-FRIDAY     ??? cholecalciferol, vitamin D3-50 mcg, 2,000 unit,, 50 mcg (2,000 unit) cap Take 1 capsule by mouth.     ??? desonide (DESOWEN) 0.05 % cream apply to affected area twice a day if needed     ??? empty container Misc Use as directed 1 each 2   ??? HUMIRA PEN CITRATE FREE 40 MG/0.4 ML Inject the contents of 1 pen (40mg ) under the skin every 14 days 2 each 5   ??? hydrocortisone 2.5 % cream Insert into the rectum.     ??? multivitamin (TAB-A-VITE/THERAGRAN) per tablet Take 1 tablet by mouth.     ??? omega 3-dha-epa-fish oil (FISH OIL) 100-160-1,000 mg cap Take 2 capsules by mouth.     ??? tumeric-ging-olive-oreg-capryl 100 mg-150 mg- 50 mg-150 mg cap Take by mouth.     ??? vitamin E-268 mg, 400 UNIT, 268 mg (400 UNIT) capsule Take 1 capsule by mouth.       No current facility-administered medications for this visit.     Physical Exam:    Vitals:    05/15/20 1114   BP: 144/91   BP Site: R Arm   BP Position: Sitting   BP Cuff Size: Medium   Pulse: 90   Temp: 36.3 ??C (97.4 ??F)   TempSrc: Temporal   Weight: 96.6 kg (213 lb)   Height: 182.9 cm (6')     GEN:  In no acute distress.  Well nourished and well kempt.  MSK:     Hands: Non-tender. No swelling. Full ROM. Able to make fist.    Wrists: Non-tender. No swelling. Slightly decreased ROM.    Elbows: Nontender, slightly decreased ROM. No overt swelling.    Shoulders: Non-tender. Slightly decreased ROM.     Back: Non-tender to mid-line palpation & SI joints.    Hips: Mild left tenderness, normal ROM    Knees: Non-tender. No swelling. Full ROM.    Ankles: Non-tender. No swelling. Full ROM.    Feet: No MTP swelling, tenderness or discoloration.  Skin: Few 1cm pink scaly plaques on the left arm.     Labs:    Lab Results   Component Value Date    WBC 5.9 12/20/2019    HGB 16.1 12/20/2019    HCT 47.2 12/20/2019    PLT 190 12/20/2019     Lab Results   Component Value Date    NA 141 12/20/2019    K 3.9 12/20/2019    CL 108 (H) 12/20/2019    CO2 24.8 12/20/2019    BUN 12 12/20/2019    CREATININE 0.78 12/20/2019    GLU 99 12/20/2019    CALCIUM 10.1 12/20/2019     Lab Results   Component Value Date    BILITOT 1.1 12/20/2019    PROT 7.7 12/20/2019  ALBUMIN 4.2 12/20/2019    ALT 73 (H) 12/20/2019    AST 38 (H) 12/20/2019    ALKPHOS 48 12/20/2019 RF normal   ANA negative  Hep B, C. HIV and Quant gold negative 11/2019    Imaging:     Left third MTP [place] 11/2019 - with small CPD negative effusion on dorsal longitudinal view, and CPD negative tendon sheath effusion on volar longitudinal and transverse views          Hip and pelvis xrays 11/2019:   Mild right hip osteoarthrosis.  Questionable left sacroiliitis.    Soft tissue calcifications adjacent to the left ischial tuberosity.    Right foot xray 11/2019:   Osteoarthrosis at the 1st metatarsophalangeal joints.

## 2020-05-12 DIAGNOSIS — S63592D Other specified sprain of left wrist, subsequent encounter: Secondary | ICD-10-CM | POA: Diagnosis not present

## 2020-05-12 DIAGNOSIS — M65132 Other infective (teno)synovitis, left wrist: Secondary | ICD-10-CM | POA: Diagnosis not present

## 2020-05-12 MED FILL — HUMIRA PEN CITRATE FREE 40 MG/0.4 ML: SUBCUTANEOUS | 28 days supply | Qty: 2 | Fill #2

## 2020-05-15 ENCOUNTER — Encounter: Admit: 2020-05-15 | Discharge: 2020-05-15 | Payer: PRIVATE HEALTH INSURANCE

## 2020-05-15 DIAGNOSIS — Z5181 Encounter for therapeutic drug level monitoring: Secondary | ICD-10-CM | POA: Diagnosis not present

## 2020-05-15 DIAGNOSIS — Z880 Allergy status to penicillin: Secondary | ICD-10-CM | POA: Diagnosis not present

## 2020-05-15 DIAGNOSIS — G8929 Other chronic pain: Secondary | ICD-10-CM | POA: Diagnosis not present

## 2020-05-15 DIAGNOSIS — M65879 Other synovitis and tenosynovitis, unspecified ankle and foot: Secondary | ICD-10-CM | POA: Diagnosis not present

## 2020-05-15 DIAGNOSIS — L4050 Arthropathic psoriasis, unspecified: Secondary | ICD-10-CM | POA: Diagnosis not present

## 2020-05-15 DIAGNOSIS — Z7982 Long term (current) use of aspirin: Secondary | ICD-10-CM | POA: Diagnosis not present

## 2020-05-15 DIAGNOSIS — Z6828 Body mass index (BMI) 28.0-28.9, adult: Secondary | ICD-10-CM | POA: Diagnosis not present

## 2020-05-15 DIAGNOSIS — L409 Psoriasis, unspecified: Secondary | ICD-10-CM | POA: Diagnosis not present

## 2020-05-15 DIAGNOSIS — M25552 Pain in left hip: Secondary | ICD-10-CM | POA: Diagnosis not present

## 2020-05-15 DIAGNOSIS — M19079 Primary osteoarthritis, unspecified ankle and foot: Secondary | ICD-10-CM | POA: Diagnosis not present

## 2020-05-15 DIAGNOSIS — Z23 Encounter for immunization: Principal | ICD-10-CM

## 2020-05-15 LAB — CBC W/ AUTO DIFF
BASOPHILS ABSOLUTE COUNT: 0.1 10*9/L (ref 0.0–0.1)
BASOPHILS RELATIVE PERCENT: 0.8 %
EOSINOPHILS ABSOLUTE COUNT: 0.2 10*9/L (ref 0.0–0.7)
EOSINOPHILS RELATIVE PERCENT: 2.8 %
HEMATOCRIT: 47.1 % (ref 38.0–50.0)
HEMOGLOBIN: 16.6 g/dL (ref 13.5–17.5)
LYMPHOCYTES ABSOLUTE COUNT: 1.5 10*9/L (ref 0.7–4.0)
LYMPHOCYTES RELATIVE PERCENT: 23.2 %
MEAN CORPUSCULAR HEMOGLOBIN CONC: 35.3 g/dL (ref 30.0–36.0)
MEAN CORPUSCULAR HEMOGLOBIN: 30.8 pg (ref 26.0–34.0)
MEAN CORPUSCULAR VOLUME: 87.4 fL (ref 81.0–95.0)
MEAN PLATELET VOLUME: 8.8 fL (ref 7.0–10.0)
MONOCYTES ABSOLUTE COUNT: 1 10*9/L (ref 0.1–1.0)
MONOCYTES RELATIVE PERCENT: 15.1 %
NEUTROPHILS ABSOLUTE COUNT: 3.9 10*9/L (ref 1.7–7.7)
NEUTROPHILS RELATIVE PERCENT: 58.1 %
NUCLEATED RED BLOOD CELLS: 0 /100{WBCs} (ref <=4)
PLATELET COUNT: 158 10*9/L (ref 150–450)
RED BLOOD CELL COUNT: 5.4 10*12/L (ref 4.32–5.72)
RED CELL DISTRIBUTION WIDTH: 13.1 % (ref 12.0–15.0)
WBC ADJUSTED: 6.7 10*9/L (ref 3.5–10.5)

## 2020-05-15 LAB — COMPREHENSIVE METABOLIC PANEL
ALBUMIN: 4.5 g/dL (ref 3.4–5.0)
ALKALINE PHOSPHATASE: 51 U/L (ref 46–116)
ALT (SGPT): 34 U/L (ref 10–49)
ANION GAP: 8 mmol/L (ref 5–14)
AST (SGOT): 29 U/L (ref <=34)
BILIRUBIN TOTAL: 1.6 mg/dL — ABNORMAL HIGH (ref 0.3–1.2)
BLOOD UREA NITROGEN: 14 mg/dL (ref 9–23)
BUN / CREAT RATIO: 19
CALCIUM: 10 mg/dL (ref 8.7–10.4)
CHLORIDE: 108 mmol/L — ABNORMAL HIGH (ref 98–107)
CO2: 24.2 mmol/L (ref 20.0–31.0)
CREATININE: 0.72 mg/dL
EGFR CKD-EPI AA MALE: >90 mL/min/{1.73_m2} (ref >=60)
EGFR CKD-EPI NON-AA MALE: >90 mL/min/{1.73_m2} (ref >=60)
GLUCOSE RANDOM: 102 mg/dL (ref 70–179)
POTASSIUM: 3.8 mmol/L (ref 3.4–4.5)
PROTEIN TOTAL: 7.7 g/dL (ref 5.7–8.2)
SODIUM: 140 mmol/L (ref 135–145)

## 2020-05-15 MED ORDER — HUMIRA PEN CITRATE FREE 40 MG/0.4 ML
SUBCUTANEOUS | 5 refills | 28.00000 days | Status: CP
Start: 2020-05-15 — End: ?
  Filled 2020-06-09: qty 2, 28d supply, fill #0

## 2020-05-15 NOTE — Unmapped (Addendum)
Continue Humira every 2 weeks     We will check monitoring labs today     You can get a 4th COVID booster now since your are Humira     We will give the 1st of two pneumococcal vaccine today.

## 2020-06-06 NOTE — Unmapped (Signed)
[facility] Refill Coordination Note    Specialty Medication(s) to be Shipped:   Inflammatory Disorders: Humira    Other medication(s) to be shipped: No additional medications requested for fill at this time     JHOSTIN EPPS, DOB: 1960/10/08  Phone: [phone] (home)       All above HIPAA information was verified with patient.     Was a Nurse, learning disability used for this call? No    Completed refill call assessment today to schedule patient's medication shipment from the [facility] [phone]).       Specialty medication(s) and dose(s) confirmed: Regimen is correct and unchanged.   Changes to medications: Braylen reports no changes at this time.  Changes to insurance: No  Questions for the pharmacist: No    Confirmed patient received Welcome Packet with first shipment. The patient will receive a drug information handout for each medication shipped and additional FDA Medication Guides as required.       DISEASE/MEDICATION-SPECIFIC INFORMATION        For patients on injectable medications: Patient currently has 0 doses left.  Next injection is scheduled for 3/21.    SPECIALTY MEDICATION ADHERENCE     Medication Adherence    Patient reported X missed doses in the last month: 0  Specialty Medication: Humira  Patient is on additional specialty medications: No  Patient is on more than two specialty medications: No  Any gaps in refill history greater than 2 weeks in the last 3 months: no  Demonstrates understanding of importance of adherence: yes  Informant: patient                Humira 40mg /0.84ml: Patient has 0 days of medication on hand      SHIPPING     Shipping address confirmed in Epic.     Delivery Scheduled: Yes, Expected medication delivery date: 3/18.     Medication will be delivered via Same Day Courier to the prescription address in Epic WAM.    Olga Millers   [facility] Specialty Technician

## 2020-06-16 DIAGNOSIS — S63592D Other specified sprain of left wrist, subsequent encounter: Secondary | ICD-10-CM | POA: Diagnosis not present

## 2020-06-16 DIAGNOSIS — M25532 Pain in left wrist: Secondary | ICD-10-CM | POA: Diagnosis not present

## 2020-07-04 NOTE — Progress Notes (Signed)
I,Tyler Mcclure,acting as a scribe for Tyler Durie, MD.,have documented all relevant documentation on the behalf of Tyler Durie, MD,as directed by  Tyler Durie, MD while in the presence of Tyler Durie, MD.   Established patient visit   Patient: Tyler Mcclure   DOB: [birth year]   60 y.o. Male  MRN: [id number] Visit Date: 07/05/2020  Today's healthcare provider: [name], MD   Chief Complaint  Patient presents with  . Follow-up   Subjective    HPI  Overall patient feels well and is continue to work on diet and exercise.  His weight is the lowest that it has been since 1995.  He continues to have problems with psoriatic arthritis but gets some significant relief with Humira per rheumatology.  He was given Prevnar by his rheumatologist before starting Humira He does have chronic numbness of the right lower leg from old lumbar radiculopathy.  Left pulmonary embolus ([place]) From 01/04/2021-Provoked after orthopedic surgery.      Medications: Outpatient Medications Prior to Visit  Medication Sig  . Adalimumab (HUMIRA PEN) 40 MG/0.4ML PNKT Inject into the skin.  Marland Kitchen aspirin EC 81 MG tablet Take 81 mg by mouth daily.  . Cholecalciferol (VITAMIN D) 2000 UNITS CAPS Take 1 capsule by mouth daily.  Marland Kitchen desonide (DESOWEN) 0.05 % cream apply to affected area twice a day if needed  . hydrocortisone (ANUSOL-HC) 2.5 % rectal cream Place 1 application rectally 2 (two) times daily.  . MULTIPLE VITAMIN PO Take 1 tablet by mouth daily.  . Omega-3 Fatty Acids (FISH OIL BURP-LESS) 1200 MG CAPS Take 2 capsules by mouth daily.  . vitamin E 400 UNIT capsule Take 1 capsule by mouth daily.   No facility-administered medications prior to visit.    Review of Systems  Constitutional: Negative for appetite change, chills and fever.  Respiratory: Negative for chest tightness, shortness of breath and wheezing.   Cardiovascular: Negative for chest pain and palpitations.   Gastrointestinal: Negative for abdominal pain, nausea and vomiting.        Objective    BP 129/87 (BP Location: Left Arm, Patient Position: Sitting, Cuff Size: Large)   Pulse 74   Temp 98.3 F (36.8 C) (Oral)   Resp 16   Ht 6' (1.829 m)   Wt 208 lb (94.3 kg)   SpO2 98%   BMI 28.21 kg/m  BP Readings from Last 3 Encounters:  07/05/20 129/87  01/05/20 (!) 123/97  08/26/19 126/84   Wt Readings from Last 3 Encounters:  07/05/20 208 lb (94.3 kg)  01/05/20 214 lb (97.1 kg)  08/26/19 223 lb 6.4 oz (101.3 kg)       Physical Exam Vitals reviewed.  Constitutional:      Appearance: He is well-developed.  HENT:     Head: Normocephalic and atraumatic.     Right Ear: External ear normal.     Nose: Nose normal.  Eyes:     Conjunctiva/sclera: Conjunctivae normal.     Pupils: Pupils are equal, round, and reactive to light.  Cardiovascular:     Rate and Rhythm: Normal rate and regular rhythm.     Heart sounds: Normal heart sounds.  Pulmonary:     Effort: Pulmonary effort is normal.     Breath sounds: Normal breath sounds.  Abdominal:     General: Bowel sounds are normal.     Palpations: Abdomen is soft.  Musculoskeletal:     Cervical back: Normal range of motion  and neck supple.  Skin:    General: Skin is warm and dry.  Neurological:     Mental Status: He is alert and oriented to person, place, and time. Mental status is at baseline.  Psychiatric:        Mood and Affect: Mood normal.        Behavior: Behavior normal.        Thought Content: Thought content normal.        Judgment: Judgment normal.       No results found for any visits on 07/05/20.  Assessment & Plan     1. Acute pulmonary embolism without acute cor pulmonale, unspecified pulmonary embolism type ([place]) This was provoked.  No further intervention.   2. Herniated lumbar intervertebral disc Medically improved  3. Psoriatic arthritis ([place]) Followed by rheumatology. Elevated blood pressure last  visit is better with weight loss.  I will see him back for physical in the year.   No follow-ups on file.      I, Tyler Durie, MD, have reviewed all documentation for this visit. The documentation on 07/05/20 for the exam, diagnosis, procedures, and orders are all accurate and complete.    Mirenda Baltazar Cranford Mon, MD  [facility] [phone] (phone) [phone] (fax)  No Name

## 2020-07-05 ENCOUNTER — Encounter: Payer: Self-pay | Admitting: Family Medicine

## 2020-07-05 ENCOUNTER — Other Ambulatory Visit: Payer: Self-pay

## 2020-07-05 ENCOUNTER — Ambulatory Visit: Payer: BC Managed Care – PPO | Admitting: Family Medicine

## 2020-07-05 VITALS — BP 129/87 | HR 74 | Temp 98.3°F | Resp 16 | Ht 72.0 in | Wt 208.0 lb

## 2020-07-05 DIAGNOSIS — I2699 Other pulmonary embolism without acute cor pulmonale: Secondary | ICD-10-CM

## 2020-07-05 DIAGNOSIS — L4050 Arthropathic psoriasis, unspecified: Secondary | ICD-10-CM | POA: Diagnosis not present

## 2020-07-05 DIAGNOSIS — M5126 Other intervertebral disc displacement, lumbar region: Secondary | ICD-10-CM

## 2020-07-05 NOTE — Unmapped (Signed)
[facility] Refill Coordination Note    Specialty Medication(s) to be Shipped:   Inflammatory Disorders: Humira    Other medication(s) to be shipped: No additional medications requested for fill at this time     Joshua Mccall, DOB: [birth year]  Phone: [phone] (home)       All above HIPAA information was verified with patient.     Was a Nurse, learning disability used for this call? No    Completed refill call assessment today to schedule patient's medication shipment from the [facility] [phone]).  All relevant notes have been reviewed.     Specialty medication(s) and dose(s) confirmed: Regimen is correct and unchanged.   Changes to medications: Joshua Mccall reports no changes at this time.  Changes to insurance: No  New side effects reported not previously addressed with a pharmacist or physician: None reported  Questions for the pharmacist: No    Confirmed patient received a Conservation officer, historic buildings and a Surveyor, mining with first shipment. The patient will receive a drug information handout for each medication shipped and additional FDA Medication Guides as required.       DISEASE/MEDICATION-SPECIFIC INFORMATION        For patients on injectable medications: Patient currently has 0 doses left.  Next injection is scheduled for 4/18.    SPECIALTY MEDICATION ADHERENCE     Medication Adherence    Patient reported X missed doses in the last month: 0  Specialty Medication: Humira  Patient is on additional specialty medications: No  Patient is on more than two specialty medications: No  Any gaps in refill history greater than 2 weeks in the last 3 months: no  Demonstrates understanding of importance of adherence: yes  Informant: patient              Were doses missed due to medication being on hold? No    Humira 40mg /0.18ml: Patient 0 days of medication on hand    REFERRAL TO PHARMACIST     Referral to the pharmacist: Not needed      [facility]     Shipping address confirmed in Epic.     Delivery Scheduled: Yes, Expected medication delivery date: 4/15.     Medication will be delivered via Same Day Courier to the prescription address in Epic WAM.    Olga Millers   [facility] Specialty Technician

## 2020-07-07 MED FILL — HUMIRA PEN CITRATE FREE 40 MG/0.4 ML: SUBCUTANEOUS | 28 days supply | Qty: 2 | Fill #1

## 2020-07-28 NOTE — Unmapped (Signed)
[facility] Refill Coordination Note    Specialty Medication(s) to be Shipped:   Inflammatory Disorders: Humira    Other medication(s) to be shipped: No additional medications requested for fill at this time     INMAN FETTIG, DOB: [birth year]  Phone: [phone] (home)       All above HIPAA information was verified with patient.     Was a Nurse, learning disability used for this call? No    Completed refill call assessment today to schedule patient's medication shipment from the [facility] [phone]).  All relevant notes have been reviewed.     Specialty medication(s) and dose(s) confirmed: Regimen is correct and unchanged.   Changes to medications: Therron reports no changes at this time.  Changes to insurance: No  New side effects reported not previously addressed with a pharmacist or physician: None reported  Questions for the pharmacist: No    Confirmed patient received a Conservation officer, historic buildings and a Surveyor, mining with first shipment. The patient will receive a drug information handout for each medication shipped and additional FDA Medication Guides as required.       DISEASE/MEDICATION-SPECIFIC INFORMATION        For patients on injectable medications: Patient currently has 0 doses left.  Next injection is scheduled for 5.15.    SPECIALTY MEDICATION ADHERENCE     Medication Adherence    Patient reported X missed doses in the last month: 0  Specialty Medication: Humira  Patient is on additional specialty medications: No  Patient is on more than two specialty medications: No  Any gaps in refill history greater than 2 weeks in the last 3 months: no  Demonstrates understanding of importance of adherence: yes  Informant: patient              Were doses missed due to medication being on hold? No    Humira 40mg /0.69ml: Patient has 0 days of medication on hand       REFERRAL TO PHARMACIST     Referral to the pharmacist: Not needed      [facility]     Shipping address confirmed in Epic. Delivery Scheduled: Yes, Expected medication delivery date: 5/13.     Medication will be delivered via Same Day Courier to the prescription address in Epic WAM.    Olga Millers   [facility] Specialty Technician

## 2020-08-04 MED FILL — HUMIRA PEN CITRATE FREE 40 MG/0.4 ML: SUBCUTANEOUS | 28 days supply | Qty: 2 | Fill #2

## 2020-08-25 NOTE — Unmapped (Signed)
[facility] Clinical Assessment & Refill Coordination Note    Joshua Mccall, DOB: 1960/12/21  Phone: [phone] (home)     All above HIPAA information was verified with patient.     Was a Nurse, learning disability used for this call? No    Specialty Medication(s):   Inflammatory Disorders: Humira     Current Outpatient Medications   Medication Sig Dispense Refill   ??? aspirin (ECOTRIN) 81 MG tablet Take 81 mg by mouth.     ??? betamethasone, augmented, (DIPROLENE) 0.05 % cream APPLY TO THE AFFECTED AREA ON ARMS TWICE DAILY AS NEEDED     ??? calcipotriene (DOVONOX) 0.005 % ointment APPLY TWICE DAILY MONDAY-FRIDAY     ??? cholecalciferol, vitamin D3-50 mcg, 2,000 unit,, 50 mcg (2,000 unit) cap Take 1 capsule by mouth.     ??? desonide (DESOWEN) 0.05 % cream apply to affected area twice a day if needed     ??? empty container Misc Use as directed 1 each 2   ??? HUMIRA PEN CITRATE FREE 40 MG/0.4 ML Inject the contents of 1 pen (40mg ) under the skin every 14 days 2 each 5   ??? hydrocortisone 2.5 % cream Insert into the rectum.     ??? multivitamin (TAB-A-VITE/THERAGRAN) per tablet Take 1 tablet by mouth.     ??? omega 3-dha-epa-fish oil (FISH OIL) 100-160-1,000 mg cap Take 2 capsules by mouth.     ??? tumeric-ging-olive-oreg-capryl 100 mg-150 mg- 50 mg-150 mg cap Take by mouth.     ??? vitamin E-268 mg, 400 UNIT, 268 mg (400 UNIT) capsule Take 1 capsule by mouth.       No current facility-administered medications for this visit.        Changes to medications: Ozro reports no changes at this time.    Allergies   Allergen Reactions   ??? Mercury    ??? Penicillins        Changes to allergies: No    SPECIALTY MEDICATION ADHERENCE     Humira 40mg /0.46mL : 10 days of medicine on hand     Medication Adherence    Patient reported X missed doses in the last month: 0  Specialty Medication: Humira 40mg /0.18mL  Informant: patient          Specialty medication(s) dose(s) confirmed: Regimen is correct and unchanged.     Are there any concerns with adherence? No    Adherence counseling provided? Not needed    CLINICAL MANAGEMENT AND INTERVENTION      Clinical Benefit Assessment:    Do you feel the medicine is effective or helping your condition? Yes    Clinical Benefit counseling provided? Progress note from 05/15/20 shows evidence of clinical benefit    Adverse Effects Assessment:    Are you experiencing any side effects? Yes, patient reports experiencing mild injection site irritation with last 3 injections. He reports the area has become red and itchy at injection site. He can also feel a small knot where he gave the injection. He reports these syrmptoms last around 3 days then resolve.. Side effect counseling provided: Mr Sikorski reports he has spoken with prescriber office regarding this injection site irritation. They suggest he take an anti-histamine like zyrtec or claritin prior to injection to see if this helps. He says he has been injecting into his leg. I suggest he try abdomen for next dose to see if better tolerated. Also counseled he can use some OTC hydrocortisone cream to the area of irritation to  help with itching. He voices understanding of these recommendations. He has a follow up appt scheduled for 09/22/20 and will plan to discuss further with provider at that time.    Are you experiencing difficulty administering your medicine? No    Quality of Life Assessment:    Rheumatology:   Quality of Life    On a scale of 1 - 10 with 1 representing not at all and 10 representing completely - how has your rheumatologic condition affected your:  Daily pain level?: decline to answer  Ability to complete your regular daily tasks (prepare meals, get dressed, etc.)?: decline to answer  Ability to participate in social or family activities?: decline to answer         Have you discussed this with your provider? Not needed    Acute Infection Status:    Acute infections noted within Epic:  No active infections  Patient reported infection: None    Therapy Appropriateness:    Is therapy appropriate? Yes, therapy is appropriate and should be continued    DISEASE/MEDICATION-SPECIFIC INFORMATION      For patients on injectable medications: Patient currently has 0 doses left.  Next injection is scheduled for 09/03/20.    PATIENT SPECIFIC NEEDS     - Does the patient have any physical, cognitive, or cultural barriers? No    - Is the patient high risk? No    - Does the patient require a Care Management Plan? No     - Does the patient require physician intervention or other additional services (i.e. nutrition, smoking cessation, social work)? No      SHIPPING     Specialty Medication(s) to be Shipped:   Inflammatory Disorders: Humira    Other medication(s) to be shipped: No additional medications requested for fill at this time     Changes to insurance: No    Delivery Scheduled: Yes, Expected medication delivery date: 09/01/20.     Medication will be delivered via Same Day Courier to the confirmed prescription address in [facility].    The patient will receive a drug information handout for each medication shipped and additional FDA Medication Guides as required.  Verified that patient has previously received a Conservation officer, historic buildings and a Surveyor, mining.    The patient or caregiver noted above participated in the development of this care plan and knows that they can request review of or adjustments to the care plan at any time.      All of the patient's questions and concerns have been addressed.    Camillo Flaming   [facility] Specialty Pharmacist

## 2020-09-01 MED FILL — HUMIRA PEN CITRATE FREE 40 MG/0.4 ML: SUBCUTANEOUS | 28 days supply | Qty: 2 | Fill #3

## 2020-09-19 NOTE — Unmapped (Signed)
Rheumatology Clinic Follow-up Visit Note      Assessment and Plan: Joshua Mccall is a 60 y.o.  male with a PMH of provoked PE, inverse psoriasis, and psoriatic arthritis with tenosynovitis of the L 3rd toe and possible L sacroiliitis.  He is currently on Humira 40mg  Q 14 days and reporting good control of joints and psoriasis. He has had site reactions to past four Humira pens and Zyrtec hasn't helped. Discussed option of switching to prefilled Syringes, which pt in agreement with.    Psoriatic arthritis w inverse psoriasis:  - Continue Humira 40mg  every 2 weeks, but switch to prefilled syringe. New order sent in.     Medication Monitoring:   - CBC diff, Crt, AST ALT today     Immunizations:   - COVID vaccines and boosterx2  - Flu vaccine 12/2019  - Pneumococcal vaccines: Prevnar delivered 04/2020, received Pneumovax today    F/U as scheduled in October with Dr. Marshall Cork    I personally spent 30 minutes face-to-face and non-face-to-face in the care of this patient, which includes all pre, intra, and post visit time on the date of service.      HPI: Joshua Mccall is a 60 y.o.  male with a PMH of provoked PE, inverse psoriasis, and psoriatic arthritis with tenosynovitis of the L 3rd toe and possible L sacroiliitis.     Disease history: Joshua Mccall has had inverse psoriasis since 2018-2019, with symptoms primarily in his groin region but more recently spreading to his wrist.  He was seen a dermatologist and had used topical corticosteroids which previously controlled his psoriasis. He notes he has chronic joint pain related to multiple injuries dating back as early as middle school. His prior injuries include bilateral rotator cuff disease with bilateral repairs.  He also had a right biceps tendon rupture with repair.  He also fractured both wrists. However, during 2021 he had worsening left third toe pain with a sensation of a marble under the foot and swelling with brownish-red discoloration, and tenosynovitis and arthritis seen on ultrasound.   In addition to his left third toe pain and swelling he also noted new right medial elbow pain and tenderness in 2021. He also had right lateral/posterior hip/buttocks pain that is worse in the morning and improves throughout the day, with possible left sarcoiliitis on xray.    Last Visit: 05/15/20 with Dr. Marshall Cork. His disease was well controlled at this visit.     Interval Events:   Pt presents for f/u today. He reports he has had injection site reactions to his last four Humira injections. He is rotating sites and has tried taking Zyrtec but nothing seems to help. The site reaction typically swells to a little larger than a golf ball, then resolves in a few days. He is interested in possibly switching to syringes.     His joint have been good. He reports some hip pain but overall improved. He denies any foot pain and denies recent joint swelling. He reports AM stiffness which lasts 20-30 min. His psoriasis has nearly resolved. He is overall quite happy with results from Humira.     He denies recent fever or chills. He denies recent illness or infections. He has had all four COVID vaccines.     ROS: 14 systems were reviewed and are negative except for that mentioned in the HPI.     Allergies: Mercury and Penicillins     Immunizations:   COVID vaccine complete     PMHx:  Past Medical History:   Diagnosis Date   ??? Lumbar herniated disc    ??? Pulmonary embolism (CMS-HCC)      PSHx:   Past Surgical History:   Procedure Laterality Date   ??? l rotator cuff  2010   ??? right bicep tendon repair  Right    ??? ROTATOR CUFF REPAIR Right 2017     Family Hx:   Family History   Problem Relation Age of Onset   ??? Psoriasis Neg Hx      Social Hx: never smoker     Medications:   Current Outpatient Medications   Medication Sig Dispense Refill   ??? aspirin (ECOTRIN) 81 MG tablet Take 81 mg by mouth.     ??? betamethasone, augmented, (DIPROLENE) 0.05 % cream APPLY TO THE AFFECTED AREA ON ARMS TWICE DAILY AS NEEDED     ??? calcipotriene (DOVONOX) 0.005 % ointment APPLY TWICE DAILY MONDAY-FRIDAY     ??? cholecalciferol, vitamin D3-50 mcg, 2,000 unit,, 50 mcg (2,000 unit) cap Take 1 capsule by mouth.     ??? desonide (DESOWEN) 0.05 % cream apply to affected area twice a day if needed     ??? empty container Misc Use as directed 1 each 2   ??? HUMIRA PEN CITRATE FREE 40 MG/0.4 ML Inject the contents of 1 pen (40mg ) under the skin every 14 days 2 each 5   ??? hydrocortisone 2.5 % cream Insert into the rectum.     ??? multivitamin (TAB-A-VITE/THERAGRAN) per tablet Take 1 tablet by mouth.     ??? omega 3-dha-epa-fish oil (FISH OIL) 100-160-1,000 mg cap Take 2 capsules by mouth.     ??? tumeric-ging-olive-oreg-capryl 100 mg-150 mg- 50 mg-150 mg cap Take by mouth.     ??? vitamin E-268 mg, 400 UNIT, 268 mg (400 UNIT) capsule Take 1 capsule by mouth.       No current facility-administered medications for this visit.     Physical Exam:    Vitals:    09/22/20 1019   BP: 135/88   Pulse: 79   Temp: 36.5 ??C (97.7 ??F)   TempSrc: Temporal   Weight: 95.3 kg (210 lb)     GEN:  In no acute distress.  Well nourished and well kempt.  MSK:     Hands: Non-tender. No swelling. Full ROM. Able to make fist.    Wrists: Non-tender. No swelling. Slightly decreased ROM.    Elbows: Nontender, slightly decreased ROM. No overt swelling.    Shoulders: Non-tender. Slightly decreased ROM.     Back: Non-tender to mid-line palpation & SI joints.    Hips: non tender, normal ROM    Knees: Non-tender. No swelling. Full ROM.    Ankles: Non-tender. No swelling. Full ROM.    Feet: No MTP swelling, tenderness or discoloration.  Skin: one small pink scaly plaque on the left wrist.     Labs:    Lab Results   Component Value Date    WBC 6.7 05/15/2020    HGB 16.6 05/15/2020    HCT 47.1 05/15/2020    PLT 158 05/15/2020     Lab Results   Component Value Date    NA 140 05/15/2020    K 3.8 05/15/2020    CL 108 (H) 05/15/2020    CO2 24.2 05/15/2020    BUN 14 05/15/2020    CREATININE 0.72 05/15/2020 GLU 102 05/15/2020    CALCIUM 10.0 05/15/2020     Lab Results   Component Value Date  BILITOT 1.6 (H) 05/15/2020    PROT 7.7 05/15/2020    ALBUMIN 4.5 05/15/2020    ALT 34 05/15/2020    AST 29 05/15/2020    ALKPHOS 51 05/15/2020     RF normal   ANA negative  Hep B, C. HIV and Quant gold negative 11/2019    Imaging:     Left third MTP [place] 11/2019 - with small CPD negative effusion on dorsal longitudinal view, and CPD negative tendon sheath effusion on volar longitudinal and transverse views          Hip and pelvis xrays 11/2019:   Mild right hip osteoarthrosis.  Questionable left sacroiliitis.    Soft tissue calcifications adjacent to the left ischial tuberosity.    Right foot xray 11/2019:   Osteoarthrosis at the 1st metatarsophalangeal joints.

## 2020-09-22 ENCOUNTER — Encounter: Admit: 2020-09-22 | Discharge: 2020-09-23 | Payer: PRIVATE HEALTH INSURANCE | Attending: Family | Primary: Family

## 2020-09-22 DIAGNOSIS — Z79899 Other long term (current) drug therapy: Secondary | ICD-10-CM | POA: Diagnosis not present

## 2020-09-22 DIAGNOSIS — Z6828 Body mass index (BMI) 28.0-28.9, adult: Secondary | ICD-10-CM | POA: Diagnosis not present

## 2020-09-22 DIAGNOSIS — Z7982 Long term (current) use of aspirin: Secondary | ICD-10-CM | POA: Diagnosis not present

## 2020-09-22 DIAGNOSIS — Z880 Allergy status to penicillin: Secondary | ICD-10-CM | POA: Diagnosis not present

## 2020-09-22 DIAGNOSIS — Z5181 Encounter for therapeutic drug level monitoring: Secondary | ICD-10-CM | POA: Diagnosis not present

## 2020-09-22 DIAGNOSIS — L4050 Arthropathic psoriasis, unspecified: Secondary | ICD-10-CM | POA: Diagnosis not present

## 2020-09-22 LAB — CBC W/ AUTO DIFF
BASOPHILS ABSOLUTE COUNT: 0 10*9/L (ref 0.0–0.1)
BASOPHILS RELATIVE PERCENT: 0.7 %
EOSINOPHILS ABSOLUTE COUNT: 0.6 10*9/L — ABNORMAL HIGH (ref 0.0–0.5)
EOSINOPHILS RELATIVE PERCENT: 9.9 %
HEMATOCRIT: 45.7 % (ref 39.0–48.0)
HEMOGLOBIN: 16.3 g/dL (ref 12.9–16.5)
LYMPHOCYTES ABSOLUTE COUNT: 2.2 10*9/L (ref 1.1–3.6)
LYMPHOCYTES RELATIVE PERCENT: 38 %
MEAN CORPUSCULAR HEMOGLOBIN CONC: 35.6 g/dL (ref 32.0–36.0)
MEAN CORPUSCULAR HEMOGLOBIN: 31.4 pg (ref 25.9–32.4)
MEAN CORPUSCULAR VOLUME: 88.1 fL (ref 77.6–95.7)
MEAN PLATELET VOLUME: 8.5 fL (ref 6.8–10.7)
MONOCYTES ABSOLUTE COUNT: 0.6 10*9/L (ref 0.3–0.8)
MONOCYTES RELATIVE PERCENT: 11.3 %
NEUTROPHILS ABSOLUTE COUNT: 2.3 10*9/L (ref 1.8–7.8)
NEUTROPHILS RELATIVE PERCENT: 40.1 %
NUCLEATED RED BLOOD CELLS: 0 /100{WBCs} (ref <=4)
PLATELET COUNT: 188 10*9/L (ref 150–450)
RED BLOOD CELL COUNT: 5.19 10*12/L (ref 4.26–5.60)
RED CELL DISTRIBUTION WIDTH: 12.3 % (ref 12.2–15.2)
WBC ADJUSTED: 5.7 10*9/L (ref 3.6–11.2)

## 2020-09-22 LAB — AST: AST (SGOT): 26 U/L (ref <=34)

## 2020-09-22 LAB — CREATININE
CREATININE: 0.78 mg/dL
EGFR CKD-EPI (2021) MALE: >90 mL/min/{1.73_m2} (ref >=60)

## 2020-09-22 LAB — ALT: ALT (SGPT): 42 U/L (ref 10–49)

## 2020-09-22 MED ORDER — HUMIRA SYRINGE CITRATE FREE 40 MG/0.4 ML
SUBCUTANEOUS | 3 refills | 84 days | Status: CP
Start: 2020-09-22 — End: 2021-09-22

## 2020-09-22 MED ORDER — HUMIRA 40 MG/0.8 ML SUBCUTANEOUS SYRINGE KIT
SUBCUTANEOUS | 3 refills | 84.00000 days | Status: CP
Start: 2020-09-22 — End: 2020-09-22
  Filled 2020-09-29: qty 2, 28d supply, fill #0

## 2020-09-23 NOTE — Unmapped (Signed)
Clinical Assessment Needed For: Formulation Change  Medication: Humira cf SYRINGE 40mg /0.60ml  Last Fill Date/Day Supply: 09/01/20 / 28 days  Copay $5  Was previous dose already scheduled to fill: No    Notes to Pharmacist:

## 2020-09-26 NOTE — Unmapped (Signed)
[facility] Clinical Assessment & Refill Coordination Note    Joshua Mccall, DOB: [birth year]  Phone: [phone] (home)     All above HIPAA information was verified with patient.     Was a Nurse, learning disability used for this call? No    Specialty Medication(s):   Inflammatory Disorders: Humira     Current Outpatient Medications   Medication Sig Dispense Refill   ??? aspirin (ECOTRIN) 81 MG tablet Take 81 mg by mouth.     ??? betamethasone, augmented, (DIPROLENE) 0.05 % cream APPLY TO THE AFFECTED AREA ON ARMS TWICE DAILY AS NEEDED     ??? calcipotriene (DOVONOX) 0.005 % ointment APPLY TWICE DAILY MONDAY-FRIDAY     ??? cholecalciferol, vitamin D3-50 mcg, 2,000 unit,, 50 mcg (2,000 unit) cap Take 1 capsule by mouth.     ??? desonide (DESOWEN) 0.05 % cream apply to affected area twice a day if needed     ??? empty container Misc Use as directed 1 each 2   ??? HUMIRA PEN CITRATE FREE 40 MG/0.4 ML Inject the contents of 1 pen (40mg ) under the skin every 14 days 2 each 5   ??? HUMIRA SYRINGE CITRATE FREE 40 MG/0.4 ML Inject 0.4 mL (40 mg total) under the skin every fourteen (14) days. 6 each 3   ??? hydrocortisone 2.5 % cream Insert into the rectum.     ??? multivitamin (TAB-A-VITE/THERAGRAN) per tablet Take 1 tablet by mouth.     ??? omega 3-dha-epa-fish oil (FISH OIL) 100-160-1,000 mg cap Take 2 capsules by mouth.     ??? tumeric-ging-olive-oreg-capryl 100 mg-150 mg- 50 mg-150 mg cap Take by mouth.     ??? vitamin E-268 mg, 400 UNIT, 268 mg (400 UNIT) capsule Take 1 capsule by mouth.       No current facility-administered medications for this visit.        Changes to medications: Tyree reports no changes at this time.    Allergies   Allergen Reactions   ??? Mercury    ??? Penicillins        Changes to allergies: No    SPECIALTY MEDICATION ADHERENCE     Humira 40mg /0.79mL : 6 days of medicine on hand       Medication Adherence    Patient reported X missed doses in the last month: 0  Specialty Medication: Humira 40mg /0.46mL  Informant: patient          Specialty medication(s) dose(s) confirmed: Patient reports changes to the regimen as follows: changing from Humira pens to syringes due to injection site reaction     Humira (adalimumab) Administration:     Prefilled syringe  1. Gather all supplies needed for injection on a clean, flat working surface: medication syringe(s) removed from packaging, alcohol swab, sharps container, etc.  2. Look at the medication label - look for correct medication, correct dose, and check the expiration date  3. Look at the medication - the liquid in the syringe should appear clear and colorless  4. Lay the syringe on a flat surface and allow it to warm up to room temperature for at least 30-45 minutes  5. Select injection site - you can use the front of your thigh or your belly (but not the area 2 inches around your belly button)  6. Prepare injection site - wash your hands and clean the skin at the injection site with an alcohol swab and let it air dry, do not touch the injection site again before  the injection  7. Pull off the needle safety cap, do not remove until immediately prior to injection; turn the syringe so the needle is facing up and hold the syringe at eye level with one hand so you can see the air in the syringe; using your other hand, slowly push the plunger in to push the air out through the needle  8. Pinch the skin - with your hand not holding the syringe pinch up a fold of skin at the injection site using your forefinger and thumb  9. Insert the needle into the fold of skin at about a 45 degree angle - it's best to use a quick dart-like motion  10.Push the plunger down slowly as far as it will go until the syringe is empty, hold the syringe in place for a full 5 seconds  11. Check that the syringe is empty and pull the needle out at the same angle as inserted  12. Dispose of the used syringe immediately in your sharps disposal container, do not attempt to recap the needle prior to disposing  13. If you see any blood at the injection site, press a cotton ball or gauze on the site and maintain pressure until the bleeding stops, do not rub the injection site    Adherence/Missed dose instructions:  If your injection is given more than 3 days after your scheduled injection date - consult your pharmacist for additional instructions on how to adjust your dosing schedule.      Storage, Handling Precautions, & Disposal     ??? Store this medication in the refrigerator.  Do not freeze  ??? If needed, you may store at room temperature for up to 14 days  ??? Store in original packaging, protected from light  ??? Do not shake  ??? Dispose of used syringes/pens in a sharps disposal container            Are there any concerns with adherence? No    Adherence counseling provided? Not needed    CLINICAL MANAGEMENT AND INTERVENTION      Clinical Benefit Assessment:    Do you feel the medicine is effective or helping your condition? Yes    Clinical Benefit counseling provided? Progress note from 09/22/20 shows evidence of clinical benefit    Adverse Effects Assessment:    Are you experiencing any side effects? Yes, patient reports experiencing mild injection site reaction with pens. Reports redness, swelling, and itching at injection site with last 4 pen injections.. Side effect counseling provided: No, patient discussed with provider at appointment on 09/22/20. Planning to try syringes instead. Will plan to follow up at next call with clinical assessment.    Are you experiencing difficulty administering your medicine? No    Quality of Life Assessment:    Quality of Life    Rheumatology  On a scale of 1 - 10 with 1 representing not at all and 10 representing completely - how has your rheumatologic condition affected your:  Daily pain level?: decline to answer  Ability to complete your regular daily tasks (prepare meals, get dressed, etc.)?: decline to answer  Ability to participate in social or family activities?: decline to answer  Oncology  Dermatology               Have you discussed this with your provider? Not needed    Acute Infection Status:    Acute infections noted within Epic:  No active infections  Patient reported infection: None    Therapy  Appropriateness:    Is therapy appropriate? Yes, therapy is appropriate and should be continued    DISEASE/MEDICATION-SPECIFIC INFORMATION      For patients on injectable medications: Patient currently has 0 doses left.  Next injection is scheduled for 10/02/20.    PATIENT SPECIFIC NEEDS     - Does the patient have any physical, cognitive, or cultural barriers? No    - Is the patient high risk? No    - Does the patient require a Care Management Plan? No     - Does the patient require physician intervention or other additional services (i.e. nutrition, smoking cessation, social work)? No      SHIPPING     Specialty Medication(s) to be Shipped:   Inflammatory Disorders: Humira    Other medication(s) to be shipped: No additional medications requested for fill at this time     Changes to insurance: No    Delivery Scheduled: Yes, Expected medication delivery date: 09/29/20.     Medication will be delivered via Same Day Courier to the confirmed prescription address in [facility].    The patient will receive a drug information handout for each medication shipped and additional FDA Medication Guides as required.  Verified that patient has previously received a Conservation officer, historic buildings and a Surveyor, mining.    The patient or caregiver noted above participated in the development of this care plan and knows that they can request review of or adjustments to the care plan at any time.      All of the patient's questions and concerns have been addressed.    Camillo Flaming   [facility] Shared [facility] Specialty Pharmacist

## 2020-10-16 ENCOUNTER — Encounter: Payer: Self-pay | Admitting: Family Medicine

## 2020-10-16 DIAGNOSIS — U071 COVID-19: Principal | ICD-10-CM

## 2020-10-16 MED ORDER — PAXLOVID 300 MG (150 MG X 2)-100 MG TABLET (EUA)
ORAL_TABLET | 0 refills | 0 days | Status: CP
Start: 2020-10-16 — End: ?

## 2020-10-20 NOTE — Unmapped (Signed)
[facility] Clinical Assessment & Refill Coordination Note    Joshua Mccall, DOB: 09-Jul-1960  Phone: [phone] (home)     All above HIPAA information was verified with patient.     Was a Nurse, learning disability used for this call? No    Specialty Medication(s):   Inflammatory Disorders: Humira     Current Outpatient Medications   Medication Sig Dispense Refill   ??? aspirin (ECOTRIN) 81 MG tablet Take 81 mg by mouth.     ??? betamethasone, augmented, (DIPROLENE) 0.05 % cream APPLY TO THE AFFECTED AREA ON ARMS TWICE DAILY AS NEEDED     ??? calcipotriene (DOVONOX) 0.005 % ointment APPLY TWICE DAILY MONDAY-FRIDAY     ??? cholecalciferol, vitamin D3-50 mcg, 2,000 unit,, 50 mcg (2,000 unit) cap Take 1 capsule by mouth.     ??? desonide (DESOWEN) 0.05 % cream apply to affected area twice a day if needed     ??? empty container Misc Use as directed 1 each 2   ??? HUMIRA SYRINGE CITRATE FREE 40 MG/0.4 ML Inject 0.4 mL (40 mg total) under the skin every fourteen (14) days. 6 each 3   ??? hydrocortisone 2.5 % cream Insert into the rectum.     ??? multivitamin (TAB-A-VITE/THERAGRAN) per tablet Take 1 tablet by mouth.     ??? omega 3-dha-epa-fish oil (FISH OIL) 100-160-1,000 mg cap Take 2 capsules by mouth.     ??? PAXLOVID CO-PACK, EUA, (PAXLOVID CO-PACK, EUA,) 300 mg (150 mg x 2)-100 mg tablet See package instructions. 30 tablet 0   ??? tumeric-ging-olive-oreg-capryl 100 mg-150 mg- 50 mg-150 mg cap Take by mouth.     ??? vitamin E-268 mg, 400 UNIT, 268 mg (400 UNIT) capsule Take 1 capsule by mouth.       No current facility-administered medications for this visit.        Changes to medications: [name] is on last day of Paxlovid 5 day course    Allergies   Allergen Reactions   ??? Mercury    ??? Penicillins        Changes to allergies: No    SPECIALTY MEDICATION ADHERENCE     Humira 40mg /0.1mL : 24 days of medicine on hand     Medication Adherence    Patient reported X missed doses in the last month: 1  Specialty Medication: Humira 40mg /0.53mL  Informant: patient          Specialty medication(s) dose(s) confirmed: Regimen is correct and unchanged.     Are there any concerns with adherence? No. Joshua Mccall tested positive for COVID on 10/16/20, same day he was due for Humira dose. Per provider instruction, he skipped that dose of Humira and will plan to resume at next scheduled dose on 10/30/20.    Adherence counseling provided? Not needed    CLINICAL MANAGEMENT AND INTERVENTION      Clinical Benefit Assessment:    Do you feel the medicine is effective or helping your condition? Yes    Clinical Benefit counseling provided? Not needed    Adverse Effects Assessment:    Are you experiencing any side effects? No    Are you experiencing difficulty administering your medicine? No. Joshua Mccall reports Humira syringes went well and there was no injection site reaction. He prefers to stay with syringes.     Quality of Life Assessment:    Quality of Life    Rheumatology  On a scale of 1 - 10 with 1 representing not at all  and 10 representing completely - how has your rheumatologic condition affected your:  Daily pain level?: decline to answer  Ability to complete your regular daily tasks (prepare meals, get dressed, etc.)?: decline to answer  Ability to participate in social or family activities?: decline to answer                 Have you discussed this with your provider? Not needed    Acute Infection Status:    Acute infections noted within Epic:  No active infections  Patient reported infection: Patient test positive for COVID 10/16/20. Started 5 day course of Paxlovid. Skipped Humira dose per provider instruction    Therapy Appropriateness:    Is therapy appropriate? Yes, therapy is appropriate and should be continued    DISEASE/MEDICATION-SPECIFIC INFORMATION      For patients on injectable medications: Patient currently has 1 doses left.  Next injection is scheduled for 10/30/20.    PATIENT SPECIFIC NEEDS     - Does the patient have any physical, cognitive, or cultural barriers? No    - Is the patient high risk? No    - Does the patient require a Care Management Plan? No     - Does the patient require physician intervention or other additional services (i.e. nutrition, smoking cessation, social work)? No      SHIPPING     Specialty Medication(s) to be Shipped:   Inflammatory Disorders: Humira    Other medication(s) to be shipped: No additional medications requested for fill at this time     Changes to insurance: No    Delivery Scheduled: Yes, Expected medication delivery date: 11/03/20.     Medication will be delivered via Same Day Courier to the confirmed prescription address in [facility].    The patient will receive a drug information handout for each medication shipped and additional FDA Medication Guides as required.  Verified that patient has previously received a Conservation officer, historic buildings and a Surveyor, mining.    The patient or caregiver noted above participated in the development of this care plan and knows that they can request review of or adjustments to the care plan at any time.      All of the patient's questions and concerns have been addressed.    Joshua Mccall   [facility] Specialty Pharmacist

## 2020-11-03 MED FILL — HUMIRA SYRINGE CITRATE FREE 40 MG/0.4 ML: SUBCUTANEOUS | 28 days supply | Qty: 2 | Fill #1

## 2020-11-30 NOTE — Unmapped (Signed)
[facility] Refill Coordination Note    Specialty Medication(s) to be Shipped:   Inflammatory Disorders: Humira    Other medication(s) to be shipped: No additional medications requested for fill at this time     Joshua Mccall, DOB: [birth year]  Phone: [phone] (home)       All above HIPAA information was verified with patient.     Was a Nurse, learning disability used for this call? No    Completed refill call assessment today to schedule patient's medication shipment from the [facility] [phone]).  All relevant notes have been reviewed.     Specialty medication(s) and dose(s) confirmed: Regimen is correct and unchanged.   Changes to medications: Joshua Mccall reports no changes at this time.  Changes to insurance: No  New side effects reported not previously addressed with a pharmacist or physician: None reported  Questions for the pharmacist: No    Confirmed patient received a Conservation officer, historic buildings and a Surveyor, mining with first shipment. The patient will receive a drug information handout for each medication shipped and additional FDA Medication Guides as required.       DISEASE/MEDICATION-SPECIFIC INFORMATION        For patients on injectable medications: Patient currently has 0 doses left.  Next injection is scheduled for 9/19.    SPECIALTY MEDICATION ADHERENCE     Medication Adherence    Patient reported X missed doses in the last month: 0  Specialty Medication: Humira  Patient is on additional specialty medications: No  Patient is on more than two specialty medications: No  Any gaps in refill history greater than 2 weeks in the last 3 months: no  Demonstrates understanding of importance of adherence: yes  Informant: patient              Were doses missed due to medication being on hold? No    Humira 40mg /0.40ml: Patient has 0 days of medication on hand     REFERRAL TO PHARMACIST     Referral to the pharmacist: Not needed      [facility]     Shipping address confirmed in Epic. Delivery Scheduled: Yes, Expected medication delivery date: 9/16.     Medication will be delivered via Same Day Courier to the prescription address in Epic WAM.    Joshua Mccall   [facility] Specialty Technician

## 2020-12-08 MED FILL — HUMIRA SYRINGE CITRATE FREE 40 MG/0.4 ML: SUBCUTANEOUS | 28 days supply | Qty: 2 | Fill #2

## 2020-12-13 DIAGNOSIS — L408 Other psoriasis: Secondary | ICD-10-CM | POA: Diagnosis not present

## 2020-12-13 DIAGNOSIS — L570 Actinic keratosis: Secondary | ICD-10-CM | POA: Diagnosis not present

## 2020-12-13 DIAGNOSIS — Z85828 Personal history of other malignant neoplasm of skin: Secondary | ICD-10-CM | POA: Diagnosis not present

## 2020-12-13 DIAGNOSIS — U071 COVID-19: Secondary | ICD-10-CM | POA: Diagnosis not present

## 2020-12-13 DIAGNOSIS — D225 Melanocytic nevi of trunk: Secondary | ICD-10-CM | POA: Diagnosis not present

## 2020-12-13 DIAGNOSIS — X32XXXA Exposure to sunlight, initial encounter: Secondary | ICD-10-CM | POA: Diagnosis not present

## 2020-12-13 DIAGNOSIS — D2262 Melanocytic nevi of left upper limb, including shoulder: Secondary | ICD-10-CM | POA: Diagnosis not present

## 2020-12-29 NOTE — Unmapped (Signed)
[facility] Refill Coordination Note    Specialty Medication(s) to be Shipped:   Inflammatory Disorders: Humira    Other medication(s) to be shipped: No additional medications requested for fill at this time     Joshua Mccall, DOB: [birth year]  Phone: [phone] (home)       All above HIPAA information was verified with patient.     Was a Nurse, learning disability used for this call? No    Completed refill call assessment today to schedule patient's medication shipment from the [facility] [phone]).  All relevant notes have been reviewed.     Specialty medication(s) and dose(s) confirmed: Regimen is correct and unchanged.   Changes to medications: [name] reports no changes at this time.  Changes to insurance: No  New side effects reported not previously addressed with a pharmacist or physician: None reported  Questions for the pharmacist: No    Confirmed patient received a Conservation officer, historic buildings and a Surveyor, mining with first shipment. The patient will receive a drug information handout for each medication shipped and additional FDA Medication Guides as required.       DISEASE/MEDICATION-SPECIFIC INFORMATION        For patients on injectable medications: Patient currently has 0 doses left.  Next injection is scheduled for 10/17.    SPECIALTY MEDICATION ADHERENCE     Medication Adherence    Patient reported X missed doses in the last month: 0  Specialty Medication: Humira  Patient is on additional specialty medications: No  Patient is on more than two specialty medications: No  Any gaps in refill history greater than 2 weeks in the last 3 months: no  Demonstrates understanding of importance of adherence: yes  Informant: patient              Were doses missed due to medication being on hold? No    Humira 40mg /0.68ml: Patient has 0 days of medication on hand    REFERRAL TO PHARMACIST     Referral to the pharmacist: Not needed      [facility]     Shipping address confirmed in Epic. Delivery Scheduled: Yes, Expected medication delivery date: 10/14.     Medication will be delivered via Same Day Courier to the prescription address in Epic WAM.    Olga Millers   [facility] Specialty Technician

## 2021-01-03 ENCOUNTER — Other Ambulatory Visit (HOSPITAL_COMMUNITY): Payer: Self-pay

## 2021-01-03 MED ORDER — INFLUENZA VAC SPLIT QUAD 0.5 ML IM SUSY
PREFILLED_SYRINGE | INTRAMUSCULAR | 0 refills | Status: DC
  Filled 2021-01-03: qty 0.5, 1d supply, fill #0

## 2021-01-05 MED FILL — HUMIRA SYRINGE CITRATE FREE 40 MG/0.4 ML: SUBCUTANEOUS | 28 days supply | Qty: 2 | Fill #3

## 2021-01-09 ENCOUNTER — Ambulatory Visit (INDEPENDENT_AMBULATORY_CARE_PROVIDER_SITE_OTHER): Payer: BC Managed Care – PPO | Admitting: Family Medicine

## 2021-01-09 ENCOUNTER — Other Ambulatory Visit: Payer: Self-pay

## 2021-01-09 VITALS — BP 138/95 | HR 72 | Temp 98.3°F | Ht 72.0 in | Wt 214.0 lb

## 2021-01-09 DIAGNOSIS — Z125 Encounter for screening for malignant neoplasm of prostate: Secondary | ICD-10-CM

## 2021-01-09 DIAGNOSIS — M5126 Other intervertebral disc displacement, lumbar region: Secondary | ICD-10-CM

## 2021-01-09 DIAGNOSIS — M1990 Unspecified osteoarthritis, unspecified site: Secondary | ICD-10-CM | POA: Diagnosis not present

## 2021-01-09 DIAGNOSIS — Z0000 Encounter for general adult medical examination without abnormal findings: Secondary | ICD-10-CM | POA: Diagnosis not present

## 2021-01-09 DIAGNOSIS — I2699 Other pulmonary embolism without acute cor pulmonale: Secondary | ICD-10-CM | POA: Diagnosis not present

## 2021-01-09 DIAGNOSIS — L4050 Arthropathic psoriasis, unspecified: Secondary | ICD-10-CM | POA: Diagnosis not present

## 2021-01-09 DIAGNOSIS — L409 Psoriasis, unspecified: Secondary | ICD-10-CM

## 2021-01-09 DIAGNOSIS — Z13220 Encounter for screening for lipoid disorders: Secondary | ICD-10-CM | POA: Diagnosis not present

## 2021-01-09 DIAGNOSIS — R7989 Other specified abnormal findings of blood chemistry: Secondary | ICD-10-CM | POA: Diagnosis not present

## 2021-01-09 NOTE — Patient Instructions (Addendum)
Decrease Aspirin 81 mg to every other day  Follow up for blood pressure in 6 months

## 2021-01-09 NOTE — Progress Notes (Signed)
Complete physical exam   Patient: Tyler Mcclure   DOB: 1961-01-21   60 y.o. Male  MRN: [id number] Visit Date: 01/09/2021  Today's healthcare provider: [name], MD   No chief complaint on file.  Subjective    Tyler Mcclure is a 60 y.o. male who presents today for a complete physical exam.  He reports consuming a general diet. Gym/ health club routine includes cardio and mod to heavy weightlifting. He generally feels well. He reports sleeping well. He does not have additional problems to discuss today.  HPI  Patient bruises easily would like to cut back on his aspirin.  Past Medical History:  Diagnosis Date   Arthritis    Pulmonary embolus [facility])    Past Surgical History:  Procedure Laterality Date   BICEPS TENDON REPAIR     RESECTION DISTAL CLAVICAL Left 04/27/2015   Procedure: DISTAL CLAVICLE EXCISION;  Surgeon: [name], MD;  Location: [place];  Service: Orthopedics;  Laterality: Left;   SHOULDER ARTHROSCOPY WITH ROTATOR CUFF REPAIR AND SUBACROMIAL DECOMPRESSION Left 04/27/2015   Procedure: LEFT SHOULDER ARTHROSCOPY WITH DEBRIDEMENT, ACROMIOPLASTY, ROTATOR CUFF REPAIR;  Surgeon: [name], MD;  Location: [place];  Service: Orthopedics;  Laterality: Left;   SHOULDER SURGERY     Social History   Socioeconomic History   Marital status: Married    Spouse name: Not on file   Number of children: Not on file   Years of education: Not on file   Highest education level: Not on file  Occupational History   Not on file  Tobacco Use   Smoking status: Never   Smokeless tobacco: Former    Quit date: 04/26/2014  Vaping Use   Vaping Use: Never used  Substance and Sexual Activity   Alcohol use: No    Alcohol/week: 0.0 standard drinks    Comment: OCCASIONALLY   Drug use: No   Sexual activity: Yes    Partners: Female    Comment: Married  Other Topics Concern   Not on file  Social History Narrative   Not on file    Social Determinants of Health   Financial Resource Strain: Not on file  Food Insecurity: Not on file  Transportation Needs: Not on file  Physical Activity: Not on file  Stress: Not on file  Social Connections: Not on file  Intimate Partner Violence: Not on file   Family Status  Relation Name Status   Mother  Deceased at age 72   Father  Deceased at age 84   Sister 39 Alive   Daughter  51   Son  Alive   Sister 2 Deceased   Family History  Problem Relation Age of Onset   Breast cancer Mother    Hyperlipidemia Mother    Pancreatic cancer Father    Healthy Sister    Healthy Daughter    Healthy Son    Allergies  Allergen Reactions   Mercury    Penicillins     Patient Care Team: Jerrol Banana., MD as PCP - General (Family Medicine)   Medications: Outpatient Medications Prior to Visit  Medication Sig   Adalimumab (HUMIRA PEN) 40 MG/0.4ML PNKT Inject into the skin.   aspirin EC 81 MG tablet Take 81 mg by mouth daily.   Cholecalciferol (VITAMIN D) 2000 UNITS CAPS Take 1 capsule by mouth daily.   desonide (DESOWEN) 0.05 % cream apply to affected area twice a day if needed  hydrocortisone (ANUSOL-HC) 2.5 % rectal cream Place 1 application rectally 2 (two) times daily.   influenza vac split quadrivalent PF (FLUARIX) 0.5 ML injection Inject into the muscle.   MULTIPLE VITAMIN PO Take 1 tablet by mouth daily.   Omega-3 Fatty Acids (FISH OIL BURP-LESS) 1200 MG CAPS Take 2 capsules by mouth daily.   vitamin E 400 UNIT capsule Take 1 capsule by mouth daily.   No facility-administered medications prior to visit.    Review of Systems  Constitutional: Negative.   HENT:  Positive for sore throat.   Eyes: Negative.   Respiratory: Negative.    Cardiovascular: Negative.   Gastrointestinal: Negative.   Endocrine: Negative.   Genitourinary: Negative.   Musculoskeletal: Negative.   Skin: Negative.   Allergic/Immunologic: Negative.   Neurological: Negative.    Hematological: Negative.   Psychiatric/Behavioral: Negative.        Objective    BP (!) 149/86 (BP Location: Right Arm, Patient Position: Sitting, Cuff Size: Normal)   Pulse 72   Temp 98.3 F (36.8 C) (Oral)   Ht 6' (1.829 m)   Wt 214 lb (97.1 kg)   SpO2 98%   BMI 29.02 kg/m    Vitals:   01/09/21 0902 01/09/21 0933  BP: (!) 149/86 (!) 138/95  Pulse: 72   Temp: 98.3 F (36.8 C)   TempSrc: Oral   SpO2: 98%   Weight: 214 lb (97.1 kg)   Height: 6' (1.829 m)      Physical Exam Vitals reviewed.  Constitutional:      Appearance: Normal appearance. He is normal weight.  HENT:     Head: Normocephalic and atraumatic.     Right Ear: Tympanic membrane, ear canal and external ear normal.     Left Ear: Tympanic membrane, ear canal and external ear normal.     Nose: Nose normal.     Mouth/Throat:     Mouth: Mucous membranes are moist.     Pharynx: Oropharynx is clear.  Eyes:     Extraocular Movements: Extraocular movements intact.     Conjunctiva/sclera: Conjunctivae normal.     Pupils: Pupils are equal, round, and reactive to light.  Cardiovascular:     Rate and Rhythm: Normal rate and regular rhythm.     Pulses: Normal pulses.     Heart sounds: Normal heart sounds.  Pulmonary:     Effort: Pulmonary effort is normal.     Breath sounds: Normal breath sounds.  Abdominal:     General: Abdomen is flat. Bowel sounds are normal.     Palpations: Abdomen is soft.  Musculoskeletal:     Cervical back: Normal range of motion and neck supple.  Skin:    General: Skin is warm and dry.  Neurological:     General: No focal deficit present.     Mental Status: He is alert and oriented to person, place, and time. Mental status is at baseline.  Psychiatric:        Mood and Affect: Mood normal.        Behavior: Behavior normal.        Thought Content: Thought content normal.        Judgment: Judgment normal.      Last depression screening scores PHQ 2/9 Scores 01/09/2021  07/05/2020 01/05/2020  PHQ - 2 Score 0 0 0  PHQ- 9 Score 0 0 0   Last fall risk screening Fall Risk  01/09/2021  Falls in the past year? 0  Number falls in past  yr: 0  Injury with Fall? 0  Follow up -   Last Audit-C alcohol use screening Alcohol Use Disorder Test (AUDIT) 01/09/2021  1. How often do you have a drink containing alcohol? 3  2. How many drinks containing alcohol do you have on a typical day when you are drinking? 1  3. How often do you have six or more drinks on one occasion? 1  AUDIT-C Score 5  4. How often during the last year have you found that you were not able to stop drinking once you had started? -  5. How often during the last year have you failed to do what was normally expected from you because of drinking? -  6. How often during the last year have you needed a first drink in the morning to get yourself going after a heavy drinking session? -  7. How often during the last year have you had a feeling of guilt of remorse after drinking? -  8. How often during the last year have you been unable to remember what happened the night before because you had been drinking? -  9. Have you or someone else been injured as a result of your drinking? -  10. Has a relative or friend or a doctor or another health worker been concerned about your drinking or suggested you cut down? -  Alcohol Use Disorder Identification Test Final Score (AUDIT) -  Alcohol Brief Interventions/Follow-up -   A score of 3 or more in women, and 4 or more in men indicates increased risk for alcohol abuse, EXCEPT if all of the points are from question 1   No results found for any visits on 01/09/21.  Assessment & Plan    Routine Health Maintenance and Physical Exam  Exercise Activities and Dietary recommendations  Goals   None     Immunization History  Administered Date(s) Administered   Influenza,inj,Quad PF,6+ Mos 01/03/2021   Influenza-Unspecified 12/29/2019   PFIZER(Purple  Top)SARS-COV-2 Vaccination 06/28/2019, 07/28/2019, 02/03/2020   Pneumococcal Conjugate-13 05/15/2020   Td 01/04/2019   Tdap 11/24/2008    Health Maintenance  Topic Date Due   HIV Screening  Never done   Zoster Vaccines- Shingrix (1 of 2) Never done   COVID-19 Vaccine (4 - Booster for Pfizer series) 04/27/2020   COLONOSCOPY (Pts 45-30yr Insurance coverage will need to be confirmed)  02/23/2022   TETANUS/TDAP  01/03/2029   INFLUENZA VACCINE  Completed   Hepatitis C Screening  Completed   HPV VACCINES  Aged Out    Discussed health benefits of physical activity, and encouraged him to engage in regular exercise appropriate for his age and condition.  1. Annual physical exam  - CBC with Differential/Platelet - Comprehensive metabolic panel - Lipid Panel With LDL/HDL Ratio - TSH  2. Prostate cancer screening  - PSA  3. Psoriatic arthritis ([place] Humira  4. Left pulmonary embolus (HCC) Cut back aspirin every other day  5. Psoriasis   6. Herniated lumbar intervertebral disc    No follow-ups on file.     I, RWilhemena Durie MD, have reviewed all documentation for this visit. The documentation on 01/15/21 for the exam, diagnosis, procedures, and orders are all accurate and complete.    Cisco Kindt GCranford Mon MD  [phone](phone) [phone](fax)  [place]

## 2021-01-10 LAB — CBC WITH DIFFERENTIAL/PLATELET
Basophils Absolute: 0.1 10*3/uL (ref 0.0–0.2)
Basos: 1 %
EOS (ABSOLUTE): 0.2 10*3/uL (ref 0.0–0.4)
Eos: 3 %
Hematocrit: 49.1 % (ref 37.5–51.0)
Hemoglobin: 17 g/dL (ref 13.0–17.7)
Immature Grans (Abs): 0 10*3/uL (ref 0.0–0.1)
Immature Granulocytes: 1 %
Lymphocytes Absolute: 2.3 10*3/uL (ref 0.7–3.1)
Lymphs: 28 %
MCH: 30.9 pg (ref 26.6–33.0)
MCHC: 34.6 g/dL (ref 31.5–35.7)
MCV: 89 fL (ref 79–97)
Monocytes Absolute: 0.8 10*3/uL (ref 0.1–0.9)
Monocytes: 9 %
Neutrophils Absolute: 4.9 10*3/uL (ref 1.4–7.0)
Neutrophils: 58 %
Platelets: 210 10*3/uL (ref 150–450)
RBC: 5.5 x10E6/uL (ref 4.14–5.80)
RDW: 11.5 % — ABNORMAL LOW (ref 11.6–15.4)
WBC: 8.3 10*3/uL (ref 3.4–10.8)

## 2021-01-10 LAB — LIPID PANEL WITH LDL/HDL RATIO
Cholesterol, Total: 214 mg/dL — ABNORMAL HIGH (ref 100–199)
HDL: 52 mg/dL (ref >39)
LDL Chol Calc (NIH): 115 mg/dL — ABNORMAL HIGH (ref 0–99)
LDL/HDL Ratio: 2.2 ratio (ref 0.0–3.6)
Triglycerides: 269 mg/dL — ABNORMAL HIGH (ref 0–149)
VLDL Cholesterol Cal: 47 mg/dL — ABNORMAL HIGH (ref 5–40)

## 2021-01-10 LAB — COMPREHENSIVE METABOLIC PANEL
ALT: 33 IU/L (ref 0–44)
AST: 20 IU/L (ref 0–40)
Albumin/Globulin Ratio: 1.7 (ref 1.2–2.2)
Albumin: 5.2 g/dL — ABNORMAL HIGH (ref 3.8–4.9)
Alkaline Phosphatase: 55 IU/L (ref 44–121)
BUN/Creatinine Ratio: 15 (ref 9–20)
BUN: 14 mg/dL (ref 6–24)
Bilirubin Total: 1.3 mg/dL — ABNORMAL HIGH (ref 0.0–1.2)
CO2: 24 mmol/L (ref 20–29)
Calcium: 9.8 mg/dL (ref 8.7–10.2)
Chloride: 100 mmol/L (ref 96–106)
Creatinine, Ser: 0.92 mg/dL (ref 0.76–1.27)
Globulin, Total: 3 g/dL (ref 1.5–4.5)
Glucose: 103 mg/dL — ABNORMAL HIGH (ref 70–99)
Potassium: 4.5 mmol/L (ref 3.5–5.2)
Sodium: 140 mmol/L (ref 134–144)
Total Protein: 8.2 g/dL (ref 6.0–8.5)
eGFR: 96 mL/min/{1.73_m2} (ref >59)

## 2021-01-10 LAB — PSA: Prostate Specific Ag, Serum: 0.3 ng/mL (ref 0.0–4.0)

## 2021-01-10 LAB — TSH: TSH: 4.73 u[IU]/mL — ABNORMAL HIGH (ref 0.450–4.500)

## 2021-01-30 NOTE — Unmapped (Signed)
[facility] Refill Coordination Note    Specialty Medication(s) to be Shipped:   Inflammatory Disorders: Humira    Other medication(s) to be shipped: No additional medications requested for fill at this time     Joshua Mccall, DOB: 1960/12/18  Phone: [phone] (home)       All above HIPAA information was verified with patient.     Was a Nurse, learning disability used for this call? No    Completed refill call assessment today to schedule patient's medication shipment from the [facility] [phone]).  All relevant notes have been reviewed.     Specialty medication(s) and dose(s) confirmed: Regimen is correct and unchanged.   Changes to medications: [place] reports no changes at this time.  Changes to insurance: No  New side effects reported not previously addressed with a pharmacist or physician: None reported  Questions for the pharmacist: No    Confirmed patient received a Conservation officer, historic buildings and a Surveyor, mining with first shipment. The patient will receive a drug information handout for each medication shipped and additional FDA Medication Guides as required.       DISEASE/MEDICATION-SPECIFIC INFORMATION        For patients on injectable medications: Patient currently has 0 doses left.  Next injection is scheduled for 02/04/21.    SPECIALTY MEDICATION ADHERENCE     Medication Adherence    Patient reported X missed doses in the last month: 0  Specialty Medication: Humira (CF) 40mg /0.73ml  Patient is on additional specialty medications: No  Patient is on more than two specialty medications: No              Were doses missed due to medication being on hold? No    Humira (CF) 40mg / mg/ml: 0.21ml  0 days of medicine on hand       REFERRAL TO PHARMACIST     Referral to the pharmacist: Not needed      [facility]     Shipping address confirmed in Epic.     Delivery Scheduled: Yes, Expected medication delivery date: 02/01/21.     Medication will be delivered via Same Day Courier to the prescription address in Epic WAM.    Nancy Nordmann [facility] Specialty Technician

## 2021-02-01 MED FILL — HUMIRA SYRINGE CITRATE FREE 40 MG/0.4 ML: SUBCUTANEOUS | 28 days supply | Qty: 2 | Fill #4

## 2021-02-22 NOTE — Unmapped (Signed)
[facility] Refill Coordination Note    Specialty Medication(s) to be Shipped:   Inflammatory Disorders: Humira    Other medication(s) to be shipped: No additional medications requested for fill at this time     Joshua Mccall, DOB: Nov 24, 1960  Phone: [phone] (home)       All above HIPAA information was verified with patient.     Was a Nurse, learning disability used for this call? No    Completed refill call assessment today to schedule patient's medication shipment from the [facility] [phone]).  All relevant notes have been reviewed.     Specialty medication(s) and dose(s) confirmed: Regimen is correct and unchanged.   Changes to medications: [place] reports no changes at this time.  Changes to insurance: No  New side effects reported not previously addressed with a pharmacist or physician: None reported  Questions for the pharmacist: No    Confirmed patient received a Conservation officer, historic buildings and a Surveyor, mining with first shipment. The patient will receive a drug information handout for each medication shipped and additional FDA Medication Guides as required.       DISEASE/MEDICATION-SPECIFIC INFORMATION        For patients on injectable medications: Patient currently has 0 doses left.  Next injection is scheduled for 12/11.    SPECIALTY MEDICATION ADHERENCE     Medication Adherence    Patient reported X missed doses in the last month: 0  Specialty Medication: Humira  Patient is on additional specialty medications: No  Patient is on more than two specialty medications: No  Any gaps in refill history greater than 2 weeks in the last 3 months: no  Demonstrates understanding of importance of adherence: yes  Informant: patient              Were doses missed due to medication being on hold? No    Humira 40mg /0.67ml: Patient has 0 days of medication on hand    REFERRAL TO PHARMACIST     Referral to the pharmacist: Not needed      [facility]     Shipping address confirmed in Epic. Delivery Scheduled: Yes, Expected medication delivery date: 12/8.     Medication will be delivered via Same Day Courier to the prescription address in Epic WAM.    Olga Millers   [facility] Specialty Technician

## 2021-02-28 DIAGNOSIS — L4050 Arthropathic psoriasis, unspecified: Principal | ICD-10-CM

## 2021-03-01 MED FILL — HUMIRA SYRINGE CITRATE FREE 40 MG/0.4 ML: SUBCUTANEOUS | 28 days supply | Qty: 2 | Fill #5

## 2021-03-07 DIAGNOSIS — Z0000 Encounter for general adult medical examination without abnormal findings: Secondary | ICD-10-CM | POA: Diagnosis not present

## 2021-03-07 DIAGNOSIS — E782 Mixed hyperlipidemia: Secondary | ICD-10-CM | POA: Diagnosis not present

## 2021-03-07 DIAGNOSIS — R7301 Impaired fasting glucose: Secondary | ICD-10-CM | POA: Diagnosis not present

## 2021-03-07 DIAGNOSIS — Z1329 Encounter for screening for other suspected endocrine disorder: Secondary | ICD-10-CM | POA: Diagnosis not present

## 2021-03-07 DIAGNOSIS — Z125 Encounter for screening for malignant neoplasm of prostate: Secondary | ICD-10-CM | POA: Diagnosis not present

## 2021-03-07 DIAGNOSIS — E039 Hypothyroidism, unspecified: Secondary | ICD-10-CM | POA: Diagnosis not present

## 2021-03-08 DIAGNOSIS — E782 Mixed hyperlipidemia: Secondary | ICD-10-CM | POA: Diagnosis not present

## 2021-03-08 DIAGNOSIS — E039 Hypothyroidism, unspecified: Secondary | ICD-10-CM | POA: Diagnosis not present

## 2021-03-08 DIAGNOSIS — R7301 Impaired fasting glucose: Secondary | ICD-10-CM | POA: Diagnosis not present

## 2021-03-08 DIAGNOSIS — Z0000 Encounter for general adult medical examination without abnormal findings: Secondary | ICD-10-CM | POA: Diagnosis not present

## 2021-03-08 DIAGNOSIS — Z23 Encounter for immunization: Secondary | ICD-10-CM | POA: Diagnosis not present

## 2021-03-08 DIAGNOSIS — I10 Essential (primary) hypertension: Secondary | ICD-10-CM | POA: Diagnosis not present

## 2021-03-12 ENCOUNTER — Ambulatory Visit
Admit: 2021-03-12 | Discharge: 2021-03-13 | Payer: PRIVATE HEALTH INSURANCE | Attending: Student in an Organized Health Care Education/Training Program | Primary: Student in an Organized Health Care Education/Training Program

## 2021-03-12 DIAGNOSIS — Z79620 Long term (current) use of immunosuppressive biologic: Secondary | ICD-10-CM | POA: Diagnosis not present

## 2021-03-12 DIAGNOSIS — Z6829 Body mass index (BMI) 29.0-29.9, adult: Secondary | ICD-10-CM | POA: Diagnosis not present

## 2021-03-12 DIAGNOSIS — L4050 Arthropathic psoriasis, unspecified: Secondary | ICD-10-CM | POA: Diagnosis not present

## 2021-03-12 NOTE — Unmapped (Signed)
Rheumatology Clinic Follow-up Note     Assessment/Plan:    Joshua Mccall is a 60 y.o.  male with a PMH of provoked PE, inverse psoriasis, and psoriatic arthritis with [place] evidence of tenosynovitis of the L 3rd toe and possible L sacroiliitis. He also has significant MSK symptoms related to prior injuries including rotator cuff tears requiring surgery, R biceps tendon tear requiring surgery, bilateral wrist fractures, ? knee injury. He comes into clinic today for follow up of PsA. He started Humira around 03/2020.    PsA - Inverse Psoriasis: He was started on Humira around beginning of 2022 and has had significant improvement. Joints remain controlled today by exam although he has had some increasing aching with the rain/weather changes in past couple weeks, seems to be more pronounced around d10 after Humira. After long discussion of options (monitoring, increasing humira/checking abs, starting MTX), we settled on monitoring for now. He is to message me if this worsens. If it does, would consider anti drug antibodies and if not present would increase to q7d over switching to alternative.  - Cont Humira  - Reviewed care everywhere labs from 01/09/21: CBC w/ diff w/o cytopenias, CMP with nl Cr and LFTs    Return in about 6 months (around 09/10/2021).    Patient was seen and discussed with attending physician, Dr. Carie Caddy, MD   Rheumatology Fellow, PGY5    Primary Care Provider: PCP Information Unavailable    HPI:  Joshua Mccall is a 60 y.o.  male with a PMH of provoked PE, inverse psoriasis, and psoriatic arthritis with [place] evidence of tenosynovitis of the L 3rd toe and possible L sacroiliitis. He also has significant MSK symptoms related to prior injuries including rotator cuff tears requiring surgery, R biceps tendon tear requiring surgery, bilateral wrist fractures, ? knee injury. He comes into clinic today for follow up of PsA.    Today  Patient reports he overall is doing well and has not had any issues since his last visit.  His main complaint is he is getting worsening leg cramps in his posterior left leg predominantly.  He notes that he has had multiple herniated disks dating back many years that sometimes presents this way.  He also notes that he has had a lot of trauma to the area and previous x-rays have noted calcinosis of some of his hamstring muscles, he wonders if they are not related to his spasms.  Otherwise feels that he has not had any of the swelling or pain/stiffness that he had before starting the Humira.  Still feels like it has been very helpful.  Past couple weeks though he has noticed that around day 10 after his injection he has been a little more crampy and had some more pain although these are days that the weather has changed such as rain or dropping to be cold, he is not sure if that is not what is causing it.  Tolerating the injections well, he was having welts with the pen but now is much better than he is using the syringes.  His last injection was Monday.    History  Joshua Mccall has had inverse psoriasis since 2018-2019, with symptoms primarily in his groin region but more recently spreading to his wrist.  He was seen a dermatologist and had used topical corticosteroids which previously controlled his psoriasis. He notes he has chronic joint pain related to multiple injuries dating back as early as middle school. His prior injuries  include bilateral rotator cuff disease with bilateral repairs.  He also had a right biceps tendon rupture with repair.  He also fractured both wrists. However, during 2021 he had worsening left third toe pain with a sensation of a marble under the foot and swelling with brownish-red discoloration, and tenosynovitis and arthritis seen on ultrasound.   In addition to his left third toe pain and swelling he also noted new right medial elbow pain and tenderness in 2021. He also had right lateral/posterior hip/buttocks pain that is worse in the morning and improves throughout the day, with possible left sarcoiliitis on xray.    Review of Systems:  Positive findings noted above, otherwise a 14 point review of systems was reviewed and negative    Past Medical, Surgical, Family and Social History reviewed and updated per EMR     Allergies:  Mercury and Penicillins    Medications:     Current Outpatient Medications:   ???  aspirin (ECOTRIN) 81 MG tablet, Take 81 mg by mouth., Disp: , Rfl:   ???  betamethasone, augmented, (DIPROLENE) 0.05 % cream, APPLY TO THE AFFECTED AREA ON ARMS TWICE DAILY AS NEEDED, Disp: , Rfl:   ???  calcipotriene (DOVONOX) 0.005 % ointment, APPLY TWICE DAILY MONDAY-FRIDAY, Disp: , Rfl:   ???  cholecalciferol, vitamin D3-50 mcg, 2,000 unit,, 50 mcg (2,000 unit) cap, Take 1 capsule by mouth., Disp: , Rfl:   ???  desonide (DESOWEN) 0.05 % cream, apply to affected area twice a day if needed, Disp: , Rfl:   ???  empty container Misc, Use as directed, Disp: 1 each, Rfl: 2  ???  HUMIRA SYRINGE CITRATE FREE 40 MG/0.4 ML, Inject 0.4 mL (40 mg total) under the skin every fourteen (14) days., Disp: 6 each, Rfl: 3  ???  hydrocortisone 2.5 % cream, Insert into the rectum., Disp: , Rfl:   ???  multivitamin (TAB-A-VITE/THERAGRAN) per tablet, Take 1 tablet by mouth., Disp: , Rfl:   ???  omega 3-dha-epa-fish oil (FISH OIL) 100-160-1,000 mg cap, Take 2 capsules by mouth., Disp: , Rfl:   ???  PAXLOVID CO-PACK, EUA, (PAXLOVID CO-PACK, EUA,) 300 mg (150 mg x 2)-100 mg tablet, See package instructions., Disp: 30 tablet, Rfl: 0  ???  tumeric-ging-olive-oreg-capryl 100 mg-150 mg- 50 mg-150 mg cap, Take by mouth., Disp: , Rfl:   ???  vitamin E-268 mg, 400 UNIT, 268 mg (400 UNIT) capsule, Take 1 capsule by mouth., Disp: , Rfl:       Objective   Vitals:    03/12/21 1518   BP: 156/104   BP Site: L Arm   BP Position: Sitting   BP Cuff Size: Medium   Pulse: 91   Temp: 36.2 ??C (97.1 ??F)   TempSrc: Temporal   Weight: 98.7 kg (217 lb 9.6 oz)   Height: 182.9 cm (6')       Physical Exam  General: well appearing, no acute distress  Eyes: EOMI, normal conjunctivae   ENT: MMM.  Oropharynx without any erythema or exudate.  No oral or nasal ulcers.  Neck: supple. No cervical lymphadenopathy  Cardiovascular: Regular rate and rhythm. No murmurs, rubs or gallops.   Pulmonary: Clear to auscultation bilaterally. Normal work of breathing.  Skin: small pink plaque on L wrist. Otherwise no rash, lesions, breakdown on a clothed exam. No purpura or petechiae. No digital ulcers.   Extremities: Warm and well perfused, no cyanosis, clubbing or edema  Musculoskeletal: No synovitis or tenderness to palpation in the hands, wrists, elbows,  shoulders, knees, ankles, or feet. Slightly reduced ROM of bilateral wrists and elbows. Shoulders with slightly limited abduction to ~80 degrees. Full ROM throughout otherwise.   Neurologic: Cranial nerves grossly intact, strength 5/5 throughout.  Normal sensation  Psychiatric: Normal mood and affect.

## 2021-03-28 NOTE — Unmapped (Signed)
[facility] Clinical Assessment & Refill Coordination Note    Joshua Mccall, DOB: 1961-03-19  Phone: [phone] (home)     All above HIPAA information was verified with patient.     Was a Nurse, learning disability used for this call? No    Specialty Medication(s):   Inflammatory Disorders: Humira     Current Outpatient Medications   Medication Sig Dispense Refill   ??? aspirin (ECOTRIN) 81 MG tablet Take 81 mg by mouth.     ??? betamethasone, augmented, (DIPROLENE) 0.05 % cream APPLY TO THE AFFECTED AREA ON ARMS TWICE DAILY AS NEEDED     ??? calcipotriene (DOVONOX) 0.005 % ointment APPLY TWICE DAILY MONDAY-FRIDAY     ??? cholecalciferol, vitamin D3-50 mcg, 2,000 unit,, 50 mcg (2,000 unit) cap Take 1 capsule by mouth.     ??? desonide (DESOWEN) 0.05 % cream apply to affected area twice a day if needed     ??? empty container Misc Use as directed 1 each 2   ??? HUMIRA SYRINGE CITRATE FREE 40 MG/0.4 ML Inject 0.4 mL (40 mg total) under the skin every fourteen (14) days. 6 each 3   ??? hydrocortisone 2.5 % cream Insert into the rectum.     ??? multivitamin (TAB-A-VITE/THERAGRAN) per tablet Take 1 tablet by mouth.     ??? omega 3-dha-epa-fish oil (FISH OIL) 100-160-1,000 mg cap Take 2 capsules by mouth.     ??? PAXLOVID CO-PACK, EUA, (PAXLOVID CO-PACK, EUA,) 300 mg (150 mg x 2)-100 mg tablet See package instructions. 30 tablet 0   ??? tumeric-ging-olive-oreg-capryl 100 mg-150 mg- 50 mg-150 mg cap Take by mouth.     ??? vitamin E-268 mg, 400 UNIT, 268 mg (400 UNIT) capsule Take 1 capsule by mouth.       No current facility-administered medications for this visit.        Changes to medications: Joshua Mccall reports no changes at this time.    Allergies   Allergen Reactions   ??? Mercury    ??? Penicillins        Changes to allergies: No    SPECIALTY MEDICATION ADHERENCE       Medication Adherence    Patient reported X missed doses in the last month: 0  Specialty Medication: Humira q14d  Patient is on additional specialty medications: No  Informant: patient          Specialty medication(s) dose(s) confirmed: Regimen is correct and unchanged.     Are there any concerns with adherence? No    Adherence counseling provided? Not needed    CLINICAL MANAGEMENT AND INTERVENTION      Clinical Benefit Assessment:    Do you feel the medicine is effective or helping your condition? Yes    Clinical Benefit counseling provided? Not needed    Adverse Effects Assessment:    Are you experiencing any side effects? No    Are you experiencing difficulty administering your medicine? No    Quality of Life Assessment:    Quality of Life    Rheumatology  1. What impact has your specialty medication had on the reduction of your daily pain level?: Some  2. What impact has your specialty medication had on your ability to complete daily tasks (prepare meals, get dressed, etc...)?: Some  Oncology  Dermatology  Cystic Fibrosis              Have you discussed this with your provider? Not needed    Acute Infection Status:  Acute infections noted within Epic:  No active infections  Patient reported infection: None    Therapy Appropriateness:    Is therapy appropriate and patient progressing towards therapeutic goals? Yes, therapy is appropriate and should be continued    DISEASE/MEDICATION-SPECIFIC INFORMATION      For patients on injectable medications: Patient currently has 0 doses left.  Next injection is scheduled for 1/8.    PATIENT SPECIFIC NEEDS     - Does the patient have any physical, cognitive, or cultural barriers? No    - Is the patient high risk? No    - Does the patient require a Care Management Plan? No     SOCIAL DETERMINANTS OF HEALTH     At the [facility], we have learned that life circumstances - like trouble affording food, housing, utilities, or transportation can affect the health of many of our patients.   That is why we wanted to ask: are you currently experiencing any life circumstances that are negatively impacting your health and/or quality of life? Patient declined to answer    Social Determinants of Health     Food Insecurity: Not on file   Tobacco Use: Low Risk    ??? Smoking Tobacco Use: Never   ??? Smokeless Tobacco Use: Never   ??? Passive Exposure: Not on file   Transportation Needs: Not on file   Alcohol Use: Not on file   Housing/Utilities: Not on file   Substance Use: Not on file   Financial Resource Strain: Not on file   Physical Activity: Not on file   Health Literacy: Not on file   Stress: Not on file   Intimate Partner Violence: Not on file   Depression: Not on file   Social Connections: Not on file       Would you be willing to receive help with any of the needs that you have identified today? Not applicable       SHIPPING     Specialty Medication(s) to be Shipped:   Inflammatory Disorders: Humira    Other medication(s) to be shipped: No additional medications requested for fill at this time     Changes to insurance: No    Delivery Scheduled: Yes, Expected medication delivery date: 03/30/21.     Medication will be delivered via Same Day Courier to the confirmed prescription address in [facility].    The patient will receive a drug information handout for each medication shipped and additional FDA Medication Guides as required.  Verified that patient has previously received a Conservation officer, historic buildings and a Surveyor, mining.    The patient or caregiver noted above participated in the development of this care plan and knows that they can request review of or adjustments to the care plan at any time.      All of the patient's questions and concerns have been addressed.    Joshua Mccall   [facility] Specialty Pharmacist

## 2021-03-30 MED FILL — HUMIRA SYRINGE CITRATE FREE 40 MG/0.4 ML: SUBCUTANEOUS | 28 days supply | Qty: 2 | Fill #6

## 2021-04-19 NOTE — Unmapped (Signed)
[facility] Refill Coordination Note    Specialty Medication(s) to be Shipped:   Inflammatory Disorders: Humira    Other medication(s) to be shipped: No additional medications requested for fill at this time     Joshua Mccall, DOB: Jul 22, 1960  Phone: [phone] (home)       All above HIPAA information was verified with patient.     Was a Nurse, learning disability used for this call? No    Completed refill call assessment today to schedule patient's medication shipment from the [facility] [phone]).  All relevant notes have been reviewed.     Specialty medication(s) and dose(s) confirmed: Regimen is correct and unchanged.   Changes to medications: Joshua Mccall reports no changes at this time.  Changes to insurance: No  New side effects reported not previously addressed with a pharmacist or physician: None reported  Questions for the pharmacist: No    Confirmed patient received a Conservation officer, historic buildings and a Surveyor, mining with first shipment. The patient will receive a drug information handout for each medication shipped and additional FDA Medication Guides as required.       DISEASE/MEDICATION-SPECIFIC INFORMATION        For patients on injectable medications: Patient currently has 0 doses left.  Next injection is scheduled for 2/5.    SPECIALTY MEDICATION ADHERENCE     Medication Adherence    Patient reported X missed doses in the last month: 0  Specialty Medication: Humira  Patient is on additional specialty medications: No  Patient is on more than two specialty medications: No  Any gaps in refill history greater than 2 weeks in the last 3 months: no  Demonstrates understanding of importance of adherence: yes  Informant: patient              Were doses missed due to medication being on hold? No    Humira 40mg /0.44ml: Patient has 0 days of medication on hand     REFERRAL TO PHARMACIST     Referral to the pharmacist: Not needed      [facility]     Shipping address confirmed in Epic. Delivery Scheduled: Yes, Expected medication delivery date: 2.2.     Medication will be delivered via Same Day Courier to the prescription address in Epic WAM.    Joshua Mccall   [facility] Specialty Technician

## 2021-04-26 MED FILL — HUMIRA SYRINGE CITRATE FREE 40 MG/0.4 ML: SUBCUTANEOUS | 28 days supply | Qty: 2 | Fill #7

## 2021-05-18 NOTE — Unmapped (Signed)
[facility] Refill Coordination Note    Specialty Medication(s) to be Shipped:   Inflammatory Disorders: Humira    Other medication(s) to be shipped: No additional medications requested for fill at this time     Joshua Mccall, DOB: 08/24/1960  Phone: [phone] (home)       All above HIPAA information was verified with patient.     Was a Nurse, learning disability used for this call? No    Completed refill call assessment today to schedule patient's medication shipment from the [facility] [phone]).  All relevant notes have been reviewed.     Specialty medication(s) and dose(s) confirmed: Regimen is correct and unchanged.   Changes to medications: Joshua Mccall reports no changes at this time.  Changes to insurance: No  New side effects reported not previously addressed with a pharmacist or physician: None reported  Questions for the pharmacist: No    Confirmed patient received a Conservation officer, historic buildings and a Surveyor, mining with first shipment. The patient will receive a drug information handout for each medication shipped and additional FDA Medication Guides as required.       DISEASE/MEDICATION-SPECIFIC INFORMATION        For patients on injectable medications: Patient currently has 0 doses left.  Next injection is scheduled for 3/6.    SPECIALTY MEDICATION ADHERENCE     Medication Adherence    Patient reported X missed doses in the last month: 0  Specialty Medication: Humira cf 40mg /0.26ml              Were doses missed due to medication being on hold? No        REFERRAL TO PHARMACIST     Referral to the pharmacist: Not needed      [facility]     Shipping address confirmed in Epic.     Delivery Scheduled: Yes, Expected medication delivery date: 3/2.     Medication will be delivered via Same Day Courier to the prescription address in Epic WAM.    Westley Gambles   [facility] Specialty Technician

## 2021-05-24 MED FILL — HUMIRA SYRINGE CITRATE FREE 40 MG/0.4 ML: SUBCUTANEOUS | 28 days supply | Qty: 2 | Fill #8

## 2021-06-12 NOTE — Unmapped (Signed)
[facility] Refill Coordination Note    Joshua Mccall, DOB: [birth year]  Phone: [phone] (home)       All above HIPAA information was verified with patient.     Specialty Rx Medication Refill Questionnaire 06/11/2021   Which Medications would you like refilled and shipped? Humira   0 on hand   Please list all current allergies: penicillin and mercury   Have you missed any doses in the last 30 days? No   Have you had any changes to your medication(s) since your last refill? No   How many days remaining of each medication do you have at home? 0   Have you experienced any side effects in the last 30 days? No   Please enter the full address (street address, city, state, zip code) where you would like your medication(s) to be delivered to. [address] , [place], [place] [address]   Please specify on which day you would like your medication(s) to arrive. Note: if you need your medication(s) within 3 days, please call the pharmacy to schedule your order at [phone]  06/21/2021   Has your insurance changed since your last refill? No   Would you like a pharmacist to call you to discuss your medication(s)? No   Do you require a signature for your package? (Note: if we are billing Medicare Part B or your order contains a controlled substance, we will require a signature) No         Completed refill call assessment today to schedule patient's medication shipment from the [facility] [phone]).  All relevant notes have been reviewed.       Confirmed patient received a Conservation officer, historic buildings and a Surveyor, mining with first shipment. The patient will receive a drug information handout for each medication shipped and additional FDA Medication Guides as required.         REFERRAL TO PHARMACIST     Referral to the pharmacist: Not needed      [facility]     Shipping address confirmed in Epic.     Delivery Scheduled: Yes, Expected medication delivery date: 3/30.     Medication will be delivered via Same Day Courier to the prescription address in Epic WAM.    Olga Millers   [facility] Specialty Technician

## 2021-06-21 MED FILL — HUMIRA SYRINGE CITRATE FREE 40 MG/0.4 ML: SUBCUTANEOUS | 28 days supply | Qty: 2 | Fill #9

## 2021-07-11 NOTE — Progress Notes (Signed)
?  ? ? ?Established patient visit ? ?I,[name],acting as a scribe for [name], MD.,have documented all relevant documentation on the behalf of [name], MD,as directed by  [name], MD while in the presence of [name], MD. ? ? ?Patient: Tyler Mcclure   DOB: 03/29/1960   61 y.o. Male  MRN: [id number] ?Visit Date: 07/12/2021 ? ?Today's healthcare provider: [name], MD  ? ?Chief Complaint  ?Patient presents with  ? Follow-up  ? ?Subjective  ?  ?HPI  ?Patient is here for 6 month follow up.  He feels well. ?He has some mild chronic neuropathy. ?He is taking medications as prescribed and try to get diet and exercise him to keep his weight down. ? ?Medications: ?Outpatient Medications Prior to Visit  ?Medication Sig  ? Adalimumab (HUMIRA PEN) 40 MG/0.4ML PNKT Inject into the skin.  ? aspirin EC 81 MG tablet Take 81 mg by mouth every other day.  ? Cholecalciferol (VITAMIN D) 2000 UNITS CAPS Take 1 capsule by mouth daily.  ? influenza vac split quadrivalent PF (FLUARIX) 0.5 ML injection Inject into the muscle.  ? MULTIPLE VITAMIN PO Take 1 tablet by mouth daily.  ? Omega-3 Fatty Acids (FISH OIL BURP-LESS) 1200 MG CAPS Take 2 capsules by mouth daily.  ? vitamin E 400 UNIT capsule Take 1 capsule by mouth daily.  ? [DISCONTINUED] desonide (DESOWEN) 0.05 % cream apply to affected area twice a day if needed  ? [DISCONTINUED] hydrocortisone (ANUSOL-HC) 2.5 % rectal cream Place 1 application rectally 2 (two) times daily.  ? ?No facility-administered medications prior to visit.  ? ? ?Review of Systems  ?Constitutional:  Negative for appetite change, chills and fever.  ?Respiratory:  Negative for chest tightness, shortness of breath and wheezing.   ?Cardiovascular:  Negative for chest pain and palpitations.  ?Gastrointestinal:  Negative for abdominal pain, nausea and vomiting.  ? ?Last metabolic panel ?Lab Results  ?Component Value Date  ? GLUCOSE 103 (H) 01/09/2021  ? NA 140  01/09/2021  ? K 4.5 01/09/2021  ? CL 100 01/09/2021  ? CO2 24 01/09/2021  ? BUN 14 01/09/2021  ? CREATININE 0.92 01/09/2021  ? EGFR 96 01/09/2021  ? CALCIUM 9.8 01/09/2021  ? PROT 8.2 01/09/2021  ? ALBUMIN 5.2 (H) 01/09/2021  ? LABGLOB 3.0 01/09/2021  ? AGRATIO 1.7 01/09/2021  ? BILITOT 1.3 (H) 01/09/2021  ? ALKPHOS 55 01/09/2021  ? AST 20 01/09/2021  ? ALT 33 01/09/2021  ? ANIONGAP 10 06/06/2017  ? ?  ?  Objective  ?  ?There were no vitals taken for this visit. ?BP Readings from Last 3 Encounters:  ?07/12/21 120/82  ?01/09/21 (!) 138/95  ?07/05/20 129/87  ? ?Wt Readings from Last 3 Encounters:  ?07/12/21 215 lb (97.5 kg)  ?01/09/21 214 lb (97.1 kg)  ?07/05/20 208 lb (94.3 kg)  ? ?  ? ?Physical Exam ?Vitals reviewed.  ?Constitutional:   ?   General: He is not in acute distress. ?   Appearance: He is well-developed.  ?HENT:  ?   Head: Normocephalic and atraumatic.  ?   Right Ear: Hearing normal.  ?   Left Ear: Hearing normal.  ?   Nose: Nose normal.  ?Eyes:  ?   General: Lids are normal. No scleral icterus.    ?   Right eye: No discharge.     ?   Left eye: No discharge.  ?   Conjunctiva/sclera: Conjunctivae normal.  ?Cardiovascular:  ?  Rate and Rhythm: Normal rate and regular rhythm.  ?   Heart sounds: Normal heart sounds.  ?Pulmonary:  ?   Effort: Pulmonary effort is normal. No respiratory distress.  ?Skin: ?   General: Skin is warm and dry.  ?   Findings: No lesion or rash.  ?Neurological:  ?   General: No focal deficit present.  ?   Mental Status: He is alert and oriented to person, place, and time.  ?Psychiatric:     ?   Mood and Affect: Mood normal.     ?   Speech: Speech normal.     ?   Behavior: Behavior normal.     ?   Thought Content: Thought content normal.     ?   Judgment: Judgment normal.  ?  ? ? ?No results found for any visits on 07/12/21. ? Assessment & Plan  ?  ? ?1. Arthritis ?Psoriatic arthritis. ? ?2. Left pulmonary embolus ([place]) ?History of PE after surgery ? ?3. Psoriatic arthritis  ([place]) ? ? ?4. Elevated ferritin, hemoglobin, and red blood cell count (HCC) ? ? ? ?No follow-ups on file.  ?   ? ?I, [name], MD, have reviewed all documentation for this visit. The documentation on 07/18/21 for the exam, diagnosis, procedures, and orders are all accurate and complete. ? ? ? ?Jannine Abreu Cranford Mon, MD  ?[facility] ?[phone] (phone) ?[phone] (fax) ? ?[facility] ?

## 2021-07-12 ENCOUNTER — Encounter: Payer: Self-pay | Admitting: Family Medicine

## 2021-07-12 ENCOUNTER — Ambulatory Visit: Payer: BC Managed Care – PPO | Admitting: Family Medicine

## 2021-07-12 VITALS — BP 120/82 | HR 83 | Temp 98.5°F | Resp 16 | Wt 215.0 lb

## 2021-07-12 DIAGNOSIS — R7989 Other specified abnormal findings of blood chemistry: Secondary | ICD-10-CM

## 2021-07-12 DIAGNOSIS — I2699 Other pulmonary embolism without acute cor pulmonale: Secondary | ICD-10-CM

## 2021-07-12 DIAGNOSIS — M1990 Unspecified osteoarthritis, unspecified site: Secondary | ICD-10-CM | POA: Diagnosis not present

## 2021-07-12 DIAGNOSIS — L4050 Arthropathic psoriasis, unspecified: Secondary | ICD-10-CM | POA: Diagnosis not present

## 2021-07-12 DIAGNOSIS — R718 Other abnormality of red blood cells: Secondary | ICD-10-CM

## 2021-07-12 DIAGNOSIS — D582 Other hemoglobinopathies: Secondary | ICD-10-CM

## 2021-07-17 NOTE — Unmapped (Signed)
[facility] Refill Coordination Note    Specialty Medication(s) to be Shipped:   Inflammatory Disorders: Humira    Other medication(s) to be shipped: No additional medications requested for fill at this time     Joshua Mccall, DOB: [birth year]  Phone: [phone] (home)       All above HIPAA information was verified with patient.     Was a Nurse, learning disability used for this call? No    Completed refill call assessment today to schedule patient's medication shipment from the [facility] [phone]).  All relevant notes have been reviewed.     Specialty medication(s) and dose(s) confirmed: Regimen is correct and unchanged.   Changes to medications: [name] reports no changes at this time.  Changes to insurance: No  New side effects reported not previously addressed with a pharmacist or physician: None reported  Questions for the pharmacist: No    Confirmed patient received a Conservation officer, historic buildings and a Surveyor, mining with first shipment. The patient will receive a drug information handout for each medication shipped and additional FDA Medication Guides as required.       DISEASE/MEDICATION-SPECIFIC INFORMATION        For patients on injectable medications: Patient currently has 0 doses left.  Next injection is scheduled for 4/30.    SPECIALTY MEDICATION ADHERENCE     Medication Adherence    Patient reported X missed doses in the last month: 0  Specialty Medication: Humira              Were doses missed due to medication being on hold? No        REFERRAL TO PHARMACIST     Referral to the pharmacist: Not needed      [facility]     Shipping address confirmed in Epic.     Delivery Scheduled: Yes, Expected medication delivery date: 4/27.     Medication will be delivered via Same Day Courier to the prescription address in Epic WAM.    Westley Gambles   [facility] Specialty Technician

## 2021-07-18 DIAGNOSIS — L4050 Arthropathic psoriasis, unspecified: Secondary | ICD-10-CM | POA: Insufficient documentation

## 2021-07-19 MED FILL — HUMIRA SYRINGE CITRATE FREE 40 MG/0.4 ML: SUBCUTANEOUS | 28 days supply | Qty: 2 | Fill #10

## 2021-08-10 NOTE — Unmapped (Signed)
Joshua Mccall    Specialty Medication(s) to be Shipped:   Inflammatory Disorders: Humira    Other medication(s) to be shipped: No additional medications requested for fill at this time     Joshua Mccall, DOB: [birth year]  Phone: [phone] (home)       All above HIPAA information was verified with patient.     Was a Nurse, learning disability used for this call? No    Completed refill call assessment today to schedule patient's medication shipment from the [facility] [phone]).  All relevant notes have been reviewed.     Specialty medication(s) and dose(s) confirmed: Regimen is correct and unchanged.   Changes to medications: Joshua Mccall reports no changes at this time.  Changes to insurance: No  New side effects reported not previously addressed with a pharmacist or physician: None reported  Questions for the pharmacist: No    Confirmed patient received a Conservation officer, historic buildings and a Surveyor, mining with first shipment. The patient will receive a drug information handout for each medication shipped and additional FDA Medication Guides as required.       DISEASE/MEDICATION-SPECIFIC INFORMATION        For patients on injectable medications: Patient currently has 0 doses left.  Next injection is scheduled for 5/28.    SPECIALTY MEDICATION ADHERENCE     Medication Adherence    Patient reported X missed doses in the last month: 0  Specialty Medication: Humira  Patient is on additional specialty medications: No  Patient is on more than two specialty medications: No  Any gaps in refill history greater than 2 weeks in the last 3 months: no  Demonstrates understanding of importance of adherence: yes  Informant: patient              Were doses missed due to medication being on hold? No    Humira 40mg /0.81ml: Patient has 0 days of medication on hand    REFERRAL TO PHARMACIST     Referral to the pharmacist: Not needed      [facility]     Shipping address confirmed in Epic. Delivery Scheduled: Yes, Expected medication delivery date: 5/25.     Medication will be delivered via Same Day Courier to the prescription address in Epic WAM.    Joshua Mccall   [facility] Specialty Technician

## 2021-08-16 MED FILL — HUMIRA SYRINGE CITRATE FREE 40 MG/0.4 ML: SUBCUTANEOUS | 28 days supply | Qty: 2 | Fill #11

## 2021-08-17 DIAGNOSIS — L4050 Arthropathic psoriasis, unspecified: Principal | ICD-10-CM

## 2021-08-17 MED ORDER — HUMIRA SYRINGE CITRATE FREE 40 MG/0.4 ML
SUBCUTANEOUS | 3 refills | 84 days
Start: 2021-08-17 — End: 2022-08-17

## 2021-08-17 NOTE — Unmapped (Signed)
Humira refill  Last Visit Date: 03/12/2021  Next Visit Date: 09/10/2021    Lab Results   Component Value Date    ALT 42 09/22/2020    AST 26 09/22/2020    ALBUMIN 4.5 05/15/2020    CREATININE 0.78 09/22/2020     Lab Results   Component Value Date    WBC 5.7 09/22/2020    HGB 16.3 09/22/2020    HCT 45.7 09/22/2020    PLT 188 09/22/2020     Lab Results   Component Value Date    NEUTROPCT 40.1 09/22/2020    LYMPHOPCT 38.0 09/22/2020    MONOPCT 11.3 09/22/2020    EOSPCT 9.9 09/22/2020    BASOPCT 0.7 09/22/2020

## 2021-08-18 MED ORDER — HUMIRA SYRINGE CITRATE FREE 40 MG/0.4 ML
SUBCUTANEOUS | 3 refills | 84 days | Status: CP
Start: 2021-08-18 — End: 2022-08-18
  Filled 2021-09-13: qty 2, 28d supply, fill #0

## 2021-09-07 NOTE — Unmapped (Signed)
[facility] Refill Coordination Note    Specialty Medication(s) to be Shipped:   Inflammatory Disorders: Humira    Other medication(s) to be shipped: No additional medications requested for fill at this time     Joshua Mccall, DOB: Aug 01, 1960  Phone: [phone] (home)       All above HIPAA information was verified with patient.     Was a Nurse, learning disability used for this call? No    Completed refill call assessment today to schedule patient's medication shipment from the [facility] [phone]).  All relevant notes have been reviewed.     Specialty medication(s) and dose(s) confirmed: Regimen is correct and unchanged.   Changes to medications: Joshua Mccall reports no changes at this time.  Changes to insurance: No  New side effects reported not previously addressed with a pharmacist or physician: None reported  Questions for the pharmacist: No    Confirmed patient received a Conservation officer, historic buildings and a Surveyor, mining with first shipment. The patient will receive a drug information handout for each medication shipped and additional FDA Medication Guides as required.       DISEASE/MEDICATION-SPECIFIC INFORMATION        For patients on injectable medications: Patient currently has 0 doses left.  Next injection is scheduled for 6/25.    SPECIALTY MEDICATION ADHERENCE     Medication Adherence    Patient reported X missed doses in the last month: 0  Specialty Medication: Humira  Patient is on additional specialty medications: No  Patient is on more than two specialty medications: No  Any gaps in refill history greater than 2 weeks in the last 3 months: no  Demonstrates understanding of importance of adherence: yes  Informant: patient              Were doses missed due to medication being on hold? No    Humira 40mg /0.43ml: Patient has 0 days of medication on hand    REFERRAL TO PHARMACIST     Referral to the pharmacist: Not needed      [facility]     Shipping address confirmed in Epic. Delivery Scheduled: Yes, Expected medication delivery date: 6/22.     Medication will be delivered via Same Day Courier to the prescription address in Epic WAM.    Olga Millers   [facility] Specialty Technician

## 2021-09-08 DIAGNOSIS — E782 Mixed hyperlipidemia: Secondary | ICD-10-CM | POA: Diagnosis not present

## 2021-09-08 DIAGNOSIS — I10 Essential (primary) hypertension: Secondary | ICD-10-CM | POA: Diagnosis not present

## 2021-09-09 NOTE — Unmapped (Signed)
Rheumatology Clinic Follow-up Note     Assessment/Plan:    Joshua Mccall is a 61 y.o.  male with a PMH of provoked PE, inverse psoriasis, and psoriatic arthritis with [place] evidence of tenosynovitis of the L 3rd toe and possible L sacroiliitis. He also has significant MSK symptoms related to prior injuries including rotator cuff tears requiring surgery, R biceps tendon tear requiring surgery, bilateral wrist fractures, ? knee injury. He comes into clinic today for follow up of PsA. He started Humira around 03/2020.    PsA - Inverse Psoriasis: He was started on Humira around beginning of 2022 and has had significant improvement. Joints remain controlled today on exam, ongoing pain most consistent with OA and complications from previous injuries (most notable a likely L gluteal tear leading to fairly bulky soft tissue calcification that is seen on plain film). He is still describing a wearing off effect around d10-12 that is not worsening. We again discussed options including increasing humira to q7d (after checking Ab/levels), adding MTX, or changing to alternative agent. He opts to continue Humira as currently Rx for now.  - Cont Humira q14  - Getting upcoming labs with PCP, asked to send along    Mild dorsal L foot pain near 3rd MTP: No clear active disease on exam, location and description of sensation of walking on a pebble raise question of Morton's neuroma although he is without any neuropathic features. Other considerations are OA vs. less likely enthesitis. As above, had discussed about escalation of therapy but he defers so we will monitor for now.  - Update feet films at next visit  - Consider [place] if symptoms worsen    Return in about 6 months (around 03/12/2022).    Patient was discussed with attending physician, Dr.  Gevena Barre, MD   Rheumatology Fellow, PGY5    Primary Care Provider: PCP Information Unavailable    HPI:  Joshua Mccall is a 61 y.o.  male with a PMH of provoked PE, inverse psoriasis, and psoriatic arthritis with [place] evidence of tenosynovitis of the L 3rd toe and possible L sacroiliitis. He also has significant MSK symptoms related to prior injuries including rotator cuff tears requiring surgery, R biceps tendon tear requiring surgery, bilateral wrist fractures, ? knee injury. He comes into clinic today for follow up of PsA.    Today  Humira still working well overall and has not had any joint swelling/redness. Psoriasis is completely gone and his joint pain/swelling remains much improved. He continues to have joint pain predominately inbetween his L 3-4 toe that feels like he is walking on a pebble. No associated numbness or tingling. Most bothersome is his L gluteal area. This comes from a soft ball injury in [place] in which he slid for a base and felt immediate and immense pain in his L gluteal area. Able to continue to lift weights without significant impairment. Humira knots he was getting are now resolved. Still feels there is a wearing off effect around d10-12. Last injection was Sunday.     History  Joshua Mccall has had inverse psoriasis since 2018-2019, with symptoms primarily in his groin region but more recently spreading to his wrist.  He was seen a dermatologist and had used topical corticosteroids which previously controlled his psoriasis. He notes he has chronic joint pain related to multiple injuries dating back as early as middle school. His prior injuries include bilateral rotator cuff disease with bilateral repairs.  He also had a right  biceps tendon rupture with repair.  He also fractured both wrists. However, during 2021 he had worsening left third toe pain with a sensation of a marble under the foot and swelling with brownish-red discoloration, and tenosynovitis and arthritis seen on ultrasound.   In addition to his left third toe pain and swelling he also noted new right medial elbow pain and tenderness in 2021. He also had right lateral/posterior hip/buttocks pain that is worse in the morning and improves throughout the day, with possible left sarcoiliitis on xray.    Review of Systems:  Positive findings noted above, otherwise a 14 point review of systems was reviewed and negative    Past Medical, Surgical, Family and Social History reviewed and updated per EMR     Allergies:  Mercury and Penicillins    Medications:     Current Outpatient Medications:     aspirin (ECOTRIN) 81 MG tablet, Take 81 mg by mouth., Disp: , Rfl:     betamethasone, augmented, (DIPROLENE) 0.05 % cream, APPLY TO THE AFFECTED AREA ON ARMS TWICE DAILY AS NEEDED, Disp: , Rfl:     calcipotriene (DOVONOX) 0.005 % ointment, APPLY TWICE DAILY MONDAY-FRIDAY, Disp: , Rfl:     cholecalciferol, vitamin D3-50 mcg, 2,000 unit,, 50 mcg (2,000 unit) cap, Take 1 capsule by mouth., Disp: , Rfl:     desonide (DESOWEN) 0.05 % cream, apply to affected area twice a day if needed, Disp: , Rfl:     empty container Misc, Use as directed, Disp: 1 each, Rfl: 2    HUMIRA SYRINGE CITRATE FREE 40 MG/0.4 ML, Inject 0.4 mL (40 mg total) under the skin every fourteen (14) days., Disp: 6 each, Rfl: 3    hydrocortisone 2.5 % cream, Insert into the rectum., Disp: , Rfl:     multivitamin (TAB-A-VITE/THERAGRAN) per tablet, Take 1 tablet by mouth., Disp: , Rfl:     omega 3-dha-epa-fish oil (FISH OIL) 100-160-1,000 mg cap, Take 2 capsules by mouth., Disp: , Rfl:     PAXLOVID CO-PACK, EUA, (PAXLOVID CO-PACK, EUA,) 300 mg (150 mg x 2)-100 mg tablet, See package instructions., Disp: 30 tablet, Rfl: 0    tumeric-ging-olive-oreg-capryl 100 mg-150 mg- 50 mg-150 mg cap, Take by mouth., Disp: , Rfl:     vitamin E-268 mg, 400 UNIT, 268 mg (400 UNIT) capsule, Take 1 capsule by mouth., Disp: , Rfl:       Objective   Vitals:    09/10/21 0745   BP: 145/100   Pulse: 82   Temp: 37.2 ??C (98.9 ??F)   Weight: 100.2 kg (221 lb)   Height: 182.9 cm (6')       Physical Exam  General: well appearing, no acute distress  Eyes: EOMI, normal conjunctivae  ENT: MMM. Oropharynx without any erythema or exudate.  No oral or nasal ulcers.  Neck: supple. No cervical lymphadenopathy  Cardiovascular: Regular rate and rhythm. No murmurs, rubs or gallops.   Pulmonary: Clear to auscultation bilaterally. Normal work of breathing.  Skin: small pink plaque on L wrist. Otherwise no rash, lesions, breakdown on a clothed exam. No purpura or petechiae. No digital ulcers.   Extremities: Warm and well perfused, no cyanosis, clubbing or edema  Musculoskeletal: Slight TTP on plantar aspect of L foot just lateral to 3rd MTP. No bogginess/warmth of this joint or TTP on the dorsal aspect of this joint. No synovitis or tenderness to palpation in the hands, wrists, elbows, shoulders, knees, ankles, or feet. Slightly reduced ROM of bilateral wrists and  elbows. Shoulders with slightly limited abduction to ~80 degrees. Full ROM throughout otherwise.   Neurologic: Cranial nerves grossly intact, strength 5/5 throughout.  Normal sensation  Psychiatric: Normal mood and affect.

## 2021-09-10 ENCOUNTER — Ambulatory Visit
Admit: 2021-09-10 | Discharge: 2021-09-11 | Payer: PRIVATE HEALTH INSURANCE | Attending: Student in an Organized Health Care Education/Training Program | Primary: Student in an Organized Health Care Education/Training Program

## 2021-09-10 DIAGNOSIS — L409 Psoriasis, unspecified: Secondary | ICD-10-CM | POA: Diagnosis not present

## 2021-09-10 DIAGNOSIS — L4050 Arthropathic psoriasis, unspecified: Secondary | ICD-10-CM | POA: Diagnosis not present

## 2021-09-10 NOTE — Unmapped (Signed)
I reviewed the case with the fellow but did not see the patient.  I agree with the assessment and plan as documented in the fellow's note.    Jaeceon Michelin, MD, MSCI  Assistant Professor of Medicine  Department of Medicine/Division of Rheumatology  [employer]  [phone] clinic phone  [phone] clinic secure fax

## 2021-09-12 DIAGNOSIS — I10 Essential (primary) hypertension: Secondary | ICD-10-CM | POA: Diagnosis not present

## 2021-09-12 DIAGNOSIS — R7301 Impaired fasting glucose: Secondary | ICD-10-CM | POA: Diagnosis not present

## 2021-09-12 DIAGNOSIS — E6609 Other obesity due to excess calories: Secondary | ICD-10-CM | POA: Diagnosis not present

## 2021-09-12 DIAGNOSIS — E782 Mixed hyperlipidemia: Secondary | ICD-10-CM | POA: Diagnosis not present

## 2021-10-08 NOTE — Unmapped (Signed)
[facility] Clinical Assessment & Refill Coordination Note    Joshua Mccall, DOB: 1960-04-27  Phone: [phone] (home)     All above HIPAA information was verified with patient.     Was a Nurse, learning disability used for this call? No    Specialty Medication(s):   Inflammatory Disorders: Humira     Current Outpatient Medications   Medication Sig Dispense Refill   ??? aspirin (ECOTRIN) 81 MG tablet Take 81 mg by mouth.     ??? betamethasone, augmented, (DIPROLENE) 0.05 % cream APPLY TO THE AFFECTED AREA ON ARMS TWICE DAILY AS NEEDED     ??? calcipotriene (DOVONOX) 0.005 % ointment APPLY TWICE DAILY MONDAY-FRIDAY     ??? cholecalciferol, vitamin D3-50 mcg, 2,000 unit,, 50 mcg (2,000 unit) cap Take 1 capsule by mouth.     ??? desonide (DESOWEN) 0.05 % cream apply to affected area twice a day if needed     ??? empty container Misc Use as directed 1 each 2   ??? HUMIRA SYRINGE CITRATE FREE 40 MG/0.4 ML Inject 0.4 mL (40 mg total) under the skin every fourteen (14) days. 6 each 3   ??? hydrocortisone 2.5 % cream Insert into the rectum.     ??? multivitamin (TAB-A-VITE/THERAGRAN) per tablet Take 1 tablet by mouth.     ??? omega 3-dha-epa-fish oil (FISH OIL) 100-160-1,000 mg cap Take 2 capsules by mouth.     ??? PAXLOVID CO-PACK, EUA, (PAXLOVID CO-PACK, EUA,) 300 mg (150 mg x 2)-100 mg tablet See package instructions. 30 tablet 0   ??? vitamin E-268 mg, 400 UNIT, 268 mg (400 UNIT) capsule Take 1 capsule by mouth.       No current facility-administered medications for this visit.        Changes to medications: [name] reports no changes at this time.    Allergies   Allergen Reactions   ??? Mercury    ??? Penicillins        Changes to allergies: No    SPECIALTY MEDICATION ADHERENCE       Medication Adherence    Patient reported X missed doses in the last month: 0  Specialty Medication: Humira q14d  Patient is on additional specialty medications: No  Informant: patient          Specialty medication(s) dose(s) confirmed: Regimen is correct and unchanged.     Are there any concerns with adherence? No    Adherence counseling provided? Not needed    CLINICAL MANAGEMENT AND INTERVENTION      Clinical Benefit Assessment:    Do you feel the medicine is effective or helping your condition? Yes    Clinical Benefit counseling provided? Progress note from 7/19 shows evidence of clinical benefit    Adverse Effects Assessment:    Are you experiencing any side effects? No    Are you experiencing difficulty administering your medicine? No    Quality of Life Assessment:    Quality of Life    Rheumatology  1. What impact has your specialty medication had on the reduction of your daily pain level?: Tremendous  2. What impact has your specialty medication had on your ability to complete daily tasks (prepare meals, get dressed, etc...)?Marland Kitchen Tremendous  Oncology  Dermatology  Cystic Fibrosis              Have you discussed this with your provider? Yes    Acute Infection Status:    Acute infections noted within Epic:  No active infections  Patient reported infection: None    Therapy Appropriateness:    Is therapy appropriate and patient progressing towards therapeutic goals? Yes, therapy is appropriate and should be continued    DISEASE/MEDICATION-SPECIFIC INFORMATION      For patients on injectable medications: Patient currently has 0 doses left.  Next injection is scheduled for 7/23.    PATIENT SPECIFIC NEEDS     - Does the patient have any physical, cognitive, or cultural barriers? No    - Is the patient high risk? No    - Does the patient require a Care Management Plan? No     SOCIAL DETERMINANTS OF HEALTH     At the [facility], we have learned that life circumstances - like trouble affording food, housing, utilities, or transportation can affect the health of many of our patients.   That is why we wanted to ask: are you currently experiencing any life circumstances that are negatively impacting your health and/or quality of life? No    Social Determinants of Psychologist, prison and probation services Strain: Not on file   Internet Connectivity: Not on file   Food Insecurity: Not on file   Tobacco Use: Low Risk    ??? Smoking Tobacco Use: Never   ??? Smokeless Tobacco Use: Never   ??? Passive Exposure: Not on file   Housing/Utilities: Not on file   Alcohol Use: Not on file   Transportation Needs: Not on file   Substance Use: Not on file   Health Literacy: Not on file   Physical Activity: Not on file   Interpersonal Safety: Not on file   Stress: Not on file   Intimate Partner Violence: Not on file   Depression: Not on file   Social Connections: Not on file       Would you be willing to receive help with any of the needs that you have identified today? No       SHIPPING     Specialty Medication(s) to be Shipped:   Inflammatory Disorders: Humira    Other medication(s) to be shipped: No additional medications requested for fill at this time     Changes to insurance: No    Delivery Scheduled: Yes, Expected medication delivery date: 7/19.     Medication will be delivered via Same Day Courier to the confirmed prescription address in [facility].    The patient will receive a drug information handout for each medication shipped and additional FDA Medication Guides as required.  Verified that patient has previously received a Conservation officer, historic buildings and a Surveyor, mining.    The patient or caregiver noted above participated in the development of this care plan and knows that they can request review of or adjustments to the care plan at any time.      All of the patient's questions and concerns have been addressed.    Julianne Rice   [facility] Specialty Pharmacist

## 2021-10-10 MED FILL — HUMIRA SYRINGE CITRATE FREE 40 MG/0.4 ML: SUBCUTANEOUS | 28 days supply | Qty: 2 | Fill #1

## 2021-11-01 NOTE — Unmapped (Signed)
[facility] Refill Coordination Note    Specialty Medication(s) to be Shipped:   Inflammatory Disorders: Humira    Other medication(s) to be shipped: No additional medications requested for fill at this time     Joshua Mccall, DOB: [birth year]  Phone: [phone] (home)       All above HIPAA information was verified with patient.     Was a Nurse, learning disability used for this call? No    Completed refill call assessment today to schedule patient's medication shipment from the [facility] [phone]).  All relevant notes have been reviewed.     Specialty medication(s) and dose(s) confirmed: Regimen is correct and unchanged.   Changes to medications: Joshua Mccall reports no changes at this time.  Changes to insurance: No  New side effects reported not previously addressed with a pharmacist or physician: None reported  Questions for the pharmacist: No    Confirmed patient received a Conservation officer, historic buildings and a Surveyor, mining with first shipment. The patient will receive a drug information handout for each medication shipped and additional FDA Medication Guides as required.       DISEASE/MEDICATION-SPECIFIC INFORMATION        For patients on injectable medications: Patient currently has 0 doses left.  Next injection is scheduled for 8/21.    SPECIALTY MEDICATION ADHERENCE     Medication Adherence    Patient reported X missed doses in the last month: 0  Specialty Medication: Humira  Patient is on additional specialty medications: No  Patient is on more than two specialty medications: No  Any gaps in refill history greater than 2 weeks in the last 3 months: no  Demonstrates understanding of importance of adherence: yes  Informant: patient                          Were doses missed due to medication being on hold? No    Humira 40mg /0.103ml: Patient has 0 days of medication on hand    REFERRAL TO PHARMACIST     Referral to the pharmacist: Not needed      [facility]     Shipping address confirmed in Epic.     Delivery Scheduled: Yes, Expected medication delivery date: 8/17.     Medication will be delivered via Same Day Courier to the prescription address in Epic WAM.    Joshua Mccall   [facility] Specialty Technician

## 2021-11-08 MED FILL — HUMIRA SYRINGE CITRATE FREE 40 MG/0.4 ML: SUBCUTANEOUS | 28 days supply | Qty: 2 | Fill #2

## 2021-12-04 NOTE — Unmapped (Signed)
[facility] Refill Coordination Note    Specialty Medication(s) to be Shipped:   Inflammatory Disorders: Humira    Other medication(s) to be shipped: No additional medications requested for fill at this time     WINSLOW ARTIAGA, DOB: [birth year]  Phone: [phone] (home)       All above HIPAA information was verified with patient.     Was a Nurse, learning disability used for this call? No    Completed refill call assessment today to schedule patient's medication shipment from the [facility] [phone]).  All relevant notes have been reviewed.     Specialty medication(s) and dose(s) confirmed: Regimen is correct and unchanged.   Changes to medications: Anan reports no changes at this time.  Changes to insurance: No  New side effects reported not previously addressed with a pharmacist or physician: None reported  Questions for the pharmacist: No    Confirmed patient received a Conservation officer, historic buildings and a Surveyor, mining with first shipment. The patient will receive a drug information handout for each medication shipped and additional FDA Medication Guides as required.       DISEASE/MEDICATION-SPECIFIC INFORMATION        For patients on injectable medications: Patient currently has 0 doses left.  Next injection is scheduled for 9/17.    SPECIALTY MEDICATION ADHERENCE     Medication Adherence    Patient reported X missed doses in the last month: 0  Specialty Medication: Humira  Patient is on additional specialty medications: No  Patient is on more than two specialty medications: No  Any gaps in refill history greater than 2 weeks in the last 3 months: no  Demonstrates understanding of importance of adherence: yes  Informant: patient                          Were doses missed due to medication being on hold? No    Humira 40mg /0.10ml: Patient has 0 days of medication on hand     REFERRAL TO PHARMACIST     Referral to the pharmacist: Not needed      [facility]     Shipping address confirmed in Epic.     Delivery Scheduled: Yes, Expected medication delivery date: 9/14.     Medication will be delivered via Same Day Courier to the prescription address in Epic WAM.    Olga Millers   [facility] Specialty Technician

## 2021-12-06 MED FILL — HUMIRA SYRINGE CITRATE FREE 40 MG/0.4 ML: SUBCUTANEOUS | 28 days supply | Qty: 2 | Fill #3

## 2021-12-13 DIAGNOSIS — L408 Other psoriasis: Secondary | ICD-10-CM | POA: Diagnosis not present

## 2021-12-13 DIAGNOSIS — D225 Melanocytic nevi of trunk: Secondary | ICD-10-CM | POA: Diagnosis not present

## 2021-12-13 DIAGNOSIS — Z85828 Personal history of other malignant neoplasm of skin: Secondary | ICD-10-CM | POA: Diagnosis not present

## 2021-12-13 DIAGNOSIS — L821 Other seborrheic keratosis: Secondary | ICD-10-CM | POA: Diagnosis not present

## 2021-12-27 NOTE — Unmapped (Signed)
[facility] Refill Coordination Note    Specialty Medication(s) to be Shipped:   Inflammatory Disorders: Humira    Other medication(s) to be shipped: No additional medications requested for fill at this time     Joshua Mccall, DOB: July 10, 1960  Phone: [phone] (home)       All above HIPAA information was verified with patient.     Was a Nurse, learning disability used for this call? No    Completed refill call assessment today to schedule patient's medication shipment from the [facility] [phone]).  All relevant notes have been reviewed.     Specialty medication(s) and dose(s) confirmed: Regimen is correct and unchanged.   Changes to medications: Veer reports no changes at this time.  Changes to insurance: No  New side effects reported not previously addressed with a pharmacist or physician: None reported  Questions for the pharmacist: No    Confirmed patient received a Conservation officer, historic buildings and a Surveyor, mining with first shipment. The patient will receive a drug information handout for each medication shipped and additional FDA Medication Guides as required.       DISEASE/MEDICATION-SPECIFIC INFORMATION        For patients on injectable medications: Patient currently has 0 doses left.  Next injection is scheduled for 01/06/22.    SPECIALTY MEDICATION ADHERENCE     Medication Adherence    Patient reported X missed doses in the last month: 0  Specialty Medication: HUMIRA(CF) 40 mg/0.4 mL  Patient is on additional specialty medications: No                                Were doses missed due to medication being on hold? No    Humira 40/0.4 mg/ml: 0 days of medicine on hand        REFERRAL TO PHARMACIST     Referral to the pharmacist: Not needed      [facility]     Shipping address confirmed in Epic.     Delivery Scheduled: Yes, Expected medication delivery date: 01/03/22.     Medication will be delivered via Same Day Courier to the prescription address in Epic WAM.    Unk Lightning   [facility] Specialty Technician

## 2022-01-01 ENCOUNTER — Other Ambulatory Visit (HOSPITAL_BASED_OUTPATIENT_CLINIC_OR_DEPARTMENT_OTHER): Payer: Self-pay

## 2022-01-01 MED ORDER — FLUARIX QUADRIVALENT 0.5 ML IM SUSY
PREFILLED_SYRINGE | INTRAMUSCULAR | 0 refills | Status: DC
  Filled 2022-01-01: qty 0.5, 1d supply, fill #0

## 2022-01-03 MED FILL — HUMIRA SYRINGE CITRATE FREE 40 MG/0.4 ML: SUBCUTANEOUS | 28 days supply | Qty: 2 | Fill #4

## 2022-01-07 ENCOUNTER — Telehealth: Payer: Self-pay | Admitting: Family Medicine

## 2022-01-07 NOTE — Telephone Encounter (Signed)
CB- [phone]  Pt is calling to if Dr. Caryn Section will take him on as a new patient. Please advise

## 2022-01-09 NOTE — Telephone Encounter (Signed)
Not taking new patient's at this time, but Dr Florentina Addison is.

## 2022-01-10 ENCOUNTER — Encounter: Payer: BC Managed Care – PPO | Admitting: Family Medicine

## 2022-01-25 NOTE — Unmapped (Signed)
[facility] Refill Coordination Note    Specialty Medication(s) to be Shipped:   Inflammatory Disorders: Humira    Other medication(s) to be shipped: No additional medications requested for fill at this time     Joshua Mccall, DOB: [birth year]  Phone: [phone] (home)       All above HIPAA information was verified with patient.     Was a Nurse, learning disability used for this call? No    Completed refill call assessment today to schedule patient's medication shipment from the [facility] [phone]).  All relevant notes have been reviewed.     Specialty medication(s) and dose(s) confirmed: Regimen is correct and unchanged.   Changes to medications: Joshua Mccall reports no changes at this time.  Changes to insurance: No  New side effects reported not previously addressed with a pharmacist or physician: None reported  Questions for the pharmacist: No    Confirmed patient received a Conservation officer, historic buildings and a Surveyor, mining with first shipment. The patient will receive a drug information handout for each medication shipped and additional FDA Medication Guides as required.       DISEASE/MEDICATION-SPECIFIC INFORMATION        For patients on injectable medications: Patient currently has 0 doses left.  Next injection is scheduled for 11/12.    SPECIALTY MEDICATION ADHERENCE     Medication Adherence    Patient reported X missed doses in the last month: 0  Specialty Medication: Humira 40mg /0.81ml  Patient is on additional specialty medications: No  Patient is on more than two specialty medications: No  Any gaps in refill history greater than 2 weeks in the last 3 months: no  Demonstrates understanding of importance of adherence: yes  Informant: patient                          Were doses missed due to medication being on hold? No    Humira 40mg /0.36ml: Patient has 0 days of medication on hand    REFERRAL TO PHARMACIST     Referral to the pharmacist: Not needed      [facility]     Shipping address confirmed in Epic.     Delivery Scheduled: Yes, Expected medication delivery date: 11/9.     Medication will be delivered via Same Day Courier to the prescription address in Epic WAM.    Joshua Mccall   [facility] Specialty Technician

## 2022-01-31 MED FILL — HUMIRA SYRINGE CITRATE FREE 40 MG/0.4 ML: SUBCUTANEOUS | 28 days supply | Qty: 2 | Fill #5

## 2022-02-01 DIAGNOSIS — L4050 Arthropathic psoriasis, unspecified: Principal | ICD-10-CM

## 2022-02-27 NOTE — Unmapped (Signed)
[facility] Refill Coordination Note    Specialty Medication(s) to be Shipped:   Inflammatory Disorders: Humira    Other medication(s) to be shipped: No additional medications requested for fill at this time     KYRONE WITTIG, DOB: 15-Jun-1960  Phone: [phone] (home)       All above HIPAA information was verified with patient.     Was a Nurse, learning disability used for this call? No    Completed refill call assessment today to schedule patient's medication shipment from the [facility] [phone]).  All relevant notes have been reviewed.     Specialty medication(s) and dose(s) confirmed: Regimen is correct and unchanged.   Changes to medications: Arpad reports no changes at this time.  Changes to insurance: No  New side effects reported not previously addressed with a pharmacist or physician: None reported  Questions for the pharmacist: No    Confirmed patient received a Conservation officer, historic buildings and a Surveyor, mining with first shipment. The patient will receive a drug information handout for each medication shipped and additional FDA Medication Guides as required.       DISEASE/MEDICATION-SPECIFIC INFORMATION        For patients on injectable medications: Patient currently has 0 doses left.  Next injection is scheduled for 03/03/22.    SPECIALTY MEDICATION ADHERENCE     Medication Adherence    Patient reported X missed doses in the last month: 0  Specialty Medication: HUMIRA(CF) 40 mg/0.4 mL injection (adalimumab)  Patient is on additional specialty medications: No  Patient is on more than two specialty medications: No                          Were doses missed due to medication being on hold? No    Humira (CF) 40mg / 0.22ml: 0 days of medicine on hand       REFERRAL TO PHARMACIST     Referral to the pharmacist: Not needed      [facility]     Shipping address confirmed in Epic.     Delivery Scheduled: Yes, Expected medication delivery date: 02/28/22.     Medication will be delivered via Same Day Courier to the prescription address in Epic WAM.    Nancy Nordmann [facility] Specialty Technician

## 2022-02-28 MED FILL — HUMIRA SYRINGE CITRATE FREE 40 MG/0.4 ML: SUBCUTANEOUS | 28 days supply | Qty: 2 | Fill #6

## 2022-03-29 NOTE — Unmapped (Signed)
[facility] Refill Coordination Note    Specialty Medication(s) to be Shipped:   Inflammatory Disorders: Humira    Other medication(s) to be shipped: No additional medications requested for fill at this time     Joshua Mccall, DOB: January 12, 1961  Phone: [phone] (home)       All above HIPAA information was verified with patient.     Was a Nurse, learning disability used for this call? No    Completed refill call assessment today to schedule patient's medication shipment from the [facility] [phone]).  All relevant notes have been reviewed.     Specialty medication(s) and dose(s) confirmed: Regimen is correct and unchanged.   Changes to medications: [name] reports no changes at this time.  Changes to insurance: No  New side effects reported not previously addressed with a pharmacist or physician: None reported  Questions for the pharmacist: No    Confirmed patient received a Conservation officer, historic buildings and a Surveyor, mining with first shipment. The patient will receive a drug information handout for each medication shipped and additional FDA Medication Guides as required.       DISEASE/MEDICATION-SPECIFIC INFORMATION        For patients on injectable medications: Patient currently has 0 doses left.  Next injection is scheduled for 04/01/22.    SPECIALTY MEDICATION ADHERENCE     Medication Adherence    Patient reported X missed doses in the last month: 0  Specialty Medication: HUMIRA(CF) 40 mg/0.4 mL injection (adalimumab)  Patient is on additional specialty medications: No  Informant: patient                          Were doses missed due to medication being on hold? No    Humira  40 mg/0.63ml: 0 days of medicine on hand       REFERRAL TO PHARMACIST     Referral to the pharmacist: Not needed      [facility]     Shipping address confirmed in Epic.     Delivery Scheduled: Yes, Expected medication delivery date: 04/01/22.     Medication will be delivered via Same Day Courier to the prescription address in Epic WAM.    Joshua Mccall   [facility] Specialty Technician

## 2022-04-01 MED FILL — HUMIRA SYRINGE CITRATE FREE 40 MG/0.4 ML: SUBCUTANEOUS | 28 days supply | Qty: 2 | Fill #7

## 2022-04-18 ENCOUNTER — Ambulatory Visit
Admit: 2022-04-18 | Discharge: 2022-04-19 | Payer: PRIVATE HEALTH INSURANCE | Attending: Student in an Organized Health Care Education/Training Program | Primary: Student in an Organized Health Care Education/Training Program

## 2022-04-18 NOTE — Unmapped (Signed)
Rheumatology Clinic Follow-up Note     Assessment/Plan:    Joshua Mccall is a 62 y.o.  male with a PMH of provoked PE, inverse psoriasis, and psoriatic arthritis with [place] evidence of tenosynovitis of the L 3rd toe and possible L sacroiliitis. He also has significant MSK symptoms related to prior injuries including rotator cuff tears requiring surgery, R biceps tendon tear requiring surgery, bilateral wrist fractures, ? knee injury. He comes into clinic today for follow up of PsA. He started Humira around 03/2020.     PsA - Inverse Psoriasis: Doing well today on Humira. No skin activity, has some waning effect around d10-12 with regard to arthralgias but no active disease seen today. We discussed checking Abs and the potential to increase to q7d dosing but as he reports symptoms are mild we will monitor for now.  - Cont Humira q14    Return in about 6 months (around 10/17/2022).    Fleeta Emmer, MD  Assistant Professor of Medicine  Department of Medicine/Division of Rheumatology  [facility]    Primary Care Provider: Pcp, Information Unavailable    HPI:  Joshua Mccall is a 62 y.o.  male with a PMH of provoked PE, inverse psoriasis, and psoriatic arthritis with [place] evidence of tenosynovitis of the L 3rd toe and possible L sacroiliitis. He also has significant MSK symptoms related to prior injuries including rotator cuff tears requiring surgery, R biceps tendon tear requiring surgery, bilateral wrist fractures, ? knee injury. He comes into clinic today for follow up of PsA. He started Humira around 03/2020.    Today  Patient reports he is doing OK overall. He continues to have frustration that if he alters his exercise regimen at all it throws him off easily. Psoriasis is completely gone (has remained completely gone). Joints continue to do well overall, outside of a few areas that he has previously traumatized before. No swelling that he has noticed. Tolerating Humira well.    Disease History  [name] has had inverse psoriasis since 2018-2019, with symptoms primarily in his groin region but more recently spreading to his wrist.  He was seen a dermatologist and had used topical corticosteroids which previously controlled his psoriasis. He notes he has chronic joint pain related to multiple injuries dating back as early as middle school. His prior injuries include bilateral rotator cuff disease with bilateral repairs.  He also had a right biceps tendon rupture with repair.  He also fractured both wrists. However, during 2021 he had worsening left third toe pain with a sensation of a marble under the foot and swelling with brownish-red discoloration, and tenosynovitis and arthritis seen on ultrasound.   In addition to his left third toe pain and swelling he also noted new right medial elbow pain and tenderness in 2021. He also had right lateral/posterior hip/buttocks pain that is worse in the morning and improves throughout the day, with possible left sarcoiliitis on xray.       Review of Systems:  Positive findings noted above, otherwise a 14 point review of systems was reviewed and negative    Past Medical, Surgical, Family and Social History reviewed and updated per EMR     Allergies:  Mercury and Penicillins    Medications:     Current Outpatient Medications:     aspirin (ECOTRIN) 81 MG tablet, Take 1 tablet (81 mg total) by mouth., Disp: , Rfl:     betamethasone, augmented, (DIPROLENE) 0.05 % cream, APPLY TO THE AFFECTED AREA  ON ARMS TWICE DAILY AS NEEDED, Disp: , Rfl:     calcipotriene (DOVONOX) 0.005 % ointment, APPLY TWICE DAILY MONDAY-FRIDAY, Disp: , Rfl:     cholecalciferol, vitamin D3-50 mcg, 2,000 unit,, 50 mcg (2,000 unit) cap, Take 1 capsule (50 mcg total) by mouth., Disp: , Rfl:     desonide (DESOWEN) 0.05 % cream, apply to affected area twice a day if needed, Disp: , Rfl:     empty container Misc, Use as directed, Disp: 1 each, Rfl: 2    HUMIRA SYRINGE CITRATE FREE 40 MG/0.4 ML, Inject 0.4 mL (40 mg total) under the skin every fourteen (14) days., Disp: 6 each, Rfl: 3    hydrocortisone 2.5 % cream, Insert into the rectum., Disp: , Rfl:     multivitamin (TAB-A-VITE/THERAGRAN) per tablet, Take 1 tablet by mouth., Disp: , Rfl:     omega 3-dha-epa-fish oil (FISH OIL) 100-160-1,000 mg cap, Take 2 capsules by mouth., Disp: , Rfl:     vitamin E-268 mg, 400 UNIT, 268 mg (400 UNIT) capsule, Take 1 capsule (268 mg total) by mouth., Disp: , Rfl:     PAXLOVID CO-PACK, EUA, (PAXLOVID CO-PACK, EUA,) 300 mg (150 mg x 2)-100 mg tablet, See package instructions. (Patient not taking: Reported on 04/18/2022), Disp: 30 tablet, Rfl: 0      Objective   Vitals:    04/18/22 0933   BP: 152/108   Pulse: 81   Temp: 37 ??C (98.6 ??F)   TempSrc: Temporal   Weight: 95.7 kg (211 lb)       Physical Exam  General: well appearing, no acute distress  Eyes: EOMI, normal conjunctivae  ENT: MMM.  Oropharynx without any erythema or exudate.  No oral or nasal ulcers.  Neck: supple. No cervical lymphadenopathy  Cardiovascular: Regular rate and rhythm. No murmurs, rubs or gallops.   Pulmonary: Clear to auscultation bilaterally. Normal work of breathing.  Skin: small pink plaque on L wrist. Otherwise no rash, lesions, breakdown on a clothed exam. No purpura or petechiae. No digital ulcers.   Extremities: Warm and well perfused, no cyanosis, clubbing or edema  Musculoskeletal: Slight TTP on plantar aspect of L foot just lateral to 3rd MTP (stable). No bogginess/warmth of this joint or TTP on the dorsal aspect of this joint. No synovitis or tenderness to palpation in the hands, wrists, elbows, shoulders, knees, ankles, or feet. Slightly reduced ROM of bilateral wrists and elbows. Shoulders with slightly limited abduction to ~80 degrees. Full ROM throughout otherwise.   Neurologic: Cranial nerves grossly intact, strength 5/5 throughout.  Normal sensation  Psychiatric: Normal mood and affect.    I personally spent 34 minutes face-to-face and non-face-to-face in the care of this patient, which includes all pre, intra, and post visit time on the date of service.  All documented time was specific to the E/M visit and does not include any procedures that may have been performed.

## 2022-04-29 NOTE — Unmapped (Signed)
[facility] Refill Coordination Note    Specialty Medication(s) to be Shipped:   Inflammatory Disorders: Humira    Other medication(s) to be shipped: No additional medications requested for fill at this time     Joshua Mccall, DOB: 08/18/1960  Phone: [phone] (home)       All above HIPAA information was verified with patient.     Was a Nurse, learning disability used for this call? No    Completed refill call assessment today to schedule patient's medication shipment from the [facility] [phone]).  All relevant notes have been reviewed.     Specialty medication(s) and dose(s) confirmed: Regimen is correct and unchanged.   Changes to medications: Dionicio reports no changes at this time.  Changes to insurance: No  New side effects reported not previously addressed with a pharmacist or physician: None reported  Questions for the pharmacist: No    Confirmed patient received a Conservation officer, historic buildings and a Surveyor, mining with first shipment. The patient will receive a drug information handout for each medication shipped and additional FDA Medication Guides as required.       DISEASE/MEDICATION-SPECIFIC INFORMATION        For patients on injectable medications: Patient currently has 0 doses left.  Next injection is scheduled for 2/5 but will take on 2/6.    SPECIALTY MEDICATION ADHERENCE              Were doses missed due to medication being on hold? No      REFERRAL TO PHARMACIST     Referral to the pharmacist: Not needed      [facility]     Shipping address confirmed in Epic.     Delivery Scheduled: Yes, Expected medication delivery date: 2/6.     Medication will be delivered via Same Day Courier to the prescription address in Epic WAM.    Julianne Rice, PharmD   [facility] Specialty Pharmacist

## 2022-04-30 MED FILL — HUMIRA SYRINGE CITRATE FREE 40 MG/0.4 ML: SUBCUTANEOUS | 28 days supply | Qty: 2 | Fill #8

## 2022-05-23 NOTE — Unmapped (Signed)
[facility] Refill Coordination Note    Specialty Medication(s) to be Shipped:   Inflammatory Disorders: Humira    Other medication(s) to be shipped: No additional medications requested for fill at this time     KRISEAN CRAVER, DOB: 27-Aug-1960  Phone: [phone] (home)       All above HIPAA information was verified with patient.     Was a Nurse, learning disability used for this call? No    Completed refill call assessment today to schedule patient's medication shipment from the [facility] [phone]).  All relevant notes have been reviewed.     Specialty medication(s) and dose(s) confirmed: Regimen is correct and unchanged.   Changes to medications: Gordon reports no changes at this time.  Changes to insurance: No  New side effects reported not previously addressed with a pharmacist or physician: None reported  Questions for the pharmacist: No    Confirmed patient received a Conservation officer, historic buildings and a Surveyor, mining with first shipment. The patient will receive a drug information handout for each medication shipped and additional FDA Medication Guides as required.       DISEASE/MEDICATION-SPECIFIC INFORMATION        For patients on injectable medications: Patient currently has 0 doses left.  Next injection is scheduled for 05/26/22.    SPECIALTY MEDICATION ADHERENCE     Medication Adherence    Patient reported X missed doses in the last month: 0  Specialty Medication: HUMIRA(CF) 40 mg/0.4 mL injection (adalimumab)  Patient is on additional specialty medications: No              Were doses missed due to medication being on hold? No      REFERRAL TO PHARMACIST     Referral to the pharmacist: Not needed      [facility]     Shipping address confirmed in Epic.     Delivery Scheduled: Yes, Expected medication delivery date: 05/24/22.     Medication will be delivered via Same Day Courier to the prescription address in Epic WAM.    [place] A Janos Shampine   [facility] Specialty Pharmacist

## 2022-05-24 MED FILL — HUMIRA SYRINGE CITRATE FREE 40 MG/0.4 ML: SUBCUTANEOUS | 28 days supply | Qty: 2 | Fill #9

## 2022-06-20 NOTE — Unmapped (Signed)
[facility] Refill [facility]ination [facility]    Specialty Medication(s) to be Shipped:   Inflammatory Disorders: Humira    Other medication(s) to be shipped: No additional medications requested for fill at this time     Joshua Mccall, DOB: 1961/01/02  Phone: [phone] (home)       All above HIPAA information was verified with patient.     Was a Nurse, learning disability used for this call? No    Completed refill call assessment today to schedule patient's medication shipment from the [facility] [phone]).  All relevant notes have been reviewed.     Specialty medication(s) and dose(s) confirmed: Regimen is correct and unchanged.   Changes to medications: Clancy reports no changes at this time.  Changes to insurance: No  New side effects reported not previously addressed with a pharmacist or physician: None reported  Questions for the pharmacist: No    Confirmed patient received a Conservation officer, historic buildings and a Surveyor, mining with first shipment. The patient will receive a drug information handout for each medication shipped and additional FDA Medication Guides as required.       DISEASE/MEDICATION-SPECIFIC INFORMATION        For patients on injectable medications: Patient currently has 0 doses left.  Next injection is scheduled for due-3/31 will take-4/1.    SPECIALTY MEDICATION ADHERENCE     Medication Adherence    Patient reported X missed doses in the last month: 0  Specialty Medication: HUMIRA(CF) 40 mg/0.4 mL injection (adalimumab)  Patient is on additional specialty medications: No              Were doses missed due to medication being on hold? No    Humira 40/0.4 mg/ml: 0 days of medicine on hand        REFERRAL TO PHARMACIST     Referral to the pharmacist: Not needed      [facility]     Shipping address confirmed in Epic.     Patient was notified of new phone menu : No    Delivery Scheduled: Yes, Expected medication delivery date: 06/24/22.     Medication will be delivered via Same Day Courier to the prescription address in Epic WAM.    Joshua Mccall   [facility] Specialty Technician

## 2022-06-24 MED FILL — HUMIRA SYRINGE CITRATE FREE 40 MG/0.4 ML: SUBCUTANEOUS | 28 days supply | Qty: 2 | Fill #10

## 2022-07-06 ENCOUNTER — Emergency Department
Admission: EM | Admit: 2022-07-06 | Discharge: 2022-07-06 | Disposition: A | Discharge status: Home / Self Care | Payer: BC Managed Care – PPO | Attending: Emergency Medicine | Admitting: Emergency Medicine

## 2022-07-06 ENCOUNTER — Other Ambulatory Visit: Payer: Self-pay

## 2022-07-06 ENCOUNTER — Emergency Department: Payer: BC Managed Care – PPO

## 2022-07-06 DIAGNOSIS — W4904XA Ring or other jewelry causing external constriction, initial encounter: Secondary | ICD-10-CM | POA: Diagnosis not present

## 2022-07-06 DIAGNOSIS — S61215A Laceration without foreign body of left ring finger without damage to nail, initial encounter: Secondary | ICD-10-CM | POA: Diagnosis not present

## 2022-07-06 MED ORDER — LIDOCAINE HCL (PF) 1 % IJ SOLN
5.0000 mL | Freq: Once | INTRAMUSCULAR | Status: AC
  Administered 2022-07-06: 5 mL via INTRADERMAL
  Filled 2022-07-06: qty 5

## 2022-07-06 MED ORDER — BUPIVACAINE HCL (PF) 0.5 % IJ SOLN
10.0000 mL | Freq: Once | INTRAMUSCULAR | Status: AC
  Administered 2022-07-06: 10 mL
  Filled 2022-07-06: qty 10

## 2022-07-06 NOTE — ED Provider Notes (Signed)
[facility] Provider Note    Event Date/Time   First MD Initiated Contact with Patient 07/06/22 1102     (approximate)   History   Laceration   HPI  Tyler Mcclure is a 62 y.o. male  with history of PE and as listed in EMR presents to the emergency department for treatment and evaluation of injury to left right finger. He got his wedding ring caught on the fence while mowing. He cut his ring off right away. Bleeding is well controlled. He believes his Tdap is current.       Physical Exam   Triage Vital Signs: ED Triage Vitals [07/06/22 1022]  Enc Vitals Group     BP (!) 132/97     Pulse Rate 93     Resp 16     Temp 98.7 F (37.1 C)     Temp Source Oral     SpO2 98 %     Weight      Height      Head Circumference      Peak Flow      Pain Score 3     Pain Loc      Pain Edu?      Excl. in GC?     Most recent vital signs: Vitals:   07/06/22 1022  BP: (!) 132/97  Pulse: 93  Resp: 16  Temp: 98.7 F (37.1 C)  SpO2: 98%    General: Awake, no distress.  CV:  Good peripheral perfusion.  Resp:  Normal effort.  Abd:  No distention.  Other:  Circumferential laceration to the left ring finger with controlled bleeding.  Patient able to demonstrate flexion and extension of the finger.   ED Results / Procedures / Treatments   Labs (all labs ordered are listed, but only abnormal results are displayed) Labs Reviewed - No data to display   EKG  Not indicated   RADIOLOGY  Image and radiology report reviewed and interpreted by me. Radiology report consistent with the same.  Image of the left ring finger negative for acute bony abnormality or retained foreign body.  PROCEDURES:  Critical Care performed: No  ..Laceration Repair  Date/Time: 07/06/2022 12:37 PM  Performed by: Chinita Pester, FNP Authorized by: Chinita Pester, FNP   Consent:    Consent obtained:  Verbal   Consent given by:  Patient   Risks discussed:   Infection, pain and poor wound healing Universal protocol:    Procedure explained and questions answered to patient or proxy's satisfaction: yes     Test results available: yes     Imaging studies available: yes   Anesthesia:    Anesthesia method:  Nerve block   Block needle gauge:  25 G   Block anesthetic:  Bupivacaine 0.5% w/o epi and lidocaine 1% w/o epi   Block injection procedure:  Anatomic landmarks identified   Block outcome:  Anesthesia achieved Laceration details:    Location:  Finger   Finger location:  L ring finger   Length (cm):  3 Pre-procedure details:    Preparation:  Patient was prepped and draped in usual sterile fashion Exploration:    Hemostasis achieved with:  Direct pressure   Imaging obtained: x-ray     Imaging outcome: foreign body not noted     Wound exploration: wound explored through full range of motion   Treatment:    Area cleansed with:  Povidone-iodine and saline   Irrigation method:  Syringe  Debridement:  None Skin repair:    Repair method:  Sutures   Number of sutures:  5 Approximation:    Approximation:  Close Repair type:    Repair type:  Complex Post-procedure details:    Dressing:  Non-adherent dressing and splint for protection   Procedure completion:  Tolerated well, no immediate complications    MEDICATIONS ORDERED IN ED:  Medications  lidocaine (PF) (XYLOCAINE) 1 % injection 5 mL (5 mLs Intradermal Given 07/06/22 1156)  bupivacaine(PF) (MARCAINE) 0.5 % injection 10 mL (10 mLs Infiltration Given 07/06/22 1156)     IMPRESSION / MDM / ASSESSMENT AND PLAN / ED COURSE   I have reviewed the triage note.  Differential diagnosis includes, but is not limited to, laceration, tendon injury, foreign body, fracture  Patient's presentation is most consistent with acute illness / injury with system symptoms.  62 year old male presenting to the emergency department for treatment and evaluation of laceration to the left ring finger after  getting it caught on a fence while mowing.  See HPI for further details.  Wound cleaned and repaired as described above.  Patient was able to demonstrate flexion and extension of the finger prior to and after repair.  He was placed in a static finger splint to protect sutures and allow healing.  Wound care instructions were provided.  He will follow-up with his primary care provider or go to urgent care in 10 to 12 days for suture removal or sooner for any sign or concern of infection.      FINAL CLINICAL IMPRESSION(S) / ED DIAGNOSES   Final diagnoses:  Laceration of left ring finger without foreign body without damage to nail, initial encounter     Rx / DC Orders   ED Discharge Orders     None        Note:  This document was prepared using Dragon voice recognition software and may include unintentional dictation errors.   Chinita Pester, FNP 07/06/22 1242    Minna Antis, MD 07/06/22 1933

## 2022-07-06 NOTE — ED Triage Notes (Signed)
Pt presents via POV c/o injury to left ring finger. Reports ring got caught in fence and peeled back skin on finger. Bleeding controlled.

## 2022-07-06 NOTE — Discharge Instructions (Signed)
Do not get the sutured area wet for 24 hours. After 24 hours, shower/bathe as usual and pat the area dry. Change the bandage 2 times per day and apply ointment. Leave open to air when at no risk of getting the area dirty, but cover at night before bed. See your PCP or go to Urgent Care in 10-12 days for suture removal or sooner for signs or concern of infection.

## 2022-07-12 NOTE — Unmapped (Signed)
[facility] Specialty Pharmacy Refill Coordination Note    Specialty Medication(s) to be Shipped:   Inflammatory Disorders: Humira    Other medication(s) to be shipped: No additional medications requested for fill at this time     Joshua Mccall, DOB: 02-04-1961  Phone: [phone] (home)       All above HIPAA information was verified with patient.     Was a Nurse, learning disability used for this call? No    Completed refill call assessment today to schedule patient's medication shipment from the [facility] [phone]).  All relevant notes have been reviewed.     Specialty medication(s) and dose(s) confirmed: Regimen is correct and unchanged.   Changes to medications: [name] reports no changes at this time.  Changes to insurance: No  New side effects reported not previously addressed with a pharmacist or physician: None reported  Questions for the pharmacist: No    Confirmed patient received a Conservation officer, historic buildings and a Surveyor, mining with first shipment. The patient will receive a drug information handout for each medication shipped and additional FDA Medication Guides as required.       DISEASE/MEDICATION-SPECIFIC INFORMATION        For patients on injectable medications: Patient currently has 0 doses left.  Next injection is scheduled for 07/21/22.    SPECIALTY MEDICATION ADHERENCE     Medication Adherence    Patient reported X missed doses in the last month: 0  Specialty Medication: HUMIRA(CF) 40 mg/0.4 mL injection (adalimumab)  Patient is on additional specialty medications: No  Patient is on more than two specialty medications: No  Any gaps in refill history greater than 2 weeks in the last 3 months: no  Demonstrates understanding of importance of adherence: yes              Were doses missed due to medication being on hold? No    HUMIRA(CF) 40   mg/ml: 0 days of medicine on hand       REFERRAL TO PHARMACIST     Referral to the pharmacist: Not needed      [facility]     Shipping address confirmed in Epic.       Delivery Scheduled: Yes, Expected medication delivery date: 07/16/22.     Medication will be delivered via Same Day Courier to the prescription address in Epic WAM.    Joshua Mccall   [facility] Specialty Technician

## 2022-07-16 MED FILL — HUMIRA SYRINGE CITRATE FREE 40 MG/0.4 ML: SUBCUTANEOUS | 28 days supply | Qty: 2 | Fill #11

## 2022-08-07 MED ORDER — HUMIRA SYRINGE CITRATE FREE 40 MG/0.4 ML
SUBCUTANEOUS | 3 refills | 84 days
Start: 2022-08-07 — End: 2023-08-07

## 2022-08-07 NOTE — Unmapped (Signed)
Humira refill  Last Visit Date: 04/18/2022  Next Visit Date: 10/17/2022    Lab Results   Component Value Date    ALT 42 09/22/2020    AST 26 09/22/2020    ALBUMIN 4.5 05/15/2020    CREATININE 0.78 09/22/2020     Lab Results   Component Value Date    WBC 5.7 09/22/2020    HGB 16.3 09/22/2020    HCT 45.7 09/22/2020    PLT 188 09/22/2020     Lab Results   Component Value Date    NEUTROPCT 40.1 09/22/2020    LYMPHOPCT 38.0 09/22/2020    MONOPCT 11.3 09/22/2020    EOSPCT 9.9 09/22/2020    BASOPCT 0.7 09/22/2020

## 2022-08-08 MED ORDER — HUMIRA SYRINGE CITRATE FREE 40 MG/0.4 ML
SUBCUTANEOUS | 3 refills | 84 days | Status: CP
Start: 2022-08-08 — End: 2023-08-08
  Filled 2022-08-15: qty 2, 28d supply, fill #0

## 2022-08-09 NOTE — Unmapped (Signed)
[facility] Special[facility] Pharm[facility] Refill Coordination Note    Specialty Medication(s) to be Shipped:   Inflammatory Disorders: Humira    Other medication(s) to be shipped: No additional medications requested for fill at this time     Joshua Mccall, DOB: 29-Apr-1960  Phone: [phone] (home)       All above HIPAA information was verified with patient.     Was a Nurse, learning disability used for this call? No    Completed refill call assessment today to schedule patient's medication shipment from the [facility] [phone]).  All relevant notes have been reviewed.     Specialty medication(s) and dose(s) confirmed: Regimen is correct and unchanged.   Changes to medications: Kenshin reports no changes at this time.  Changes to insurance: No  New side effects reported not previously addressed with a pharmacist or physician: None reported  Questions for the pharmacist: No    Confirmed patient received a Conservation officer, historic buildings and a Surveyor, mining with first shipment. The patient will receive a drug information handout for each medication shipped and additional FDA Medication Guides as required.       DISEASE/MEDICATION-SPECIFIC INFORMATION        For patients on injectable medications: Patient currently has 0 doses left.  Next injection is scheduled for 08/19/22.    SPECIALTY MEDICATION ADHERENCE     Medication Adherence    Patient reported X missed doses in the last month: 0  Specialty Medication: HUMIRA(CF) 40 mg/0.4 mL injection (adalimumab)  Patient is on additional specialty medications: No  Patient is on more than two specialty medications: No  Any gaps in refill history greater than 2 weeks in the last 3 months: no  Demonstrates understanding of importance of adherence: yes              Were doses missed due to medication being on hold? No    HUMIRA(CF) 40  mg/ml: 0 days of medicine on hand     REFERRAL TO PHARMACIST     Referral to the pharmacist: Not needed      [facility]     Shipping address confirmed in Epic.       Delivery Scheduled: Yes, Expected medication delivery date: 08/14/22.     Medication will be delivered via Same Day Courier to the prescription address in Epic WAM.    Moshe Salisbury   [facility] Specialty Technician

## 2022-09-03 NOTE — Unmapped (Signed)
[facility] Pharm[facility] Clinical Assessment & Refill Coordination Note    Joshua Mccall, DOB: 17-Jan-1961  Phone: [phone] (home)     All above HIPAA information was verified with patient.     Was a Nurse, learning disability used for this call? No    Specialty Medication(s):   Inflammatory Disorders: Humira     Current Outpatient Medications   Medication Sig Dispense Refill    aspirin (ECOTRIN) 81 MG tablet Take 1 tablet (81 mg total) by mouth.      betamethasone, augmented, (DIPROLENE) 0.05 % cream APPLY TO THE AFFECTED AREA ON ARMS TWICE DAILY AS NEEDED      calcipotriene (DOVONOX) 0.005 % ointment APPLY TWICE DAILY MONDAY-FRIDAY      cholecalciferol, vitamin D3-50 mcg, 2,000 unit,, 50 mcg (2,000 unit) cap Take 1 capsule (50 mcg total) by mouth.      desonide (DESOWEN) 0.05 % cream apply to affected area twice a day if needed      empty container Misc Use as directed 1 each 2    HUMIRA SYRINGE CITRATE FREE 40 MG/0.4 ML Inject the contents of 1 pen (40 mg total) under the skin every fourteen (14) days. 6 each 3    hydrocortisone 2.5 % cream Insert into the rectum.      multivitamin (TAB-A-VITE/THERAGRAN) per tablet Take 1 tablet by mouth.      omega 3-dha-epa-fish oil (FISH OIL) 100-160-1,000 mg cap Take 2 capsules by mouth.      PAXLOVID CO-PACK, EUA, (PAXLOVID CO-PACK, EUA,) 300 mg (150 mg x 2)-100 mg tablet See package instructions. (Patient not taking: Reported on 04/18/2022) 30 tablet 0    vitamin E-268 mg, 400 UNIT, 268 mg (400 UNIT) capsule Take 1 capsule (268 mg total) by mouth.       No current facility-administered medications for this visit.        Changes to medications: Garin reports no changes at this time.    Allergies   Allergen Reactions    Mercury     Penicillins        Changes to allergies: No    SPECIALTY MEDICATION ADHERENCE       Medication Adherence    Patient reported X missed doses in the last month: 0  Specialty Medication: Humira q14d  Patient is on additional specialty medications: No  Informant: patient          Specialty medication(s) dose(s) confirmed: Regimen is correct and unchanged.     Are there any concerns with adherence? No    Adherence counseling provided? Not needed    CLINICAL MANAGEMENT AND INTERVENTION      Clinical Benefit Assessment:    Do you feel the medicine is effective or helping your condition? Yes    Clinical Benefit counseling provided? Progress note from 04/18/22 shows evidence of clinical benefit    Adverse Effects Assessment:    Are you experiencing any side effects? No    Are you experiencing difficulty administering your medicine? No    Quality of Life Assessment:    Quality of Life    Rheumatology  1. What impact has your specialty medication had on the reduction of your daily pain level?: Tremendous  2. What impact has your specialty medication had on your ability to complete daily tasks (prepare meals, get dressed, etc...)?Marland Kitchen Tremendous  Oncology  Dermatology  Cystic Fibrosis          How many days over the past month did your condition  keep  you from your normal activities? For example, brushing your teeth or getting up in the morning. 0    Have you discussed this with your provider? Not needed    Acute Infection Status:    Acute infections noted within Epic:  No active infections  Patient reported infection: None    Therapy Appropriateness:    Is therapy appropriate and patient progressing towards therapeutic goals? Yes, therapy is appropriate and should be continued    DISEASE/MEDICATION-SPECIFIC INFORMATION      For patients on injectable medications: Patient currently has 0 doses left.  Next injection is scheduled for 6/30.    Chronic Inflammatory Diseases: Have you experienced any flares in the last month? No    PATIENT SPECIFIC NEEDS     Does the patient have any physical, cognitive, or cultural barriers? No    Is the patient high risk? No    Did the patient require a clinical intervention? No    Does the patient require physician intervention or other additional services (i.e., nutrition, smoking cessation, social work)? No    SOCIAL DETERMINANTS OF HEALTH     At the [facility], we have learned that life circumstances - like trouble affording food, housing, utilities, or transportation can affect the health of many of our patients.   That is why we wanted to ask: are you currently experiencing any life circumstances that are negatively impacting your health and/or quality of life? No    Social Determinants of Psychologist, prison and probation services Strain: Not on file   Internet Connectivity: Not on file   Food Insecurity: Not on file   Tobacco Use: Medium Risk (05/17/2022)    Received from [facility]    Patient History     Smoking Tobacco Use: Never     Smokeless Tobacco Use: Former     Passive Exposure: Not on file   Housing/Utilities: Not on file   Alcohol Use: Not on file   Transportation Needs: Not on file   Substance Use: Not on file   Health Literacy: Not on file   Physical Activity: Not on file   Interpersonal Safety: Not on file   Stress: Not on file   Intimate Partner Violence: Not on file   Depression: Not at risk (05/17/2022)    Received from [facility]    PHQ-2     Total Prescreening Score: 0   Social Connections: Not on file       Would you be willing to receive help with any of the needs that you have identified today? Not applicable       SHIPPING     Specialty Medication(s) to be Shipped:   Inflammatory Disorders: Humira    Other medication(s) to be shipped: No additional medications requested for fill at this time     Changes to insurance: No    Delivery Scheduled: Yes, Expected medication delivery date: 6/26.     Medication will be delivered via Same Day Courier to the confirmed prescription address in [facility].    The patient will receive a drug information handout for each medication shipped and additional FDA Medication Guides as required.  Verified that patient has previously received a Conservation officer, historic buildings and a Surveyor, mining.    The patient or caregiver noted above participated in the development of this care plan and knows that they can request review of or adjustments to the care plan at any time.  All of the patient's questions and concerns have been addressed.    Julianne Rice, PharmD   [facility] Specialty Pharmacist

## 2022-09-18 DIAGNOSIS — L4050 Arthropathic psoriasis, unspecified: Principal | ICD-10-CM

## 2022-09-18 DIAGNOSIS — L409 Psoriasis, unspecified: Principal | ICD-10-CM

## 2022-09-18 MED ORDER — ADALIMUMAB-ADAZ 40 MG/0.4 ML SUBCUTANEOUS SYRINGE
SUBCUTANEOUS | 3 refills | 0 days | Status: CP
Start: 2022-09-18 — End: ?
  Filled 2022-09-23: qty 0.8, 28d supply, fill #0

## 2022-09-18 NOTE — Unmapped (Signed)
[name] 's HUMIRA(CF) 40 mg/0.4 mL injection (adalimumab) shipment will be canceled  as a result of prior authorization being required by the patient's insurance. (Plan exclusion) provider has sent in new rx for Hyrimoz- PA has been submitted)    I have reached out to the patient  at [phone] and communicated the delay. We will not reschedule the medication and have removed this/these medication(s) from the work request.  We have canceled this work request.

## 2022-09-18 NOTE — Unmapped (Signed)
[facility] - PHARMACIST NOTES    Received message from insurance company that PA was approved for Humira, but needed patient to contact them at [phone] or at [email or web address] to set up an account.  Patient informed of this information and confirmed that they would call [employer] to finish approval.    Update: despite patient's completing the process, Humira is not a covered medication and [employer] will apply for free drug through the mfg assistance program. Per patient, he received a call from [facility] that there are preferred alternatives. Called [employer] and preferred alt is generic Hyrimoz or generic Cyltezo.  Discussed with patient and script for generic Hyrimoz sent to [facility] pharmacy.  Per [facility], pharmacy will receive a rejection upon initial test claim. This rejection will trigger [employer] to submit a PA automatically.      Chelsea Aus

## 2022-09-20 NOTE — Unmapped (Signed)
[facility]   Patient Onboarding/Medication Counseling    Insurance will cover generic of Hyrimoz.  Patient prefers syringes    Joshua Mccall is a 62 y.o. male with psoriatic arthritis who I am counseling today on continuation of therapy.  I am speaking to the patient.    Was a Nurse, learning disability used for this call? No    Verified patient's date of birth / HIPAA.    Specialty medication(s) to be sent: Inflammatory Disorders: adalimumab       Non-specialty medications/supplies to be sent: n/a      Medications not needed at this time: n/a       Adalimumab-adaz    The patient declined counseling on medication administration, missed dose instructions, goals of therapy, side effects and monitoring parameters, warnings and precautions, drug/food interactions, and storage, handling precautions, and disposal because they have taken the medication previously. The information in the declined sections below are for informational purposes only and was not discussed with patient.     Inject the contents of 1 syringe (40 mg) under the skin every fourteen (14) days.    Prefilled syringe   1. Gather all supplies needed for injection on a clean, flat working surface: medication syringe(s) removed from packaging, alcohol swab, sharps container, etc.  2. Look at the medication label - look for correct medication, correct dose, and check the expiration date  3. Look at the medication - the liquid in the syringe should appear clear and colorless  4. Lay the syringe on a flat surface and allow it to warm up to room temperature for at least 15- 30 minutes  5. Select injection site - you can use the front of your thigh or your belly (but not the area 2 inches around your belly button)  6. Prepare injection site - wash your hands and clean the skin at the injection site with an alcohol swab and let it air dry, do not touch the injection site again before the injection  7. Pull off the needle safety cap, do not remove until immediately prior to injection  8. Pinch the skin - with your hand not holding the syringe pinch up a fold of skin at the injection site using your forefinger and thumb  9. Insert the needle into the fold of skin at about a 45 degree angle - it's best to use a quick dart-like motion  10.Push the plunger down slowly as far as it will go until the syringe is empty, hold the syringe in place for a full 5 seconds  11. Check that the syringe is empty and pull the needle out at the same angle as inserted, remove thumb from plunger and the needle will retract and needle safety feature will cover needle  12. Dispose of the used syringe immediately in your sharps disposal container  13. If you see any blood at the injection site, press a cotton ball or gauze on the site and maintain pressure until the bleeding stops, do not rub the injection site    Current Medications (including OTC/herbals), Comorbidities and Allergies     Current Outpatient Medications   Medication Sig Dispense Refill    adalimumab-adaz 40 mg/0.4 mL Syrg Inject the contents of 1 syringe (40 mg) under the skin every fourteen (14) days. 6 mL 3    aspirin (ECOTRIN) 81 MG tablet Take 1 tablet (81 mg total) by mouth.      betamethasone, augmented, (DIPROLENE) 0.05 % cream APPLY TO THE AFFECTED  AREA ON ARMS TWICE DAILY AS NEEDED      calcipotriene (DOVONOX) 0.005 % ointment APPLY TWICE DAILY MONDAY-FRIDAY      cholecalciferol, vitamin D3-50 mcg, 2,000 unit,, 50 mcg (2,000 unit) cap Take 1 capsule (50 mcg total) by mouth.      desonide (DESOWEN) 0.05 % cream apply to affected area twice a day if needed      empty container Misc Use as directed 1 each 2    hydrocortisone 2.5 % cream Insert into the rectum.      multivitamin (TAB-A-VITE/THERAGRAN) per tablet Take 1 tablet by mouth.      omega 3-dha-epa-fish oil (FISH OIL) 100-160-1,000 mg cap Take 2 capsules by mouth.      PAXLOVID CO-PACK, EUA, (PAXLOVID CO-PACK, EUA,) 300 mg (150 mg x 2)-100 mg tablet See package instructions. (Patient not taking: Reported on 04/18/2022) 30 tablet 0    vitamin E-268 mg, 400 UNIT, 268 mg (400 UNIT) capsule Take 1 capsule (268 mg total) by mouth.       No current facility-administered medications for this visit.       Allergies   Allergen Reactions    Mercury     Penicillins        Patient Active Problem List   Diagnosis    Psoriasis    Psoriatic arthritis (CMS-HCC)       Reviewed and up to date in Epic.    Appropriateness of Therapy     Acute infections noted within Epic:  No active infections  Patient reported infection: None    Is medication and dose appropriate based on diagnosis and infection status? Yes    Prescription has been clinically reviewed: Yes      Baseline Quality of Life Assessment      How many days over the past month did your condition  keep you from your normal activities? For example, brushing your teeth or getting up in the morning. 0    Financial Information     Medication Assistance provided: Prior Authorization    Anticipated copay of $0 reviewed with patient. Verified delivery address.    Delivery Information     Scheduled delivery date: 7/1    Expected start date: 7/1      Medication will be delivered via Same Day Courier to the prescription address in [facility].  This shipment will not require a signature.      Explained the services we provide at [facility] and that each month we would call to set up refills.  Stressed importance of returning phone calls so that we could ensure they receive their medications in time each month.  Informed patient that we should be setting up refills 7-10 days prior to when they will run out of medication.  A pharmacist will reach out to perform a clinical assessment periodically.  Informed patient that a welcome packet, containing information about our pharmacy and other support services, a Notice of Privacy Practices, and a drug information handout will be sent.      The patient or caregiver noted above participated in the development of this care plan and knows that they can request review of or adjustments to the care plan at any time.      Patient or caregiver verbalized understanding of the above information as well as how to contact the pharmacy at [phone] option 4 with any questions/concerns.  The pharmacy is open Monday through Friday 8:30am-4:30pm.  A pharmacist is available 24/7 via  pager to answer any clinical questions they may have.    Patient Specific Needs     Does the patient have any physical, cognitive, or cultural barriers? No    Does the patient have adequate living arrangements? (i.e. the ability to store and take their medication appropriately) Yes    Did you identify any home environmental safety or security hazards? No    Patient prefers to have medications discussed with  Patient     Is the patient or caregiver able to read and understand education materials at a high school level or above? Yes    Patient's primary language is  English     Is the patient high risk? No    SOCIAL DETERMINANTS OF HEALTH     At the [facility], we have learned that life circumstances - like trouble affording food, housing, utilities, or transportation can affect the health of many of our patients.   That is why we wanted to ask: are you currently experiencing any life circumstances that are negatively impacting your health and/or quality of life? Patient declined to answer    Social Determinants of Health     Financial Resource Strain: Not on file   Internet Connectivity: Not on file   Food Insecurity: Not on file   Tobacco Use: Medium Risk (05/17/2022)    Received from [facility]    Patient History     Smoking Tobacco Use: Never     Smokeless Tobacco Use: Former     Passive Exposure: Not on file   Housing/Utilities: Not on file   Alcohol Use: Not on file   Transportation Needs: Not on file   Substance Use: Not on file   Health Literacy: Not on file   Physical Activity: Not on file   Interpersonal Safety: Not on file   Stress: Not on file   Intimate Partner Violence: Not on file   Depression: Not at risk (05/17/2022)    Received from [facility]    PHQ-2     Total Prescreening Score: 0   Social Connections: Not on file       Would you be willing to receive help with any of the needs that you have identified today? Not applicable       Julianne Rice, PharmD  [facility] Specialty Pharmacist

## 2022-09-25 NOTE — Unmapped (Signed)
[facility] Specialty Medication Onboarding    Specialty Medication: adalimumab-adaz 40 mg/0.4 mL Syrg  Prior Authorization: Approved   Financial Assistance: No - copay  <$25  Final Copay/Day Supply: $0 / 28 days    Insurance Restrictions: None     Notes to Pharmacist: This has already been filled and sent on 07/01 just adding on-boarding note to file   Credit Card on File: not applicable    The triage team has completed the benefits investigation and has determined that the patient is able to fill this medication at [facility]. Please contact the patient to complete the onboarding or follow up with the prescribing physician as needed.

## 2022-10-11 DIAGNOSIS — L4050 Arthropathic psoriasis, unspecified: Principal | ICD-10-CM

## 2022-10-11 MED ORDER — ADALIMUMAB-ADAZ 40 MG/0.4 ML SUBCUTANEOUS PEN INJECTOR
SUBCUTANEOUS | 2 refills | 0 days | Status: CP
Start: 2022-10-11 — End: ?

## 2022-10-11 NOTE — Unmapped (Signed)
[facility] Clinical Intervention    Type of intervention: Medication access     Medication involved: adalimumab    Problem identified: Generic Hyrimoz is preferred by patient's insurance but is on back order at [facility]    Intervention performed: From previous attempt to get Humira approval, new insurance informed us that generic Hyrimoz or generic Cyltezo were the only approved products. Both are unable to order from [facility] at this time as they are out of stock with the supplier.  As insurance hs previously denied Humira, clinic has sent RX to [facility] specialty pharmacy.  Patient is aware and will call [facility] today to schedule shipment and contact [facility]/clinic with any concerns.      Follow-up needed: 1 month to check stock of adalimumab-adaz.  If item is back in stock, patient would like to fill back at [facility].     Approximate time spent: 5-10 minutes    Clinical evidence used to support intervention: Chart review    Result of the intervention: Improved medication adherence    Julianne Rice, PharmD   [facility] Specialty Pharmacist

## 2022-10-11 NOTE — Unmapped (Signed)
[facility] RHEUMATOLOGY CLINIC - PHARMACIST NOTES    Per [facility] pharmacy, brand and generic Hyrimoz as well as Cyltezo are currently on backorder.   Have yet to hear this information from other pharmacy so in the meantime, will send Hyrimoz to [facility] in case they have some remaining medication. Still keeping script at the [facility] pharmacy active for now in case they receive more medications. Patient informed via Mychart message.    Chelsea Aus

## 2022-10-17 ENCOUNTER — Ambulatory Visit
Admit: 2022-10-17 | Discharge: 2022-10-18 | Payer: PRIVATE HEALTH INSURANCE | Attending: Student in an Organized Health Care Education/Training Program | Primary: Student in an Organized Health Care Education/Training Program

## 2022-10-17 DIAGNOSIS — L4050 Arthropathic psoriasis, unspecified: Principal | ICD-10-CM

## 2022-10-17 MED ORDER — MELOXICAM 15 MG TABLET
ORAL_TABLET | Freq: Every day | ORAL | 2 refills | 30 days | Status: CP
Start: 2022-10-17 — End: 2023-10-17

## 2022-10-17 NOTE — Unmapped (Signed)
Rheumatology Clinic Follow-up Note     Assessment/Plan:    Joshua Mccall is a 62 y.o.  male with a PMH of provoked PE, inverse psoriasis, and psoriatic arthritis with [place] evidence of tenosynovitis of the L 3rd toe and possible L sacroiliitis. He also has significant MSK symptoms related to prior injuries including rotator cuff tears requiring surgery, R biceps tendon tear requiring surgery, bilateral wrist fractures, ? knee injury. He comes into clinic today for follow up of PsA. He started Humira around 03/2020.     PsA - Inverse Psoriasis: Was doing well on Humira but unfortunately has had a lapse in therapy but should be getting Hyrimoz this week or next. Will see how he does with switch to this. We had previously discussed change in dosing to q7d when he was on Humira but he preferred to hold off on this.  - Cont adalimumab q14d    Return in about 6 months (around 04/19/2023).    Joshua Emmer, MD  Assistant Professor of Medicine  Department of Medicine/Division of Rheumatology  [facility]    Primary Care Provider: Pcp, Information Unavailable    HPI:  Joshua Mccall is a 62 y.o.  male with a PMH of provoked PE, inverse psoriasis, and psoriatic arthritis with [place] evidence of tenosynovitis of the L 3rd toe and possible L sacroiliitis. He also has significant MSK symptoms related to prior injuries including rotator cuff tears requiring surgery, R biceps tendon tear requiring surgery, bilateral wrist fractures, ? knee injury. He comes into clinic today for follow up of PsA. He started Humira around 03/2020.    Today  Unfortunately delayed in starting adalimumab biosimilar due to insurance issue. Having more pain, predominately in his feet related to this. Psoriasis has remained quiet. Otherwise well.    Disease History  Joshua Mccall has had inverse psoriasis since 2018-2019, with symptoms primarily in his groin region but more recently spreading to his wrist.  He was seen a dermatologist and had used topical corticosteroids which previously controlled his psoriasis. He notes he has chronic joint pain related to multiple injuries dating back as early as middle school. His prior injuries include bilateral rotator cuff disease with bilateral repairs.  He also had a right biceps tendon rupture with repair.  He also fractured both wrists. However, during 2021 he had worsening left third toe pain with a sensation of a marble under the foot and swelling with brownish-red discoloration, and tenosynovitis and arthritis seen on ultrasound.   In addition to his left third toe pain and swelling he also noted new right medial elbow pain and tenderness in 2021. He also had right lateral/posterior hip/buttocks pain that is worse in the morning and improves throughout the day, with possible left sarcoiliitis on xray.       Review of Systems:  Positive findings noted above, otherwise a 14 point review of systems was reviewed and negative    Past Medical, Surgical, Family and Social History reviewed and updated per EMR     Allergies:  Mercury and Penicillins    Medications:     Current Outpatient Medications:     adalimumab-adaz (HYRIMOZ,CF, PEN) 40 mg/0.4 mL PnIj, Inject 40 mg under the skin every fourteen (14) days., Disp: 2.4 mL, Rfl: 2    adalimumab-adaz 40 mg/0.4 mL Syrg, Inject the contents of 1 syringe (40 mg) under the skin every fourteen (14) days., Disp: 6 mL, Rfl: 3    cholecalciferol, vitamin D3-50 mcg, 2,000 unit,, 50  mcg (2,000 unit) cap, Take 1 capsule (50 mcg total) by mouth., Disp: , Rfl:     empty container Misc, Use as directed, Disp: 1 each, Rfl: 2    hydrocortisone 2.5 % cream, Insert into the rectum., Disp: , Rfl:     multivitamin (TAB-A-VITE/THERAGRAN) per tablet, Take 1 tablet by mouth., Disp: , Rfl:     omega 3-dha-epa-fish oil (FISH OIL) 100-160-1,000 mg cap, Take 2 capsules by mouth., Disp: , Rfl:     telmisartan (MICARDIS) 40 MG tablet, Take 1 tablet (40 mg total) by mouth daily., Disp: , Rfl:     vitamin E-268 mg, 400 UNIT, 268 mg (400 UNIT) capsule, Take 1 capsule (268 mg total) by mouth., Disp: , Rfl:     aspirin (ECOTRIN) 81 MG tablet, Take 1 tablet (81 mg total) by mouth. (Patient not taking: Reported on 10/17/2022), Disp: , Rfl:     betamethasone, augmented, (DIPROLENE) 0.05 % cream, APPLY TO THE AFFECTED AREA ON ARMS TWICE DAILY AS NEEDED (Patient not taking: Reported on 10/17/2022), Disp: , Rfl:     calcipotriene (DOVONOX) 0.005 % ointment, APPLY TWICE DAILY MONDAY-FRIDAY (Patient not taking: Reported on 10/17/2022), Disp: , Rfl:     desonide (DESOWEN) 0.05 % cream, apply to affected area twice a day if needed (Patient not taking: Reported on 10/17/2022), Disp: , Rfl:     meloxicam (MOBIC) 15 MG tablet, Take 1 tablet (15 mg total) by mouth daily., Disp: 30 tablet, Rfl: 2    PAXLOVID CO-PACK, EUA, (PAXLOVID CO-PACK, EUA,) 300 mg (150 mg x 2)-100 mg tablet, See package instructions. (Patient not taking: Reported on 04/18/2022), Disp: 30 tablet, Rfl: 0      Objective   Vitals:    10/17/22 0936   BP: 128/90   BP Site: L Arm   BP Position: Sitting   BP Cuff Size: Medium   Pulse: 88   Resp: 16   Temp: 37 ??C (98.6 ??F)   TempSrc: Temporal   Weight: 95.3 kg (210 lb 3.2 oz)   Height: 182.9 cm (6')         Physical Exam  General: well appearing, no acute distress  Eyes: EOMI, normal conjunctivae  ENT: MMM.  Oropharynx without any erythema or exudate.  No oral or nasal ulcers.  Neck: supple. No cervical lymphadenopathy  Cardiovascular: Regular rate and rhythm. No murmurs, rubs or gallops.   Pulmonary: Clear to auscultation bilaterally. Normal work of breathing.  Skin: No rashes seen.   Extremities: Warm and well perfused, no cyanosis, clubbing or edema  Musculoskeletal: Slight TTP on plantar aspect of L foot just lateral to 3rd MTP (stable from last visit). No bogginess/warmth of this joint or TTP on the dorsal aspect of this joint. No synovitis or tenderness to palpation in the hands, wrists, elbows, shoulders, knees, ankles, or feet. Slightly reduced ROM of bilateral wrists and elbows. Shoulders with slightly limited abduction to ~80 degrees. Full ROM throughout otherwise.   Neurologic: Cranial nerves grossly intact, strength 5/5 throughout.  Normal sensation  Psychiatric: Normal mood and affect.    I personally spent 32 minutes face-to-face and non-face-to-face in the care of this patient, which includes all pre, intra, and post visit time on the date of service.  All documented time was specific to the E/M visit and does not include any procedures that may have been performed.

## 2022-10-17 NOTE — Unmapped (Signed)
[facility] LVM needing pts pharm benefits info for Humira.  Faxed insurance card to [phone] confirmed

## 2022-10-29 DIAGNOSIS — L4050 Arthropathic psoriasis, unspecified: Principal | ICD-10-CM

## 2022-10-29 MED ORDER — ADALIMUMAB-ADAZ 40 MG/0.4 ML SUBCUTANEOUS SYRINGE
SUBCUTANEOUS | 3 refills | 84 days | Status: CP
Start: 2022-10-29 — End: ?
  Filled 2022-11-28: qty 0.8, 28d supply, fill #1

## 2022-10-29 NOTE — Unmapped (Signed)
Hyrimoz SYRINGE refill  Last Visit Date: 10/17/2022  Next Visit Date: 04/24/2023    Lab Results   Component Value Date    ALT 42 09/22/2020    AST 26 09/22/2020    ALBUMIN 4.5 05/15/2020    CREATININE 0.78 09/22/2020     Lab Results   Component Value Date    WBC 5.7 09/22/2020    HGB 16.3 09/22/2020    HCT 45.7 09/22/2020    PLT 188 09/22/2020     Lab Results   Component Value Date    NEUTROPCT 40.1 09/22/2020    LYMPHOPCT 38.0 09/22/2020    MONOPCT 11.3 09/22/2020    EOSPCT 9.9 09/22/2020    BASOPCT 0.7 09/22/2020

## 2022-11-07 NOTE — Unmapped (Signed)
Opened in error

## 2022-11-08 NOTE — Unmapped (Signed)
Specialty Medication(s): Hyrimoz    Joshua Mccall has been dis-enrolled from the [facility] specialty pharmacy services due to  patient filling at [facility] due to  inability to order product at [facility] .    Additional information provided to the patient: n/a    Julianne Rice, PharmD  [facility] Specialty Pharmacist

## 2022-11-27 NOTE — Unmapped (Signed)
[facility]   Patient Onboarding/Medication Counseling    Mr.Joshua Mccall is a 62 y.o. male with PsA who I am counseling today on continuation of therapy.  I am speaking to the patient.    Was a Nurse, learning disability used for this call? No    Verified patient's date of birth / HIPAA.    Specialty medication(s) to be sent: Inflammatory Disorders: adalimumab      Non-specialty medications/supplies to be sent: n/a      Medications not needed at this time: n/a         Hyrimoz (adalimumab-abad)    The patient declined counseling on medication administration, missed dose instructions, goals of therapy, side effects and monitoring parameters, warnings and precautions, drug/food interactions, and storage, handling precautions, and disposal because they have taken the medication previously. The information in the declined sections below are for informational purposes only and was not discussed with patient.     Medication & Administration     Dosage: Psoriatic arthritis: Inject 40mg  under the skin every 14 days    Lab tests required prior to treatment initiation:  Tuberculosis: Tuberculosis screening resulted in a non-reactive Quantiferon TB Gold assay.  Hepatitis B: Hepatitis B serology studies are complete and non-reactive.    Administration:     Prefilled syringe   1. Gather all supplies needed for injection on a clean, flat working surface: medication syringe(s) removed from packaging, alcohol swab, sharps container, etc.  2. Look at the medication label - look for correct medication, correct dose, and check the expiration date  3. Look at the medication - the liquid in the syringe should appear clear and colorless  4. Lay the syringe on a flat surface and allow it to warm up to room temperature for at least 15-30 minutes  5. Select injection site - you can use the front of your thigh or your belly (but not the area 2 inches around your belly button)  6. Prepare injection site - wash your hands and clean the skin at the injection site with an alcohol swab and let it air dry, do not touch the injection site again before the injection  7. Pull off the needle safety cap, do not remove until immediately prior to injection; turn the syringe so the needle is facing up; using your other hand, slowly push the plunger in to push the air out through the needle  8. Pinch the skin - with your hand not holding the syringe pinch up a fold of skin at the injection site using your forefinger and thumb  9. Insert the needle into the fold of skin at about a 45 degree angle - it's best to use a quick dart-like motion  10.Push the plunger down slowly as far as it will go until the syringe is empty, hold the syringe in place for a full 5 seconds  11. Check that the syringe is empty and pull the needle out at the same angle as inserted. Make sure that the needle has retracted.   12. Dispose of the used syringe immediately in your sharps disposal container, do not attempt to recap the needle prior to disposing  13. If you see any blood at the injection site, press a cotton ball or gauze on the site and maintain pressure until the bleeding stops, do not rub the injection site        Adherence/Missed dose instructions:  If your injection is given more than 3 days after your scheduled injection  date - consult your pharmacist for additional instructions on how to adjust your dosing schedule.    Goals of Therapy     - Achieve remission/inactive disease or low/minimal disease activity  - Maintenance of function  - Minimization of systemic manifestations and comorbidities  - Maintenance of effective psychosocial functioning      Side Effects & Monitoring Parameters     Injection site reaction (redness, irritation, inflammation localized to the site of administration)  Signs of a common cold - minor sore throat, runny or stuffy nose, etc.  Upset stomach  Headache  Rash    The following side effects should be reported to the provider:  Signs of a hypersensitivity reaction - rash; hives; itching; red, swollen, blistered, or peeling skin; wheezing; tightness in the chest or throat; difficulty breathing, swallowing, or talking; swelling of the mouth, face, lips, tongue, or throat; etc.  Reduced immune function - report signs of infection such as fever; chills; body aches; very bad sore throat; ear or sinus pain; cough; more sputum or change in color of sputum; pain with passing urine; wound that will not heal, etc.  Also at a slightly higher risk of some malignancies (mainly skin and blood cancers) due to this reduced immune function.  In the case of signs of infection - the patient should hold the next dose of Hadlima?? and call your primary care provider to ensure adequate medical care.  Treatment may be resumed when infection is treated and patient is asymptomatic.  Changes in skin - a new growth or lump that forms; changes in shape, size, or color of a previous mole or marking  Signs of unexplained bruising or bleeding - throwing up blood or emesis that looks like coffee grounds; black, tarry, or bloody stool; etc.  Signs of new or worsening heart failure - shortness of breath; sudden weight gain; heartbeat that is not normal; swelling in the arms or legs that is new or worse      Contraindications, Warnings, & Precautions     Have your bloodwork checked as you have been told by your prescriber  Talk with your doctor if you are pregnant, planning to become pregnant, or breastfeeding  Discuss the possible need for holding your dose(s) of Hadlima?? when a planned procedure is scheduled with the prescriber as it may delay healing/recovery timeline       Drug/Food Interactions     Medication list reviewed in Epic. The patient was instructed to inform the care team before taking any new medications or supplements. No drug interactions identified.   Talk with you prescriber or pharmacist before receiving any live vaccinations while taking this medication and after you stop taking it    Storage, Handling Precautions, & Disposal     Store this medication in the refrigerator.  Do not freeze  If needed, you may store at room temperature for up to 14 days  Store in original packaging, protected from light  Do not shake  Dispose of used syringes/pens in a sharps disposal container      Current Medications (including OTC/herbals), Comorbidities and Allergies     Current Outpatient Medications   Medication Sig Dispense Refill    adalimumab-adaz (HYRIMOZ,CF,) 40 mg/0.4 mL Syrg Inject 40 mg under the skin every fourteen (14) days. 2.4 mL 3    adalimumab-adaz 40 mg/0.4 mL Syrg Inject the contents of 1 syringe (40 mg) under the skin every fourteen (14) days. 6 mL 3    aspirin (ECOTRIN) 81 MG  tablet Take 1 tablet (81 mg total) by mouth. (Patient not taking: Reported on 10/17/2022)      betamethasone, augmented, (DIPROLENE) 0.05 % cream APPLY TO THE AFFECTED AREA ON ARMS TWICE DAILY AS NEEDED (Patient not taking: Reported on 10/17/2022)      calcipotriene (DOVONOX) 0.005 % ointment APPLY TWICE DAILY MONDAY-FRIDAY (Patient not taking: Reported on 10/17/2022)      cholecalciferol, vitamin D3-50 mcg, 2,000 unit,, 50 mcg (2,000 unit) cap Take 1 capsule (50 mcg total) by mouth.      desonide (DESOWEN) 0.05 % cream apply to affected area twice a day if needed (Patient not taking: Reported on 10/17/2022)      empty container Misc Use as directed 1 each 2    hydrocortisone 2.5 % cream Insert into the rectum.      meloxicam (MOBIC) 15 MG tablet Take 1 tablet (15 mg total) by mouth daily. 30 tablet 2    multivitamin (TAB-A-VITE/THERAGRAN) per tablet Take 1 tablet by mouth.      omega 3-dha-epa-fish oil (FISH OIL) 100-160-1,000 mg cap Take 2 capsules by mouth.      PAXLOVID CO-PACK, EUA, (PAXLOVID CO-PACK, EUA,) 300 mg (150 mg x 2)-100 mg tablet See package instructions. (Patient not taking: Reported on 04/18/2022) 30 tablet 0    telmisartan (MICARDIS) 40 MG tablet Take 1 tablet (40 mg total) by mouth daily.      vitamin E-268 mg, 400 UNIT, 268 mg (400 UNIT) capsule Take 1 capsule (268 mg total) by mouth.       No current facility-administered medications for this visit.       Allergies   Allergen Reactions    Mercury     Penicillins        Patient Active Problem List   Diagnosis    Psoriasis    Psoriatic arthritis (CMS-HCC)       Reviewed and up to date in Epic.    Appropriateness of Therapy     Acute infections noted within Epic:  No active infections  Patient reported infection: None    Is the medication and dose appropriate based on diagnosis, medication list, comorbidities, allergies, medical history, patient???s ability to self-administer the medication, and therapeutic goals? Yes    Prescription has been clinically reviewed: Yes      Baseline Quality of Life Assessment      How many days over the past month did your PsA  keep you from your normal activities? For example, brushing your teeth or getting up in the morning. Patient declined to answer    Financial Information     Medication Assistance provided: Prior Authorization    Anticipated copay of $0/28 days  reviewed with patient. Verified delivery address.    Delivery Information     Scheduled delivery date: 9/5    Expected start date: ASAP 9/5      Medication will be delivered via Same Day Courier to the prescription address in [facility].  This shipment will not require a signature.      Explained the services we provide at [facility] and that each month we would call to set up refills.  Stressed importance of returning phone calls so that we could ensure they receive their medications in time each month.  Informed patient that we should be setting up refills 7-10 days prior to when they will run out of medication.  A pharmacist will reach out to perform a clinical assessment periodically.  Informed patient that  a welcome packet, containing information about our pharmacy and other support services, a Notice of Privacy Practices, and a drug information handout will be sent.      The patient or caregiver noted above participated in the development of this care plan and knows that they can request review of or adjustments to the care plan at any time.      Patient or caregiver verbalized understanding of the above information as well as how to contact the pharmacy at [phone] option 4 with any questions/concerns.  The pharmacy is open Monday through Friday 8:30am-4:30pm.  A pharmacist is available 24/7 via pager to answer any clinical questions they may have.    Patient Specific Needs     Does the patient have any physical, cognitive, or cultural barriers? No    Does the patient have adequate living arrangements? (i.e. the ability to store and take their medication appropriately) Yes    Did you identify any home environmental safety or security hazards? No    Patient prefers to have medications discussed with  Patient     Is the patient or caregiver able to read and understand education materials at a high school level or above? Yes    Patient's primary language is  English     Is the patient high risk? No    SOCIAL DETERMINANTS OF HEALTH     At the [facility], we have learned that life circumstances - like trouble affording food, housing, utilities, or transportation can affect the health of many of our patients.   That is why we wanted to ask: are you currently experiencing any life circumstances that are negatively impacting your health and/or quality of life? No    Social Determinants of Health     Financial Resource Strain: Not on file   Internet Connectivity: Not on file   Food Insecurity: Not on file   Tobacco Use: Low Risk  (10/17/2022)    Patient History     Smoking Tobacco Use: Never     Smokeless Tobacco Use: Never     Passive Exposure: Not on file   Recent Concern: Tobacco Use - Medium Risk (09/10/2022)    Received from [facility]    Patient History     Smoking Tobacco Use: Never     Smokeless Tobacco Use: Former     Passive Exposure: Not on file   Housing/Utilities: Not on file   Alcohol Use: Not on file   Transportation Needs: Not on file   Substance Use: Not on file   Health Literacy: Not on file   Physical Activity: Not on file   Interpersonal Safety: Unknown (11/27/2022)    Interpersonal Safety     Unsafe Where You Currently Live: Not on file     Physically Hurt by Anyone: Not on file     Abused by Anyone: Not on file   Stress: Not on file   Intimate Partner Violence: Not on file   Depression: Not at risk (05/17/2022)    Received from [facility], [facility]    PHQ-2     (OBSOLETE) Total Prescreening Score: 0   Social Connections: Not on file       Would you be willing to receive help with any of the needs that you have identified today? Not applicable       Julianne Rice, PharmD  [facility] Specialty Pharmacist

## 2022-11-27 NOTE — Unmapped (Signed)
[facility] Specialty Medication Onboarding    Specialty Medication: adalimumab-adaz 40 mg/0.4 mL Syrg  Prior Authorization: Approved   Financial Assistance: No - copay  <$25  Final Copay/Day Supply: $0 / 28 days    Insurance Restrictions: None     Notes to Pharmacist: NOT DO DEFER - please let inventory know to order - worried claim will not process again- had issues billing   Credit Card on File: not applicable    The triage team has completed the benefits investigation and has determined that the patient is able to fill this medication at [facility]. Please contact the patient to complete the onboarding or follow up with the prescribing physician as needed.

## 2022-12-09 MED ORDER — EMPTY CONTAINER
2 refills | 0 days
Start: 2022-12-09 — End: ?

## 2022-12-09 NOTE — Unmapped (Signed)
[facility] Clinical Assessment & Refill Coordination Note    Joshua Mccall, DOB: 06/14/1960  Phone: [phone] (home)     All above HIPAA information was verified with patient.     Was a Nurse, learning disability used for this call? No    Specialty Medication(s):   Inflammatory Disorders: Adalimumab-adaz     Current Outpatient Medications   Medication Sig Dispense Refill    adalimumab-adaz (HYRIMOZ,CF,) 40 mg/0.4 mL Syrg Inject 40 mg under the skin every fourteen (14) days. 2.4 mL 3    adalimumab-adaz 40 mg/0.4 mL Syrg Inject the contents of 1 syringe (40 mg) under the skin every fourteen (14) days. 6 mL 3    aspirin (ECOTRIN) 81 MG tablet Take 1 tablet (81 mg total) by mouth. (Patient not taking: Reported on 10/17/2022)      betamethasone, augmented, (DIPROLENE) 0.05 % cream APPLY TO THE AFFECTED AREA ON ARMS TWICE DAILY AS NEEDED (Patient not taking: Reported on 10/17/2022)      calcipotriene (DOVONOX) 0.005 % ointment APPLY TWICE DAILY MONDAY-FRIDAY (Patient not taking: Reported on 10/17/2022)      cholecalciferol, vitamin D3-50 mcg, 2,000 unit,, 50 mcg (2,000 unit) cap Take 1 capsule (50 mcg total) by mouth.      desonide (DESOWEN) 0.05 % cream apply to affected area twice a day if needed (Patient not taking: Reported on 10/17/2022)      empty container Misc Use as directed 1 each 2    hydrocortisone 2.5 % cream Insert into the rectum.      meloxicam (MOBIC) 15 MG tablet Take 1 tablet (15 mg total) by mouth daily. 30 tablet 2    multivitamin (TAB-A-VITE/THERAGRAN) per tablet Take 1 tablet by mouth.      omega 3-dha-epa-fish oil (FISH OIL) 100-160-1,000 mg cap Take 2 capsules by mouth.      PAXLOVID CO-PACK, EUA, (PAXLOVID CO-PACK, EUA,) 300 mg (150 mg x 2)-100 mg tablet See package instructions. (Patient not taking: Reported on 04/18/2022) 30 tablet 0    telmisartan (MICARDIS) 40 MG tablet Take 1 tablet (40 mg total) by mouth daily.      vitamin E-268 mg, 400 UNIT, 268 mg (400 UNIT) capsule Take 1 capsule (268 mg total) by mouth.       No current facility-administered medications for this visit.        Changes to medications: Joshua Mccall reports no changes at this time.    Allergies   Allergen Reactions    Mercury     Penicillins        Changes to allergies: No    SPECIALTY MEDICATION ADHERENCE     Adalimumab-abaz 40  mg/0.69ml : 0 doses of medicine on hand       Medication Adherence    Patient reported X missed doses in the last month: 0  Specialty Medication: adalimumab-adaz 40 mg/0.4 mL Syrg          Specialty medication(s) dose(s) confirmed: Regimen is correct and unchanged.     Are there any concerns with adherence? No    Adherence counseling provided? Not needed    CLINICAL MANAGEMENT AND INTERVENTION      Clinical Benefit Assessment:    Do you feel the medicine is effective or helping your condition? Yes    Clinical Benefit counseling provided? Not needed    Adverse Effects Assessment:    Are you experiencing any side effects? No    Are you experiencing difficulty administering your medicine? No  Quality of Life Assessment:    Quality of Life    Rheumatology  1. What impact has your specialty medication had on the reduction of your daily pain level?: Some  2. What impact has your specialty medication had on your ability to complete daily tasks (prepare meals, get dressed, etc...)?: Some  Oncology  Dermatology  Cystic Fibrosis               Have you discussed this with your provider? Not needed    Acute Infection Status:    Acute infections noted within Epic:  No active infections  Patient reported infection: None    Therapy Appropriateness:    Is therapy appropriate based on current medication list, adverse reactions, adherence, clinical benefit and progress toward achieving therapeutic goals? Yes, therapy is appropriate and should be continued     DISEASE/MEDICATION-SPECIFIC INFORMATION      For patients on injectable medications: Patient currently has 0 doses left.  Next injection is scheduled for 9/29.    Chronic Inflammatory Diseases: Have you experienced any flares in the last month? No  Has this been reported to your provider? Not applicable    PATIENT SPECIFIC NEEDS     Does the patient have any physical, cognitive, or cultural barriers? No    Is the patient high risk? No    Did the patient require a clinical intervention? No    Does the patient require physician intervention or other additional services (i.e., nutrition, smoking cessation, social work)? No    SOCIAL DETERMINANTS OF HEALTH     At the [facility], we have learned that life circumstances - like trouble affording food, housing, utilities, or transportation can affect the health of many of our patients.   That is why we wanted to ask: are you currently experiencing any life circumstances that are negatively impacting your health and/or quality of life? Patient declined to answer    Social Determinants of Health     Financial Resource Strain: Not on file   Internet Connectivity: Not on file   Food Insecurity: Not on file   Tobacco Use: Low Risk  (10/17/2022)    Patient History     Smoking Tobacco Use: Never     Smokeless Tobacco Use: Never     Passive Exposure: Not on file   Recent Concern: Tobacco Use - Medium Risk (09/10/2022)    Received from [facility]    Patient History     Smoking Tobacco Use: Never     Smokeless Tobacco Use: Former     Passive Exposure: Not on file   Housing/Utilities: Not on file   Alcohol Use: Not on file   Transportation Needs: Not on file   Substance Use: Not on file   Health Literacy: Not on file   Physical Activity: Not on file   Interpersonal Safety: Unknown (12/09/2022)    Interpersonal Safety     Unsafe Where You Currently Live: Not on file     Physically Hurt by Anyone: Not on file     Abused by Anyone: Not on file   Stress: Not on file   Intimate Partner Violence: Not on file   Depression: Not at risk (05/17/2022)    Received from [facility], [facility]    PHQ-2     (OBSOLETE) Total Prescreening Score: 0   Social Connections: Not on file       Would you be willing to receive help with any  of the needs that you have identified today? Not applicable       SHIPPING     Specialty Medication(s) to be Shipped:   Inflammatory Disorders: Adalimumab-adaz    Other medication(s) to be shipped:  sharps kit     Changes to insurance: No    Delivery Scheduled: Yes, Expected medication delivery date: 9/25.     Medication will be delivered via Same Day Courier to the confirmed prescription address in [facility].    The patient will receive a drug information handout for each medication shipped and additional FDA Medication Guides as required.  Verified that patient has previously received a Conservation officer, historic buildings and a Surveyor, mining.    The patient or caregiver noted above participated in the development of this care plan and knows that they can request review of or adjustments to the care plan at any time.      All of the patient's questions and concerns have been addressed.    [name], PharmD   [facility] Specialty Pharmacist

## 2022-12-18 MED FILL — EMPTY CONTAINER: 120 days supply | Qty: 1 | Fill #0

## 2022-12-18 MED FILL — ADALIMUMAB-ADAZ 40 MG/0.4 ML SUBCUTANEOUS SYRINGE: SUBCUTANEOUS | 28 days supply | Qty: 0.8 | Fill #2

## 2022-12-20 ENCOUNTER — Other Ambulatory Visit: Payer: Self-pay

## 2022-12-20 ENCOUNTER — Encounter: Payer: Self-pay | Admitting: Gastroenterology

## 2022-12-20 ENCOUNTER — Ambulatory Visit: Payer: BC Managed Care – PPO | Admitting: General Practice

## 2022-12-20 ENCOUNTER — Encounter
Admission: RE | Disposition: A | Discharge status: Home / Self Care | Payer: Self-pay | Source: Ambulatory Visit | Attending: Gastroenterology

## 2022-12-20 ENCOUNTER — Ambulatory Visit
Admission: RE | Admit: 2022-12-20 | Discharge: 2022-12-20 | Disposition: A | Discharge status: Home / Self Care | Payer: BC Managed Care – PPO | Source: Ambulatory Visit | Attending: Gastroenterology | Admitting: Gastroenterology

## 2022-12-20 DIAGNOSIS — I10 Essential (primary) hypertension: Secondary | ICD-10-CM | POA: Insufficient documentation

## 2022-12-20 DIAGNOSIS — K640 First degree hemorrhoids: Secondary | ICD-10-CM | POA: Insufficient documentation

## 2022-12-20 DIAGNOSIS — K5730 Diverticulosis of large intestine without perforation or abscess without bleeding: Secondary | ICD-10-CM | POA: Insufficient documentation

## 2022-12-20 DIAGNOSIS — D123 Benign neoplasm of transverse colon: Secondary | ICD-10-CM | POA: Insufficient documentation

## 2022-12-20 DIAGNOSIS — Z1211 Encounter for screening for malignant neoplasm of colon: Secondary | ICD-10-CM | POA: Diagnosis present

## 2022-12-20 HISTORY — DX: Essential (primary) hypertension: I10

## 2022-12-20 HISTORY — PX: POLYPECTOMY: SHX5525

## 2022-12-20 HISTORY — PX: COLONOSCOPY WITH PROPOFOL: SHX5780

## 2022-12-20 SURGERY — COLONOSCOPY WITH PROPOFOL
Anesthesia: General

## 2022-12-20 MED ORDER — LIDOCAINE HCL (CARDIAC) PF 100 MG/5ML IV SOSY
PREFILLED_SYRINGE | INTRAVENOUS | Status: DC | PRN
  Administered 2022-12-20: 100 mg via INTRAVENOUS

## 2022-12-20 MED ORDER — PROPOFOL 10 MG/ML IV BOLUS
INTRAVENOUS | Status: DC | PRN
  Administered 2022-12-20: 120 ug/kg/min via INTRAVENOUS
  Administered 2022-12-20: 100 mg via INTRAVENOUS

## 2022-12-20 MED ORDER — LIDOCAINE HCL (PF) 2 % IJ SOLN
INTRAMUSCULAR | Status: AC
  Filled 2022-12-20: qty 5

## 2022-12-20 MED ORDER — SODIUM CHLORIDE 0.9 % IV SOLN: INTRAVENOUS | Status: DC

## 2022-12-20 MED ORDER — PROPOFOL 10 MG/ML IV BOLUS
INTRAVENOUS | Status: AC
  Filled 2022-12-20: qty 40

## 2022-12-20 NOTE — Anesthesia Postprocedure Evaluation (Signed)
Anesthesia Post Note  Patient: Tyler Mcclure  Procedure(s) Performed: COLONOSCOPY WITH PROPOFOL POLYPECTOMY  Patient location during evaluation: Endoscopy Anesthesia Type: General Level of consciousness: awake and alert Pain management: pain level controlled Vital Signs Assessment: post-procedure vital signs reviewed and stable Respiratory status: spontaneous breathing, nonlabored ventilation, respiratory function stable and patient connected to nasal cannula oxygen Cardiovascular status: blood pressure returned to baseline and stable Postop Assessment: no apparent nausea or vomiting Anesthetic complications: no  No notable events documented.   Last Vitals:  Vitals:   12/20/22 0754 12/20/22 0814  BP: 123/87   Pulse: 85   Resp:    Temp: (!) 36.1 C   SpO2: 97% 98%    Last Pain:  Vitals:   12/20/22 0814  TempSrc:   PainSc: 0-No pain                 Stephanie Coup

## 2022-12-20 NOTE — Op Note (Signed)
[facility] Gastroenterology Patient Name: Devere Brem Procedure Date: 12/20/2022 7:01 AM MRN: [id number] Account #: [id number] Date of Birth: Nov 10, 1960 Admit Type: Outpatient Age: 62 Room: [facility] ENDO ROOM 1 Gender: Male Note Status: Finalized Instrument Name: Colonoscope [id number] Procedure:             Colonoscopy Indications:           Screening for colorectal malignant neoplasm Providers:             [name] DO, DO Referring MD:          Ferdinand Lango. Sullivan Lone, MD (Referring MD) Medicines:             Monitored Anesthesia Care Complications:         No immediate complications. Estimated blood loss:                         Minimal. Procedure:             Pre-Anesthesia Assessment:                        - Prior to the procedure, a History and Physical was                         performed, and patient medications and allergies were                         reviewed. The patient is competent. The risks and                         benefits of the procedure and the sedation options and                         risks were discussed with the patient. All questions                         were answered and informed consent was obtained.                         Patient identification and proposed procedure were                         verified by the physician, the nurse, the anesthetist                         and the technician in the endoscopy suite. Mental                         Status Examination: alert and oriented. Airway                         Examination: normal oropharyngeal airway and neck                         mobility. Respiratory Examination: clear to                         auscultation. CV Examination: RRR, no murmurs, no S3  or S4. Prophylactic Antibiotics: The patient does not                         require prophylactic antibiotics. Prior                         Anticoagulants: The patient has taken no anticoagulant                          or antiplatelet agents. ASA Grade Assessment: II - A                         patient with mild systemic disease. After reviewing                         the risks and benefits, the patient was deemed in                         satisfactory condition to undergo the procedure. The                         anesthesia plan was to use monitored anesthesia care                         (MAC). Immediately prior to administration of                         medications, the patient was re-assessed for adequacy                         to receive sedatives. The heart rate, respiratory                         rate, oxygen saturations, blood pressure, adequacy of                         pulmonary ventilation, and response to care were                         monitored throughout the procedure. The physical                         status of the patient was re-assessed after the                         procedure.                        After obtaining informed consent, the colonoscope was                         passed under direct vision. Throughout the procedure,                         the patient's blood pressure, pulse, and oxygen                         saturations were monitored continuously. The  Colonoscope was introduced through the anus and                         advanced to the the terminal ileum, with                         identification of the appendiceal orifice and IC                         valve. The colonoscopy was performed without                         difficulty. The patient tolerated the procedure well.                         The quality of the bowel preparation was evaluated                         using the BBPS [facility] Bowel Preparation Scale) with                         scores of: Right Colon = 3, Transverse Colon = 3 and                         Left Colon = 3 (entire mucosa seen well with no                         residual  staining, small fragments of stool or opaque                         liquid). The total BBPS score equals 9. The terminal                         ileum, ileocecal valve, appendiceal orifice, and                         rectum were photographed. Findings:      The terminal ileum appeared normal. Estimated blood loss: none.      Retroflexion in the right colon was performed.      A 2 to 3 mm polyp was found in the transverse colon. The polyp was       sessile. The polyp was removed with a cold snare. Resection and       retrieval were complete. Estimated blood loss was minimal.      Multiple small-mouthed diverticula were found in the left colon.       Estimated blood loss: none.      Non-bleeding internal hemorrhoids were found during retroflexion. The       hemorrhoids were Grade I (internal hemorrhoids that do not prolapse).       Estimated blood loss: none.      The exam was otherwise without abnormality on direct and retroflexion       views. Impression:            - The examined portion of the ileum was normal.                        - One 2 to 3 mm polyp  in the transverse colon, removed                         with a cold snare. Resected and retrieved.                        - Diverticulosis in the left colon.                        - Non-bleeding internal hemorrhoids.                        - The examination was otherwise normal on direct and                         retroflexion views. Recommendation:        - Patient has a contact number available for                         emergencies. The signs and symptoms of potential                         delayed complications were discussed with the patient.                         Return to normal activities tomorrow. Written                         discharge instructions were provided to the patient.                        - Discharge patient to home.                        - Resume previous diet.                        - Continue  present medications.                        - Await pathology results.                        - Repeat colonoscopy for surveillance based on                         pathology results.                        - Return to referring physician as previously                         scheduled.                        - The findings and recommendations were discussed with                         the patient. Procedure Code(s):     --- Professional ---                        [phone],  Colonoscopy, flexible; with removal of                         tumor(s), polyp(s), or other lesion(s) by snare                         technique Diagnosis Code(s):     --- Professional ---                        Z12.11, Encounter for screening for malignant neoplasm                         of colon                        K64.0, First degree hemorrhoids                        D12.3, Benign neoplasm of transverse colon (hepatic                         flexure or splenic flexure)                        K57.30, Diverticulosis of large intestine without                         perforation or abscess without bleeding CPT copyright 2022 American Medical Association. All rights reserved. The codes documented in this report are preliminary and upon coder review may  be revised to meet current compliance requirements. Attending Participation:      I personally performed the entire procedure. Elfredia Nevins, DO [name] DO, DO 12/20/2022 7:58:06 AM This report has been signed electronically. Number of Addenda: 0 Note Initiated On: 12/20/2022 7:01 AM Scope Withdrawal Time: 0 hours 9 minutes 5 seconds  Total Procedure Duration: 0 hours 16 minutes 35 seconds  Estimated Blood Loss:  Estimated blood loss was minimal.      [facility]

## 2022-12-20 NOTE — Transfer of Care (Signed)
Immediate Anesthesia Transfer of Care Note  Patient: Tyler Mcclure  Procedure(s) Performed: COLONOSCOPY WITH PROPOFOL POLYPECTOMY  Patient Location: Endoscopy Unit  Anesthesia Type:General  Level of Consciousness: drowsy  Airway & Oxygen Therapy: Patient Spontanous Breathing  Post-op Assessment: Report given to RN and Post -op Vital signs reviewed and stable  Post vital signs: Reviewed and stable  Last Vitals:  Vitals Value Taken Time  BP 123/76 0755  Temp 35.9 0755  Pulse 85 12/20/22 0755  Resp 14 12/20/22 0755  SpO2 95 % 12/20/22 0755  Vitals shown include unfiled device data.  Last Pain:  Vitals:   12/20/22 0754  TempSrc:   PainSc: 0-No pain         Complications: No notable events documented.

## 2022-12-20 NOTE — Anesthesia Preprocedure Evaluation (Signed)
Anesthesia Evaluation  Patient identified by MRN, date of birth, ID band Patient awake    Reviewed: Allergy & Precautions, NPO status , Patient's Chart, lab work & pertinent test results  Airway Mallampati: II  TM Distance: >3 FB Neck ROM: Full    Dental  (+) Dental Advisory Given   Pulmonary neg pulmonary ROS   Pulmonary exam normal breath sounds clear to auscultation       Cardiovascular hypertension, negative cardio ROS Normal cardiovascular exam Rhythm:Regular Rate:Normal     Neuro/Psych negative neurological ROS  negative psych ROS   GI/Hepatic negative GI ROS, Neg liver ROS,,,  Endo/Other  negative endocrine ROS    Renal/GU negative Renal ROS  negative genitourinary   Musculoskeletal   Abdominal   Peds  Hematology negative hematology ROS (+)   Anesthesia Other Findings Past Medical History: No date: Arthritis No date: Hypertension No date: Pulmonary embolus [facility])  Past Surgical History: No date: BICEPS TENDON REPAIR 04/27/2015: RESECTION DISTAL CLAVICAL; Left     Comment:  Procedure: DISTAL CLAVICLE EXCISION;  Surgeon: Loreta Ave, MD;  Location: [facility];                Service: Orthopedics;  Laterality: Left; 04/27/2015: SHOULDER ARTHROSCOPY WITH ROTATOR CUFF REPAIR AND  SUBACROMIAL DECOMPRESSION; Left     Comment:  Procedure: LEFT SHOULDER ARTHROSCOPY WITH DEBRIDEMENT,               ACROMIOPLASTY, ROTATOR CUFF REPAIR;  Surgeon: Loreta Ave, MD;  Location: [facility];                Service: Orthopedics;  Laterality: Left; No date: SHOULDER SURGERY  BMI    Body Mass Index: 28.62 kg/m      Reproductive/Obstetrics negative OB ROS                             Anesthesia Physical Anesthesia Plan  ASA: 2  Anesthesia Plan: General   Post-op Pain Management: Minimal or no pain anticipated   Induction:  Intravenous  PONV Risk Score and Plan: 3 and Propofol infusion, TIVA and Ondansetron  Airway Management Planned: Nasal Cannula  Additional Equipment: None  Intra-op Plan:   Post-operative Plan:   Informed Consent: I have reviewed the patients History and Physical, chart, labs and discussed the procedure including the risks, benefits and alternatives for the proposed anesthesia with the patient or authorized representative who has indicated his/her understanding and acceptance.     Dental advisory given  Plan Discussed with: CRNA and Surgeon  Anesthesia Plan Comments: (Discussed risks of anesthesia with patient, including possibility of difficulty with spontaneous ventilation under anesthesia necessitating airway intervention, PONV, and rare risks such as cardiac or respiratory or neurological events, and allergic reactions. Discussed the role of CRNA in patient's perioperative care. Patient understands.)        Anesthesia Quick Evaluation

## 2022-12-20 NOTE — Interval H&P Note (Signed)
History and Physical Interval Note: Preprocedure H&P from 12/20/22  was reviewed and there was no interval change after seeing and examining the patient.  Written consent was obtained from the patient after discussion of risks, benefits, and alternatives. Patient has consented to proceed with Colonoscopy with possible intervention   12/20/2022 7:21 AM  Tyler Mcclure  has presented today for surgery, with the diagnosis of Z12.11 (ICD-10-CM) - Colon cancer screening.  The various methods of treatment have been discussed with the patient and family. After consideration of risks, benefits and other options for treatment, the patient has consented to  Procedure(s): COLONOSCOPY WITH PROPOFOL (N/A) as a surgical intervention.  The patient's history has been reviewed, patient examined, no change in status, stable for surgery.  I have reviewed the patient's chart and labs.  Questions were answered to the patient's satisfaction.     [name]

## 2022-12-20 NOTE — H&P (Signed)
Pre-Procedure H&P   Patient ID: Tyler Mcclure is a 62 y.o. male.  Gastroenterology Provider: [name], DO  Referring Provider: Dr. Sullivan Lone PCP: Bosie Clos, MD  Date: 12/20/2022  HPI Tyler Mcclure is a 62 y.o. male who presents today for Colonoscopy for Colorectal cancer screening .  Patient last underwent colonoscopy in December 2013 which was normal.  Undergoing colorectal cancer screening today.  No family history of colon cancer or colon polyps Reports daily bowel movement without melena or hematochezia Hemoglobin 16 MCV 91 platelets 170,000 creatinine 0.8   Past Medical History:  Diagnosis Date   Arthritis    Hypertension    Pulmonary embolus [facility])     Past Surgical History:  Procedure Laterality Date   BICEPS TENDON REPAIR     RESECTION DISTAL CLAVICAL Left 04/27/2015   Procedure: DISTAL CLAVICLE EXCISION;  Surgeon: Loreta Ave, MD;  Location: [facility];  Service: Orthopedics;  Laterality: Left;   SHOULDER ARTHROSCOPY WITH ROTATOR CUFF REPAIR AND SUBACROMIAL DECOMPRESSION Left 04/27/2015   Procedure: LEFT SHOULDER ARTHROSCOPY WITH DEBRIDEMENT, ACROMIOPLASTY, ROTATOR CUFF REPAIR;  Surgeon: Loreta Ave, MD;  Location: [facility];  Service: Orthopedics;  Laterality: Left;   SHOULDER SURGERY      Family History No h/o GI disease or malignancy  Review of Systems  Constitutional:  Negative for activity change, appetite change, chills, diaphoresis, fatigue, fever and unexpected weight change.  HENT:  Negative for trouble swallowing and voice change.   Respiratory:  Negative for shortness of breath and wheezing.   Cardiovascular:  Negative for chest pain, palpitations and leg swelling.  Gastrointestinal:  Negative for abdominal distention, abdominal pain, anal bleeding, blood in stool, constipation, diarrhea, nausea and vomiting.  Musculoskeletal:  Negative for arthralgias and myalgias.  Skin:  Negative for  color change and pallor.  Neurological:  Negative for dizziness, syncope and weakness.  Psychiatric/Behavioral:  Negative for confusion. The patient is not nervous/anxious.   All other systems reviewed and are negative.    Medications No current facility-administered medications on file prior to encounter.   Current Outpatient Medications on File Prior to Encounter  Medication Sig Dispense Refill   telmisartan (MICARDIS) 40 MG tablet Take 40 mg by mouth daily.     Adalimumab (HUMIRA PEN) 40 MG/0.4ML PNKT Inject into the skin.     aspirin EC 81 MG tablet Take 81 mg by mouth every other day.     Cholecalciferol (VITAMIN D) 2000 UNITS CAPS Take 1 capsule by mouth daily.     influenza vac split quadrivalent PF (FLUARIX QUADRIVALENT) 0.5 ML injection Inject into the muscle. 0.5 mL 0   influenza vac split quadrivalent PF (FLUARIX) 0.5 ML injection Inject into the muscle. 0.5 mL 0   MULTIPLE VITAMIN PO Take 1 tablet by mouth daily.     Omega-3 Fatty Acids (FISH OIL BURP-LESS) 1200 MG CAPS Take 2 capsules by mouth daily.     vitamin E 400 UNIT capsule Take 1 capsule by mouth daily.      Pertinent medications related to GI and procedure were reviewed by me with the patient prior to the procedure   Current Facility-Administered Medications:    0.9 %  sodium chloride infusion, , Intravenous, Continuous, [name], DO  sodium chloride         Allergies  Allergen Reactions   Mercury    Penicillins    Allergies were reviewed by me prior to the procedure  Objective   Body mass index is 28.62 kg/m. Vitals:   12/20/22 0702  BP: (!) 137/100  Pulse: 82  Resp: 18  Temp: 97.6 F (36.4 C)  TempSrc: Temporal  SpO2: 97%  Weight: 95.7 kg  Height: 6' (1.829 m)     Physical Exam Vitals and nursing note reviewed.  Constitutional:      General: He is not in acute distress.    Appearance: Normal appearance. He is not ill-appearing, toxic-appearing or diaphoretic.  HENT:      Head: Normocephalic and atraumatic.     Nose: Nose normal.     Mouth/Throat:     Mouth: Mucous membranes are moist.     Pharynx: Oropharynx is clear.  Eyes:     General: No scleral icterus.    Extraocular Movements: Extraocular movements intact.  Cardiovascular:     Rate and Rhythm: Normal rate and regular rhythm.     Heart sounds: Normal heart sounds. No murmur heard.    No friction rub. No gallop.  Pulmonary:     Effort: Pulmonary effort is normal. No respiratory distress.     Breath sounds: Normal breath sounds. No wheezing, rhonchi or rales.  Abdominal:     General: Bowel sounds are normal. There is no distension.     Palpations: Abdomen is soft.     Tenderness: There is no abdominal tenderness. There is no guarding or rebound.  Musculoskeletal:     Cervical back: Neck supple.     Right lower leg: No edema.     Left lower leg: No edema.  Skin:    General: Skin is warm and dry.     Coloration: Skin is not jaundiced or pale.  Neurological:     General: No focal deficit present.     Mental Status: He is alert and oriented to person, place, and time. Mental status is at baseline.  Psychiatric:        Mood and Affect: Mood normal.        Behavior: Behavior normal.        Thought Content: Thought content normal.        Judgment: Judgment normal.      Assessment:  Tyler Mcclure is a 62 y.o. male  who presents today for Colonoscopy for Colorectal cancer screening .  Plan:  Colonoscopy with possible intervention today  Colonoscopy with possible biopsy, control of bleeding, polypectomy, and interventions as necessary has been discussed with the patient/patient representative. Informed consent was obtained from the patient/patient representative after explaining the indication, nature, and risks of the procedure including but not limited to death, bleeding, perforation, missed neoplasm/lesions, cardiorespiratory compromise, and reaction to medications. Opportunity for  questions was given and appropriate answers were provided. Patient/patient representative has verbalized understanding is amenable to undergoing the procedure.   [name], DO  [facility] Gastroenterology  Portions of the record may have been created with voice recognition software. Occasional wrong-word or 'sound-a-like' substitutions may have occurred due to the inherent limitations of voice recognition software.  Read the chart carefully and recognize, using context, where substitutions may have occurred.

## 2022-12-23 ENCOUNTER — Encounter: Payer: Self-pay | Admitting: Gastroenterology

## 2022-12-23 LAB — SURGICAL PATHOLOGY

## 2023-01-02 ENCOUNTER — Other Ambulatory Visit: Payer: Self-pay

## 2023-01-02 DIAGNOSIS — Z1152 Encounter for screening for COVID-19: Secondary | ICD-10-CM | POA: Insufficient documentation

## 2023-01-02 DIAGNOSIS — D72829 Elevated white blood cell count, unspecified: Secondary | ICD-10-CM | POA: Diagnosis not present

## 2023-01-02 DIAGNOSIS — R109 Unspecified abdominal pain: Secondary | ICD-10-CM | POA: Diagnosis present

## 2023-01-02 DIAGNOSIS — K5732 Diverticulitis of large intestine without perforation or abscess without bleeding: Secondary | ICD-10-CM | POA: Diagnosis not present

## 2023-01-02 LAB — COMPREHENSIVE METABOLIC PANEL
ALT: 55 U/L — ABNORMAL HIGH (ref 0–44)
AST: 36 U/L (ref 15–41)
Albumin: 4.7 g/dL (ref 3.5–5.0)
Alkaline Phosphatase: 42 U/L (ref 38–126)
Anion gap: 15 (ref 5–15)
BUN: 14 mg/dL (ref 8–23)
CO2: 22 mmol/L (ref 22–32)
Calcium: 9.6 mg/dL (ref 8.9–10.3)
Chloride: 100 mmol/L (ref 98–111)
Creatinine, Ser: 0.75 mg/dL (ref 0.61–1.24)
GFR, Estimated: >60 mL/min (ref >60)
Glucose, Bld: 159 mg/dL — ABNORMAL HIGH (ref 70–99)
Potassium: 3.7 mmol/L (ref 3.5–5.1)
Sodium: 137 mmol/L (ref 135–145)
Total Bilirubin: 2.1 mg/dL — ABNORMAL HIGH (ref 0.3–1.2)
Total Protein: 8.5 g/dL — ABNORMAL HIGH (ref 6.5–8.1)

## 2023-01-02 LAB — CBC
HCT: 48.4 % (ref 39.0–52.0)
Hemoglobin: 17.1 g/dL — ABNORMAL HIGH (ref 13.0–17.0)
MCH: 31.4 pg (ref 26.0–34.0)
MCHC: 35.3 g/dL (ref 30.0–36.0)
MCV: 89 fL (ref 80.0–100.0)
Platelets: 196 K/uL (ref 150–400)
RBC: 5.44 MIL/uL (ref 4.22–5.81)
RDW: 11.8 % (ref 11.5–15.5)
WBC: 16.3 K/uL — ABNORMAL HIGH (ref 4.0–10.5)
nRBC: 0 % (ref 0.0–0.2)

## 2023-01-02 LAB — URINALYSIS, ROUTINE W REFLEX MICROSCOPIC
Bilirubin Urine: NEGATIVE
Glucose, UA: NEGATIVE mg/dL
Hgb urine dipstick: NEGATIVE
Ketones, ur: NEGATIVE mg/dL
Leukocytes,Ua: NEGATIVE
Nitrite: NEGATIVE
Protein, ur: NEGATIVE mg/dL
Specific Gravity, Urine: 1.002 — ABNORMAL LOW (ref 1.005–1.030)
pH: 6 (ref 5.0–8.0)

## 2023-01-02 LAB — LIPASE, BLOOD: Lipase: 34 U/L (ref 11–51)

## 2023-01-02 NOTE — ED Triage Notes (Signed)
Pt to ED via POV c/o lower abd pain that started this morning and has been going on all day. Pt also endorsing L. Flank pain Pt had diverticulitis 3 weeks ago and colonoscopy 2 weels ago. Pt temp at home was 101 about an hr ago, did not take any meds PTA.

## 2023-01-03 ENCOUNTER — Emergency Department
Admission: EM | Admit: 2023-01-03 | Discharge: 2023-01-03 | Disposition: A | Discharge status: Home / Self Care | Payer: PRIVATE HEALTH INSURANCE | Attending: Emergency Medicine | Admitting: Emergency Medicine

## 2023-01-03 ENCOUNTER — Emergency Department: Payer: PRIVATE HEALTH INSURANCE

## 2023-01-03 DIAGNOSIS — K5732 Diverticulitis of large intestine without perforation or abscess without bleeding: Secondary | ICD-10-CM

## 2023-01-03 LAB — SARS CORONAVIRUS 2 BY RT PCR: SARS Coronavirus 2 by RT PCR: NEGATIVE

## 2023-01-03 LAB — LACTIC ACID, PLASMA
Lactic Acid, Venous: 2.2 mmol/L (ref 0.5–1.9)
Lactic Acid, Venous: 2.1 mmol/L (ref 0.5–1.9)

## 2023-01-03 MED ORDER — SODIUM CHLORIDE 0.9 % IV SOLN
3.0000 g | Freq: Once | INTRAVENOUS | Status: AC
  Administered 2023-01-03: 3 g via INTRAVENOUS
  Filled 2023-01-03 (×2): qty 8

## 2023-01-03 MED ORDER — HYDROMORPHONE HCL 1 MG/ML IJ SOLN
0.5000 mg | Freq: Once | INTRAMUSCULAR | Status: AC
  Administered 2023-01-03: 0.5 mg via INTRAVENOUS
  Filled 2023-01-03: qty 0.5

## 2023-01-03 MED ORDER — AMOXICILLIN-POT CLAVULANATE 875-125 MG PO TABS: 1.0000 | ORAL_TABLET | Freq: Two times a day (BID) | ORAL | 0 refills | Status: AC

## 2023-01-03 MED ORDER — LACTATED RINGERS IV BOLUS
1000.0000 mL | Freq: Once | INTRAVENOUS | Status: AC
  Administered 2023-01-03: 1000 mL via INTRAVENOUS

## 2023-01-03 MED ORDER — IOHEXOL 300 MG/ML  SOLN
100.0000 mL | Freq: Once | INTRAMUSCULAR | Status: AC | PRN
  Administered 2023-01-03: 100 mL via INTRAVENOUS

## 2023-01-03 NOTE — Discharge Instructions (Addendum)
Augmentin antibiotics twice daily for 5 days.  Focus on staying hydrated with fluids, fiber and bowel rest  Use Tylenol for pain and fevers.  Up to 1000 mg per dose, up to 4 times per day.  Do not take more than 4000 mg of Tylenol/acetaminophen within 24 hours..  Use naproxen/Aleve for anti-inflammatory pain relief. Use up to 500mg  every 12 hours. Do not take more frequently than this. Do not use other NSAIDs (ibuprofen, Advil) while taking this medication. It is safe to take Tylenol with this.

## 2023-01-03 NOTE — ED Provider Notes (Signed)
[facility] Provider Note    Event Date/Time   First MD Initiated Contact with Patient 01/03/23 [phone]     (approximate)   History   Abdominal Pain   HPI  Tyler Mcclure is a 62 y.o. male who presents to the ED for evaluation of Abdominal Pain   Review of PCP visit from 1 month ago.  Seen for left-sided abdominal and flank pain.  Usually diagnosed with diverticulitis and started on Cipro and Flagyl. Also had an outpatient colonoscopy performed 2 weeks ago.  Single 2 mm polyp from the transverse colon resected.  Diverticulosis without active inflammation.  Internal hemorrhoids.  Patient presents for evaluation of LLQ abdominal pain and fever.  Symptoms just in the past 1 day  Physical Exam   Triage Vital Signs: ED Triage Vitals  Encounter Vitals Group     BP 01/02/23 2144 (!) 158/109     Systolic BP Percentile --      Diastolic BP Percentile --      Pulse Rate 01/02/23 2144 (!) 125     Resp 01/02/23 2144 18     Temp 01/02/23 2144 99.9 F (37.7 C)     Temp Source 01/02/23 2144 Oral     SpO2 01/02/23 2144 96 %     Weight 01/02/23 2142 210 lb (95.3 kg)     Height 01/02/23 2142 6' (1.829 m)     Head Circumference --      Peak Flow --      Pain Score 01/02/23 2142 5     Pain Loc --      Pain Education --      Exclude from Growth Chart --     Most recent vital signs: Vitals:   01/03/23 0200 01/03/23 0400  BP: (!) 152/94 (!) 144/98  Pulse: 97 92  Resp: 16 18  Temp: 99.5 F (37.5 C) 98.8 F (37.1 C)  SpO2: 98% 95%    General: Awake, no distress.  Well-appearing, pleasant and conversational CV:  Good peripheral perfusion.  Initially tachycardic, resolving with fluids Resp:  Normal effort.  Abd:  No distention.  Mild LLQ tenderness without peritoneal features.  Abdomen is otherwise benign. MSK:  No deformity noted.  Neuro:  No focal deficits appreciated. Other:     ED Results / Procedures / Treatments   Labs (all labs ordered are  listed, but only abnormal results are displayed) Labs Reviewed  COMPREHENSIVE METABOLIC PANEL - Abnormal; Notable for the following components:      Result Value   Glucose, Bld 159 (*)    Total Protein 8.5 (*)    ALT 55 (*)    Total Bilirubin 2.1 (*)    All other components within normal limits  CBC - Abnormal; Notable for the following components:   WBC 16.3 (*)    Hemoglobin 17.1 (*)    All other components within normal limits  URINALYSIS, ROUTINE W REFLEX MICROSCOPIC - Abnormal; Notable for the following components:   Color, Urine COLORLESS (*)    APPearance CLEAR (*)    Specific Gravity, Urine 1.002 (*)    All other components within normal limits  LACTIC ACID, PLASMA - Abnormal; Notable for the following components:   Lactic Acid, Venous 2.2 (*)    All other components within normal limits  LACTIC ACID, PLASMA - Abnormal; Notable for the following components:   Lactic Acid, Venous 2.1 (*)    All other components within normal limits  SARS CORONAVIRUS 2  BY RT PCR  CULTURE, BLOOD (SINGLE)  LIPASE, BLOOD    EKG   RADIOLOGY CT abdomen/pelvis interpreted by me with signs of diverticulitis without complicating features.  Official radiology report(s): CT ABDOMEN PELVIS W CONTRAST  Result Date: 01/03/2023 CLINICAL DATA:  History eval appy? diveritulitis , abscess? No surgical hx. diverticulitis a few weeks ago. Septic with lower abd pain today EXAM: CT ABDOMEN AND PELVIS WITH CONTRAST TECHNIQUE: Multidetector CT imaging of the abdomen and pelvis was performed using the standard protocol following bolus administration of intravenous contrast. RADIATION DOSE REDUCTION: This exam was performed according to the departmental dose-optimization program which includes automated exposure control, adjustment of the mA and/or kV according to patient size and/or use of iterative reconstruction technique. CONTRAST:  OMNIPAQUE IOHEXOL 300 MG/ML  SOLN COMPARISON:  CT abdomen 02/10/2017  FINDINGS: Lower chest: No acute abnormality. Hepatobiliary: Redemonstration of a 2 x 1.3 cm fluid density lesion within left hepatic lobe likely a simple hepatic cyst. No gallstones, gallbladder wall thickening, or pericholecystic fluid. No biliary dilatation. Pancreas: No focal lesion. Normal pancreatic contour. No surrounding inflammatory changes. No main pancreatic ductal dilatation. Spleen: Normal in size without focal abnormality. Adrenals/Urinary Tract: No adrenal nodule bilaterally. Bilateral kidneys enhance symmetrically. No hydronephrosis. No hydroureter. The urinary bladder is unremarkable. On delayed imaging, there is no urothelial wall thickening and there are no filling defects in the opacified portions of the bilateral collecting systems or ureters. Stomach/Bowel: Stomach is within normal limits. No evidence of small bowel wall thickening or dilatation. Colonic diverticulosis. Short segment distal descending/proximal sigmoid colon bowel wall thickening and pericolonic fat stranding. No intramural hematoma formation. Appendix appears normal. Vascular/Lymphatic: No abdominal aorta or iliac aneurysm. Mild atherosclerotic plaque of the aorta and its branches. No abdominal, pelvic, or inguinal lymphadenopathy. Reproductive: Prostate is unremarkable. Other: Trace free fluid within the left lower quadrant. No intraperitoneal free gas. No organized fluid collection. Musculoskeletal: Small fat containing left inguinal hernia. No suspicious lytic or blastic osseous lesions. No acute displaced fracture. Multilevel degenerative changes of the spine. IMPRESSION: 1. Colonic diverticulosis with uncomplicated acute diverticulitis of the distal descending colon/proximal sigmoid colon. Recommend colonoscopy status post treatment and status post complete resolution of inflammatory changes to exclude an underlying lesion. 2.  Aortic Atherosclerosis (ICD10-I70.0). Electronically Signed   By: Tish Frederickson M.D.   On:  01/03/2023 02:39    PROCEDURES and INTERVENTIONS:  .1-3 Lead EKG Interpretation  Performed by: Delton Prairie, MD Authorized by: Delton Prairie, MD     Interpretation: abnormal     ECG rate:  110   ECG rate assessment: tachycardic     Rhythm: sinus tachycardia     Ectopy: none     Conduction: normal   .Critical Care  Performed by: Delton Prairie, MD Authorized by: Delton Prairie, MD   Critical care provider statement:    Critical care time (minutes):  30   Critical care time was exclusive of:  Separately billable procedures and treating other patients   Critical care was necessary to treat or prevent imminent or life-threatening deterioration of the following conditions:  Sepsis   Critical care was time spent personally by me on the following activities:  Development of treatment plan with patient or surrogate, discussions with consultants, evaluation of patient's response to treatment, examination of patient, ordering and review of laboratory studies, ordering and review of radiographic studies, ordering and performing treatments and interventions, pulse oximetry, re-evaluation of patient's condition and review of old charts   Medications  lactated  ringers bolus 1,000 mL (0 mLs Intravenous Stopped 01/03/23 0159)  HYDROmorphone (DILAUDID) injection 0.5 mg (0.5 mg Intravenous Given 01/03/23 0113)  iohexol (OMNIPAQUE) 300 MG/ML solution 100 mL (100 mLs Intravenous Contrast Given 01/03/23 0118)  Ampicillin-Sulbactam (UNASYN) 3 g in sodium chloride 0.9 % 100 mL IVPB (0 g Intravenous Stopped 01/03/23 0417)     IMPRESSION / MDM / ASSESSMENT AND PLAN / ED COURSE  I reviewed the triage vital signs and the nursing notes.  Differential diagnosis includes, but is not limited to, diverticulitis, abscess or perforation, peritonitis, appendicitis, acute cystitis.  {Patient presents with symptoms of an acute illness or injury that is potentially life-threatening.  Patient presents with evidence of  sepsis from otherwise uncomplicated diverticulitis, suitable for trial of outpatient management with antibiotics.  Tachycardic but stable, resolving with IV fluids.  Leukocytosis is noted alongside an essentially normal metabolic pain, urinalysis and lipase.  Lactic acid is mildly elevated, downtrending with fluids.  CT, as above.  While I considered and discussed admission with the patient, we will trial outpatient management with Augmentin and close follow-up.  Clinical Course as of 01/03/23 0450  Fri Jan 03, 2023  0300 Reassessed, feeling well, tachycardia resolved.  We discussed diverticulitis and signs of sepsis.  He reports a desire to go home.  We discussed antibiotics, reassessment and possibly going home after this pending clinical course.  He is agreeable. [DS]  4098 Reassessed, answered questions [DS]  0439 Reassessed, sitting upright on the edge of the bed and looking well.  Lactic is downtrending and he is still wanting to go home.  I had offered admission but he is preferring outpatient management, which think is reasonable considering how well he looks.  We discussed antibiotics, PCP and GI follow-up, ED return precautions and answered questions. [DS]    Clinical Course User Index [DS] Delton Prairie, MD     FINAL CLINICAL IMPRESSION(S) / ED DIAGNOSES   Final diagnoses:  Diverticulitis of large intestine without perforation or abscess without bleeding     Rx / DC Orders   ED Discharge Orders          Ordered    amoxicillin-clavulanate (AUGMENTIN) 875-125 MG tablet  2 times daily        01/03/23 0301             Note:  This document was prepared using Dragon voice recognition software and may include unintentional dictation errors.   Delton Prairie, MD 01/03/23 [phone]

## 2023-01-08 LAB — CULTURE, BLOOD (SINGLE)
Culture: NO GROWTH
Special Requests: ADEQUATE

## 2023-01-09 NOTE — Unmapped (Signed)
[facility] Specialty and Home Delivery Pharmacy Refill Coordination Note    Specialty Medication(s) to be Shipped:   Inflammatory Disorders: Adalimumab-adaz    Other medication(s) to be shipped: No additional medications requested for fill at this time     Joshua Mccall, DOB: 1960/03/27  Phone: [phone] (home)       All above HIPAA information was verified with patient.     Was a Nurse, learning disability used for this call? No    Completed refill call assessment today to schedule patient's medication shipment from the [facility] and Home Delivery Pharmacy  [phone]).  All relevant notes have been reviewed.     Specialty medication(s) and dose(s) confirmed: Regimen is correct and unchanged.   Changes to medications: Joshua Mccall reports no changes at this time.  Changes to insurance: No  New side effects reported not previously addressed with a pharmacist or physician: None reported  Questions for the pharmacist: No    Confirmed patient received a Conservation officer, historic buildings and a Surveyor, mining with first shipment. The patient will receive a drug information handout for each medication shipped and additional FDA Medication Guides as required.       DISEASE/MEDICATION-SPECIFIC INFORMATION        For patients on injectable medications: Patient currently has 1 doses left.  Next injection is scheduled for 01/12/23.    SPECIALTY MEDICATION ADHERENCE     Medication Adherence    Patient reported X missed doses in the last month: 1  Specialty Medication: adalimumab-adaz 40 mg/0.4 mL Syrg  Patient is on additional specialty medications: No  Informant: patient              Were doses missed due to medication being on hold? No     adalimumab-adaz 40 mg/0.4 mL Syrg: 1 doses of medicine on hand       REFERRAL TO PHARMACIST     Referral to the pharmacist: Not needed      [facility]     Shipping address confirmed in Epic.       Delivery Scheduled: Yes, Expected medication delivery date: 01/16/23.     Medication will be delivered via Same Day Courier to the prescription address in Epic WAM.    Joshua Mccall   [facility] Specialty and Home Delivery Pharmacy  Specialty Technician

## 2023-01-16 MED FILL — ADALIMUMAB-ADAZ 40 MG/0.4 ML SUBCUTANEOUS SYRINGE: SUBCUTANEOUS | 28 days supply | Qty: 0.8 | Fill #3

## 2023-02-07 NOTE — Unmapped (Signed)
[facility] Specialty and Home Delivery Pharmacy Refill Coordination Note    Specialty Medication(s) to be Shipped:   adalimumab-adaz 40 mg/0.4 mL Syrg    Other medication(s) to be shipped: No additional medications requested for fill at this time     Joshua Mccall, DOB: 08-Mar-1961  Phone: [phone] (home)       All above HIPAA information was verified with patient.     Was a Nurse, learning disability used for this call? No    Completed refill call assessment today to schedule patient's medication shipment from the [facility] and Home Delivery Pharmacy  [phone]).  All relevant notes have been reviewed.     Specialty medication(s) and dose(s) confirmed: Regimen is correct and unchanged.   Changes to medications: Ashaan reports no changes at this time.  Changes to insurance: No  New side effects reported not previously addressed with a pharmacist or physician: None reported  Questions for the pharmacist: No    Confirmed patient received a Conservation officer, historic buildings and a Surveyor, mining with first shipment. The patient will receive a drug information handout for each medication shipped and additional FDA Medication Guides as required.       DISEASE/MEDICATION-SPECIFIC INFORMATION        For patients on injectable medications: Patient currently has 1 doses left.  Next injection is scheduled for 02/13/2023.    SPECIALTY MEDICATION ADHERENCE     Medication Adherence    Patient reported X missed doses in the last month: 0  Specialty Medication: adalimumab-adaz 40 mg/0.4 mL Syrg  Patient is on additional specialty medications: No              Were doses missed due to medication being on hold? No    adalimumab-adaz 40 mg/0.4 mL Syrg: 1 doses of medicine on hand     REFERRAL TO PHARMACIST     Referral to the pharmacist: Not needed      [facility]     Shipping address confirmed in Epic.       Delivery Scheduled: Yes, Expected medication delivery date: 02/14/2023.     Medication will be delivered via Same Day Courier to the prescription address in Epic WAM.    Dorisann Frames   [facility] Specialty and Home Delivery Pharmacy  Specialty Technician

## 2023-02-14 MED FILL — ADALIMUMAB-ADAZ 40 MG/0.4 ML SUBCUTANEOUS SYRINGE: SUBCUTANEOUS | 28 days supply | Qty: 0.8 | Fill #4

## 2023-03-11 NOTE — Unmapped (Signed)
[facility] Specialty and Home Delivery Pharmacy Refill Coordination Note    Specialty Medication(s) to be Shipped:   Inflammatory Disorders: Adalimumab-adaz    Other medication(s) to be shipped: No additional medications requested for fill at this time     Joshua Mccall, DOB: 1961/01/10  Phone: [phone] (home)       All above HIPAA information was verified with patient.     Was a Nurse, learning disability used for this call? No    Completed refill call assessment today to schedule patient's medication shipment from the [facility] and Home Delivery Pharmacy  [phone]).  All relevant notes have been reviewed.     Specialty medication(s) and dose(s) confirmed: Regimen is correct and unchanged.   Changes to medications: Joshua Mccall reports no changes at this time.  Changes to insurance: No  New side effects reported not previously addressed with a pharmacist or physician: None reported  Questions for the pharmacist: No    Confirmed patient received a Conservation officer, historic buildings and a Surveyor, mining with first shipment. The patient will receive a drug information handout for each medication shipped and additional FDA Medication Guides as required.       DISEASE/MEDICATION-SPECIFIC INFORMATION        For patients on injectable medications: Patient currently has 0 doses left.  Next injection is scheduled for 03/22/23.    SPECIALTY MEDICATION ADHERENCE     Medication Adherence    Patient reported X missed doses in the last month: 0  Specialty Medication: adalimumab-adaz 40 mg/0.4 mL Syrg  Informant: patient              Were doses missed due to medication being on hold? No     adalimumab-adaz 40 mg/0.4 mL Syrg : 0 days of medicine on hand       REFERRAL TO PHARMACIST     Referral to the pharmacist: Not needed      [facility]     Shipping address confirmed in Epic.       Delivery Scheduled: Yes, Expected medication delivery date: 03/14/23.     Medication will be delivered via Same Day Courier to the prescription address in Epic WAM.    Joshua Mccall   [facility] Specialty and Home Delivery Pharmacy  Specialty Technician

## 2023-03-14 MED FILL — ADALIMUMAB-ADAZ 40 MG/0.4 ML SUBCUTANEOUS SYRINGE: SUBCUTANEOUS | 28 days supply | Qty: 0.8 | Fill #5

## 2023-04-16 NOTE — Unmapped (Signed)
[facility] Specialty and Home Delivery Pharmacy Refill Coordination Note    Specialty Medication(s) to be Shipped:   Inflammatory Disorders: Adalimumab-adaz    Other medication(s) to be shipped: No additional medications requested for fill at this time     Joshua Mccall, DOB: 12-12-1960  Phone: [phone] (home)       All above HIPAA information was verified with patient.     Was a Nurse, learning disability used for this call? No    Completed refill call assessment today to schedule patient's medication shipment from the [facility] and Home Delivery Pharmacy  [phone]).  All relevant notes have been reviewed.     Specialty medication(s) and dose(s) confirmed: Regimen is correct and unchanged.   Changes to medications: Zaul reports no changes at this time.  Changes to insurance: No  New side effects reported not previously addressed with a pharmacist or physician: None reported  Questions for the pharmacist: No    Confirmed patient received a Conservation officer, historic buildings and a Surveyor, mining with first shipment. The patient will receive a drug information handout for each medication shipped and additional FDA Medication Guides as required.       DISEASE/MEDICATION-SPECIFIC INFORMATION        For patients on injectable medications: Patient currently has 0 doses left.  Next injection is scheduled for 1/24.    SPECIALTY MEDICATION ADHERENCE     Medication Adherence    Patient reported X missed doses in the last month: 0  Specialty Medication: adalimumab-adaz              Were doses missed due to medication being on hold? No    Adalimumab adaz 40/0.4 mg/ml: 0 days of medicine on hand       REFERRAL TO PHARMACIST     Referral to the pharmacist: Not needed      [facility]     Shipping address confirmed in Epic.       Delivery Scheduled: Yes, Expected medication delivery date: 1/23.     Medication will be delivered via Same Day Courier to the prescription address in Epic WAM.    Ruthine Dose, PharmD   [facility] Specialty and Home Delivery Pharmacy  Specialty Pharmacist

## 2023-04-17 MED FILL — ADALIMUMAB-ADAZ 40 MG/0.4 ML SUBCUTANEOUS SYRINGE: SUBCUTANEOUS | 28 days supply | Qty: 0.8 | Fill #6

## 2023-05-19 NOTE — Unmapped (Signed)
 [facility] Specialty and Home Delivery Pharmacy Refill Coordination Note    Specialty Medication(s) to be Shipped:   Inflammatory Disorders: Hyrimoz    Other medication(s) to be shipped: No additional medications requested for fill at this time     Joshua Mccall, DOB: 03-06-1961  Phone: [phone] (home)       All above HIPAA information was verified with patient.     Was a Nurse, learning disability used for this call? No    Completed refill call assessment today to schedule patient's medication shipment from the [facility] and Home Delivery Pharmacy  [phone]).  All relevant notes have been reviewed.     Specialty medication(s) and dose(s) confirmed: Regimen is correct and unchanged.   Changes to medications: Joshua Mccall reports no changes at this time.  Changes to insurance: No  New side effects reported not previously addressed with a pharmacist or physician: None reported  Questions for the pharmacist: No    Confirmed patient received a Conservation officer, historic buildings and a Surveyor, mining with first shipment. The patient will receive a drug information handout for each medication shipped and additional FDA Medication Guides as required.       DISEASE/MEDICATION-SPECIFIC INFORMATION        For patients on injectable medications: Patient currently has 0 doses left.  Next injection is scheduled for 05/23/23.    SPECIALTY MEDICATION ADHERENCE     Medication Adherence    Specialty Medication: adalimumab-adaz 40 mg/0.4 mL Syrg  Patient is on additional specialty medications: No              Were doses missed due to medication being on hold? No    adalimumab-adaz 40 mg/0.4 mL Syrg: 0 doses of medicine on hand       REFERRAL TO PHARMACIST     Referral to the pharmacist: Not needed      [facility]     Shipping address confirmed in Epic.       Delivery Scheduled: Yes, Expected medication delivery date: 05/22/23.     Medication will be delivered via Same Day Courier to the prescription address in Epic WAM.    Joshua Mccall   [facility] Specialty and Home Delivery Pharmacy  Specialty Technician

## 2023-05-22 MED FILL — ADALIMUMAB-ADAZ 40 MG/0.4 ML SUBCUTANEOUS SYRINGE: SUBCUTANEOUS | 28 days supply | Qty: 0.8 | Fill #7

## 2023-05-27 ENCOUNTER — Other Ambulatory Visit: Payer: Self-pay | Admitting: Gastroenterology

## 2023-05-27 DIAGNOSIS — R1032 Left lower quadrant pain: Secondary | ICD-10-CM

## 2023-05-27 DIAGNOSIS — K5792 Diverticulitis of intestine, part unspecified, without perforation or abscess without bleeding: Secondary | ICD-10-CM

## 2023-06-02 ENCOUNTER — Ambulatory Visit: Admission: RE | Admit: 2023-06-02 | Payer: PRIVATE HEALTH INSURANCE | Source: Ambulatory Visit

## 2023-06-04 ENCOUNTER — Encounter: Payer: Self-pay | Admitting: Gastroenterology

## 2023-06-17 ENCOUNTER — Ambulatory Visit
Admission: RE | Admit: 2023-06-17 | Discharge: 2023-06-17 | Disposition: A | Discharge status: Home / Self Care | Payer: PRIVATE HEALTH INSURANCE | Source: Ambulatory Visit | Attending: Gastroenterology

## 2023-06-17 DIAGNOSIS — R1032 Left lower quadrant pain: Secondary | ICD-10-CM

## 2023-06-17 DIAGNOSIS — K5792 Diverticulitis of intestine, part unspecified, without perforation or abscess without bleeding: Secondary | ICD-10-CM

## 2023-06-17 MED ORDER — IOPAMIDOL (ISOVUE-300) INJECTION 61%
100.0000 mL | Freq: Once | INTRAVENOUS | Status: AC | PRN
  Administered 2023-06-17: 100 mL via INTRAVENOUS

## 2023-06-19 NOTE — Unmapped (Signed)
 [facility] Specialty and Home Delivery Pharmacy Refill Coordination Note    Specialty Medication(s) to be Shipped:   Inflammatory Disorders: Adalimumab-adaz    Other medication(s) to be shipped: No additional medications requested for fill at this time     Joshua Mccall, DOB: 1960/06/12  Phone: [phone] (home)       All above HIPAA information was verified with patient.     Was a Nurse, learning disability used for this call? No    Completed refill call assessment today to schedule patient's medication shipment from the [facility] and Home Delivery Pharmacy  [phone]).  All relevant notes have been reviewed.     Specialty medication(s) and dose(s) confirmed: Regimen is correct and unchanged.   Changes to medications: Rance reports no changes at this time.  Changes to insurance: No  New side effects reported not previously addressed with a pharmacist or physician: None reported  Questions for the pharmacist: No    Confirmed patient received a Conservation officer, historic buildings and a Surveyor, mining with first shipment. The patient will receive a drug information handout for each medication shipped and additional FDA Medication Guides as required.       DISEASE/MEDICATION-SPECIFIC INFORMATION        For patients on injectable medications: Patient currently has 0 doses left.  Next injection is scheduled for 06/22/23.    SPECIALTY MEDICATION ADHERENCE     Medication Adherence    Patient reported X missed doses in the last month: 0  Specialty Medication: adalimumab-adaz 40 mg/0.4 mL Syrg  Patient is on additional specialty medications: No  Patient is on more than two specialty medications: No              Were doses missed due to medication being on hold? No    adalimumab-adaz 40 mg/0.4 mL Syrg  : 0 doses of medicine on hand         REFERRAL TO PHARMACIST     Referral to the pharmacist: Not needed      [facility]     Shipping address confirmed in Epic.     Cost and Payment: Patient has a $0 copay, payment information is not required.    Delivery Scheduled: Yes, Expected medication delivery date: 06/20/23.     Medication will be delivered via Same Day Courier to the prescription address in Epic WAM.    Joshua Mccall   [facility] Specialty and Home Delivery Pharmacy  Specialty Technician

## 2023-06-20 MED FILL — ADALIMUMAB-ADAZ 40 MG/0.4 ML SUBCUTANEOUS SYRINGE: SUBCUTANEOUS | 28 days supply | Qty: 0.8 | Fill #8

## 2023-07-16 ENCOUNTER — Ambulatory Visit
Admit: 2023-07-16 | Discharge: 2023-07-17 | Payer: PRIVATE HEALTH INSURANCE | Attending: Student in an Organized Health Care Education/Training Program | Primary: Student in an Organized Health Care Education/Training Program

## 2023-07-16 DIAGNOSIS — L4050 Arthropathic psoriasis, unspecified: Principal | ICD-10-CM

## 2023-07-16 DIAGNOSIS — L409 Psoriasis, unspecified: Principal | ICD-10-CM

## 2023-07-16 NOTE — Unmapped (Signed)
 Rheumatology Clinic Follow-up Note     Assessment/Plan:    Joshua Mccall is a 63 y.o.  male with a PMH of provoked PE, inverse psoriasis, and psoriatic arthritis with US  evidence of tenosynovitis of the L 3rd toe and possible L sacroiliitis. He also has significant MSK symptoms related to prior injuries including rotator cuff tears requiring surgery, R biceps tendon tear requiring surgery, bilateral wrist fractures, ? knee injury. He comes into clinic today for follow up of PsA. He started Humira  around 03/2020.     PsA - Inverse Psoriasis: Doing well today without disease activity seen. Continues to describe a wearing off effect, discussed checking antibodies as below. If negative then can discuss dose increase.  - Cont adalimumab  q14d  - Check adalimumab  Abs    Return in about 6 months (around 01/15/2024).    Christina Coyer, MD  Assistant Professor of Medicine  Department of Medicine/Division of Rheumatology  [facility]    Primary Care Provider: Pcp, Information Unavailable    HPI:  Joshua Mccall is a 63 y.o.  male with a PMH of provoked PE, inverse psoriasis, and psoriatic arthritis with US  evidence of tenosynovitis of the L 3rd toe and possible L sacroiliitis. He also has significant MSK symptoms related to prior injuries including rotator cuff tears requiring surgery, R biceps tendon tear requiring surgery, bilateral wrist fractures, ? knee injury. He comes into clinic today for follow up of PsA. He started Humira  around 03/2020.    Today  Getting calcification removed.    Partial femoral artery blocked. Started statin but had bad stomach pain and got sore. He stopped it a week ago and he felt fine a couple days.    Wearing off effect depends on the shot. Sometimes its good for all 14 days, sometimes it wears off after 10 days.    Disease History  Joshua Mccall has had inverse psoriasis since 2018-2019, with symptoms primarily in his groin region but more recently spreading to his wrist.  He was seen a dermatologist and had used topical corticosteroids which previously controlled his psoriasis. He notes he has chronic joint pain related to multiple injuries dating back as early as middle school. His prior injuries include bilateral rotator cuff disease with bilateral repairs.  He also had a right biceps tendon rupture with repair.  He also fractured both wrists. However, during 2021 he had worsening left third toe pain with a sensation of a marble under the foot and swelling with brownish-red discoloration, and tenosynovitis and arthritis seen on ultrasound.   In addition to his left third toe pain and swelling he also noted new right medial elbow pain and tenderness in 2021. He also had right lateral/posterior hip/buttocks pain that is worse in the morning and improves throughout the day, with possible left sarcoiliitis on xray.       Review of Systems:  Positive findings noted above, otherwise a 14 point review of systems was reviewed and negative    Past Medical, Surgical, Family and Social History reviewed and updated per EMR     Allergies:  Mercury and Penicillins    Medications:     Current Outpatient Medications:     adalimumab -adaz (HYRIMOZ ,CF,) 40 mg/0.4 mL Syrg, Inject 40 mg under the skin every fourteen (14) days., Disp: 2.4 mL, Rfl: 3    adalimumab -adaz 40 mg/0.4 mL Syrg, Inject the contents of 1 syringe (40 mg) under the skin every fourteen (14) days., Disp: 6 mL, Rfl: 3  aspirin (ECOTRIN) 81 MG tablet, Take 1 tablet (81 mg total) by mouth., Disp: , Rfl:     betamethasone, augmented, (DIPROLENE) 0.05 % cream, , Disp: , Rfl:     calcipotriene (DOVONOX) 0.005 % ointment, , Disp: , Rfl:     cholecalciferol, vitamin D3-50 mcg, 2,000 unit,, 50 mcg (2,000 unit) cap, Take 1 capsule (50 mcg total) by mouth., Disp: , Rfl:     desonide (DESOWEN) 0.05 % cream, , Disp: , Rfl:     empty container (SHARPS-A-GATOR DISPOSAL SYSTEM) Misc, Use as directed, Disp: 1 each, Rfl: 2    empty container Misc, Use as directed, Disp: 1 each, Rfl: 2    hydrocortisone 2.5 % cream, Insert into the rectum., Disp: , Rfl:     meloxicam  (MOBIC ) 15 MG tablet, Take 1 tablet (15 mg total) by mouth daily., Disp: 30 tablet, Rfl: 2    multivitamin (TAB-A-VITE/THERAGRAN) per tablet, Take 1 tablet by mouth., Disp: , Rfl:     omega 3-dha-epa-fish oil (FISH OIL) 100-160-1,000 mg cap, Take 2 capsules by mouth., Disp: , Rfl:     PAXLOVID  CO-PACK, EUA, (PAXLOVID  CO-PACK, EUA,) 300 mg (150 mg x 2)-100 mg tablet, See package instructions., Disp: 30 tablet, Rfl: 0    telmisartan (MICARDIS) 40 MG tablet, Take 1 tablet (40 mg total) by mouth daily., Disp: , Rfl:     vitamin E-268 mg, 400 UNIT, 268 mg (400 UNIT) capsule, Take 1 capsule (268 mg total) by mouth., Disp: , Rfl:       Objective   Vitals:    07/16/23 1133   BP: 159/91   BP Site: L Arm   BP Position: Sitting   BP Cuff Size: Large   Pulse: 85   Temp: 36.1 ??C (97 ??F)   TempSrc: Temporal   Weight: 99.7 kg (219 lb 12.8 oz)           Physical Exam  General: well appearing, no acute distress  Eyes: EOMI, normal conjunctivae  ENT: MMM.  Oropharynx without any erythema or exudate.  No oral or nasal ulcers.  Neck: supple. No cervical lymphadenopathy  Cardiovascular: Regular rate and rhythm. No murmurs, rubs or gallops.   Pulmonary: Clear to auscultation bilaterally. Normal work of breathing.  Skin: No rashes seen.   Extremities: Warm and well perfused, no cyanosis, clubbing or edema  Musculoskeletal: Slight TTP on plantar aspect of L foot just lateral to 3rd MTP (stable from last visit). No bogginess/warmth of this joint or TTP on the dorsal aspect of this joint. No synovitis or tenderness to palpation in the hands, wrists, elbows, shoulders, knees, ankles, or feet. Slightly reduced ROM of bilateral wrists and elbows. Shoulders with slightly limited abduction to ~80 degrees. Full ROM throughout otherwise.   Neurologic: Cranial nerves grossly intact, strength 5/5 throughout.  Normal sensation  Psychiatric: Normal mood and affect.    I personally spent 31 minutes face-to-face and non-face-to-face in the care of this patient, which includes all pre, intra, and post visit time on the date of service.  All documented time was specific to the E/M visit and does not include any procedures that may have been performed.

## 2023-07-16 NOTE — Unmapped (Signed)
 [facility] Refill Coordination Note    Specialty Medication(s) to be Shipped:   Inflammatory Disorders: Adalimumab -adaz    Other medication(s) to be shipped: No additional medications requested for fill at this time     Joshua Mccall, DOB: 1961/03/20  Phone: [phone] (home)       All above HIPAA information was verified with patient.     Was a Nurse, learning disability used for this call? No    Completed refill call assessment today to schedule patient's medication shipment from the [facility]  [phone]).  All relevant notes have been reviewed.     Specialty medication(s) and dose(s) confirmed: Regimen is correct and unchanged.   Changes to medications: Joshua Mccall reports no changes at this time.  Changes to insurance: No  New side effects reported not previously addressed with a pharmacist or physician: None reported  Questions for the pharmacist: No    Confirmed patient received a Conservation officer, historic buildings and a Surveyor, mining with first shipment. The patient will receive a drug information handout for each medication shipped and additional FDA Medication Guides as required.       DISEASE/MEDICATION-SPECIFIC INFORMATION        For patients on injectable medications: Patient currently has 0 doses left.  Next injection is scheduled for 4/26.    SPECIALTY MEDICATION ADHERENCE     Medication Adherence    Patient reported X missed doses in the last month: 0  Specialty Medication: adalimumab -adaz 40 mg/0.4 mL Syrg              Were doses missed due to medication being on hold? No    Adalimumab -adaz 40  mg/0.47ml : 0 doses of medicine on hand       REFERRAL TO PHARMACIST     Referral to the pharmacist: Not needed      SHIPPING     Shipping address confirmed in Epic.     Cost and Payment: Patient has a $0 copay, payment information is not required.    Delivery Scheduled: Yes, Expected medication delivery date: 4/25.     Medication will be delivered via Same Day Courier to the prescription address in Epic WAM.    Joshua Mccall, PharmD   [facility]  Specialty Pharmacist

## 2023-07-18 MED FILL — ADALIMUMAB-ADAZ 40 MG/0.4 ML SUBCUTANEOUS SYRINGE: SUBCUTANEOUS | 28 days supply | Qty: 0.8 | Fill #9

## 2023-07-29 ENCOUNTER — Inpatient Hospital Stay
Admission: EM | Admit: 2023-07-29 | Discharge: 2023-07-31 | DRG: 378 | Disposition: A | Discharge status: Home / Self Care | Payer: PRIVATE HEALTH INSURANCE | Attending: Internal Medicine | Admitting: Internal Medicine

## 2023-07-29 ENCOUNTER — Inpatient Hospital Stay: Payer: PRIVATE HEALTH INSURANCE | Admitting: Anesthesiology

## 2023-07-29 ENCOUNTER — Encounter
Admission: EM | Disposition: A | Discharge status: Home / Self Care | Payer: Self-pay | Source: Home / Self Care | Attending: Internal Medicine

## 2023-07-29 ENCOUNTER — Emergency Department: Payer: PRIVATE HEALTH INSURANCE

## 2023-07-29 ENCOUNTER — Encounter: Payer: Self-pay | Admitting: Emergency Medicine

## 2023-07-29 ENCOUNTER — Other Ambulatory Visit: Payer: Self-pay

## 2023-07-29 DIAGNOSIS — K3189 Other diseases of stomach and duodenum: Secondary | ICD-10-CM | POA: Diagnosis present

## 2023-07-29 DIAGNOSIS — Z888 Allergy status to other drugs, medicaments and biological substances status: Secondary | ICD-10-CM | POA: Diagnosis not present

## 2023-07-29 DIAGNOSIS — K5731 Diverticulosis of large intestine without perforation or abscess with bleeding: Principal | ICD-10-CM | POA: Diagnosis present

## 2023-07-29 DIAGNOSIS — Z79899 Other long term (current) drug therapy: Secondary | ICD-10-CM

## 2023-07-29 DIAGNOSIS — L409 Psoriasis, unspecified: Secondary | ICD-10-CM | POA: Diagnosis present

## 2023-07-29 DIAGNOSIS — Z86711 Personal history of pulmonary embolism: Secondary | ICD-10-CM

## 2023-07-29 DIAGNOSIS — Z83438 Family history of other disorder of lipoprotein metabolism and other lipidemia: Secondary | ICD-10-CM | POA: Diagnosis not present

## 2023-07-29 DIAGNOSIS — I2510 Atherosclerotic heart disease of native coronary artery without angina pectoris: Secondary | ICD-10-CM | POA: Diagnosis present

## 2023-07-29 DIAGNOSIS — E785 Hyperlipidemia, unspecified: Secondary | ICD-10-CM | POA: Diagnosis present

## 2023-07-29 DIAGNOSIS — Z7982 Long term (current) use of aspirin: Secondary | ICD-10-CM | POA: Diagnosis not present

## 2023-07-29 DIAGNOSIS — Z880 Allergy status to penicillin: Secondary | ICD-10-CM | POA: Diagnosis not present

## 2023-07-29 DIAGNOSIS — I10 Essential (primary) hypertension: Secondary | ICD-10-CM | POA: Diagnosis present

## 2023-07-29 DIAGNOSIS — D62 Acute posthemorrhagic anemia: Secondary | ICD-10-CM | POA: Diagnosis present

## 2023-07-29 DIAGNOSIS — L4050 Arthropathic psoriasis, unspecified: Secondary | ICD-10-CM | POA: Diagnosis present

## 2023-07-29 DIAGNOSIS — Z87891 Personal history of nicotine dependence: Secondary | ICD-10-CM | POA: Diagnosis not present

## 2023-07-29 DIAGNOSIS — K922 Gastrointestinal hemorrhage, unspecified: Secondary | ICD-10-CM | POA: Diagnosis present

## 2023-07-29 HISTORY — PX: ESOPHAGOGASTRODUODENOSCOPY: SHX5428

## 2023-07-29 LAB — COMPREHENSIVE METABOLIC PANEL WITH GFR
ALT: 37 U/L (ref 0–44)
AST: 28 U/L (ref 15–41)
Albumin: 3.7 g/dL (ref 3.5–5.0)
Alkaline Phosphatase: 28 U/L — ABNORMAL LOW (ref 38–126)
Anion gap: 9 (ref 5–15)
BUN: 15 mg/dL (ref 8–23)
CO2: 22 mmol/L (ref 22–32)
Calcium: 8.3 mg/dL — ABNORMAL LOW (ref 8.9–10.3)
Chloride: 107 mmol/L (ref 98–111)
Creatinine, Ser: 0.78 mg/dL (ref 0.61–1.24)
GFR, Estimated: >60 mL/min (ref >60)
Glucose, Bld: 148 mg/dL — ABNORMAL HIGH (ref 70–99)
Potassium: 3.8 mmol/L (ref 3.5–5.1)
Sodium: 138 mmol/L (ref 135–145)
Total Bilirubin: 0.9 mg/dL (ref 0.0–1.2)
Total Protein: 6.2 g/dL — ABNORMAL LOW (ref 6.5–8.1)

## 2023-07-29 LAB — CBC WITH DIFFERENTIAL/PLATELET
Abs Immature Granulocytes: 0.05 10*3/uL (ref 0.00–0.07)
Basophils Absolute: 0.1 10*3/uL (ref 0.0–0.1)
Basophils Relative: 1 %
Eosinophils Absolute: 0.2 10*3/uL (ref 0.0–0.5)
Eosinophils Relative: 3 %
HCT: 27.4 % — ABNORMAL LOW (ref 39.0–52.0)
Hemoglobin: 9.1 g/dL — ABNORMAL LOW (ref 13.0–17.0)
Immature Granulocytes: 1 %
Lymphocytes Relative: 45 %
Lymphs Abs: 2.8 10*3/uL (ref 0.7–4.0)
MCH: 31.8 pg (ref 26.0–34.0)
MCHC: 33.2 g/dL (ref 30.0–36.0)
MCV: 95.8 fL (ref 80.0–100.0)
Monocytes Absolute: 0.5 10*3/uL (ref 0.1–1.0)
Monocytes Relative: 8 %
Neutro Abs: 2.6 10*3/uL (ref 1.7–7.7)
Neutrophils Relative %: 42 %
Platelets: 202 10*3/uL (ref 150–400)
RBC: 2.86 MIL/uL — ABNORMAL LOW (ref 4.22–5.81)
RDW: 13.3 % (ref 11.5–15.5)
WBC: 6.2 10*3/uL (ref 4.0–10.5)
nRBC: 0.3 % — ABNORMAL HIGH (ref 0.0–0.2)

## 2023-07-29 LAB — HIV ANTIBODY (ROUTINE TESTING W REFLEX): HIV Screen 4th Generation wRfx: NONREACTIVE

## 2023-07-29 LAB — HEMOGLOBIN AND HEMATOCRIT, BLOOD
HCT: 26.8 % — ABNORMAL LOW (ref 39.0–52.0)
HCT: 28.1 % — ABNORMAL LOW (ref 39.0–52.0)
Hemoglobin: 9 g/dL — ABNORMAL LOW (ref 13.0–17.0)
Hemoglobin: 9.4 g/dL — ABNORMAL LOW (ref 13.0–17.0)

## 2023-07-29 LAB — IRON AND TIBC
Iron: 82 ug/dL (ref 45–182)
Saturation Ratios: 20 % (ref 17.9–39.5)
TIBC: 419 ug/dL (ref 250–450)
UIBC: 337 ug/dL

## 2023-07-29 LAB — PROTIME-INR
INR: 1 (ref 0.8–1.2)
Prothrombin Time: 13.7 s (ref 11.4–15.2)

## 2023-07-29 LAB — TYPE AND SCREEN
ABO/RH(D): A POS
Antibody Screen: NEGATIVE

## 2023-07-29 LAB — RETICULOCYTES
Immature Retic Fract: 33.8 % — ABNORMAL HIGH (ref 2.3–15.9)
RBC.: 2.85 MIL/uL — ABNORMAL LOW (ref 4.22–5.81)
Retic Count, Absolute: 196.1 10*3/uL — ABNORMAL HIGH (ref 19.0–186.0)
Retic Ct Pct: 6.9 % — ABNORMAL HIGH (ref 0.4–3.1)

## 2023-07-29 LAB — FERRITIN: Ferritin: 212 ng/mL (ref 24–336)

## 2023-07-29 SURGERY — EGD (ESOPHAGOGASTRODUODENOSCOPY)
Anesthesia: General

## 2023-07-29 MED ORDER — PEG 3350-KCL-NA BICARB-NACL 420 G PO SOLR
4000.0000 mL | Freq: Once | ORAL | Status: AC
  Administered 2023-07-29: 4000 mL via ORAL
  Filled 2023-07-29: qty 4000

## 2023-07-29 MED ORDER — ASPIRIN 81 MG PO TBEC: 81.0000 mg | DELAYED_RELEASE_TABLET | ORAL | Status: DC

## 2023-07-29 MED ORDER — GLYCOPYRROLATE 0.2 MG/ML IJ SOLN
INTRAMUSCULAR | Status: DC | PRN
  Administered 2023-07-29: .2 mg via INTRAVENOUS

## 2023-07-29 MED ORDER — ONDANSETRON HCL 4 MG/2ML IJ SOLN
4.0000 mg | Freq: Four times a day (QID) | INTRAMUSCULAR | Status: DC | PRN
Start: 2023-07-29 — End: 2023-07-31

## 2023-07-29 MED ORDER — DEXMEDETOMIDINE HCL IN NACL 80 MCG/20ML IV SOLN
INTRAVENOUS | Status: DC | PRN
Start: 2023-07-29 — End: 2023-07-29
  Administered 2023-07-29: 20 ug via INTRAVENOUS

## 2023-07-29 MED ORDER — IOHEXOL 350 MG/ML SOLN
100.0000 mL | Freq: Once | INTRAVENOUS | Status: AC | PRN
  Administered 2023-07-29: 100 mL via INTRAVENOUS

## 2023-07-29 MED ORDER — PHENYLEPHRINE 80 MCG/ML (10ML) SYRINGE FOR IV PUSH (FOR BLOOD PRESSURE SUPPORT)
PREFILLED_SYRINGE | INTRAVENOUS | Status: AC
  Filled 2023-07-29: qty 10

## 2023-07-29 MED ORDER — PROPOFOL 10 MG/ML IV BOLUS
INTRAVENOUS | Status: DC | PRN
  Administered 2023-07-29 (×2): 50 mg via INTRAVENOUS

## 2023-07-29 MED ORDER — ACETAMINOPHEN 325 MG PO TABS: 650.0000 mg | ORAL_TABLET | Freq: Four times a day (QID) | ORAL | Status: DC | PRN

## 2023-07-29 MED ORDER — HYDRALAZINE HCL 20 MG/ML IJ SOLN: 5.0000 mg | Freq: Four times a day (QID) | INTRAMUSCULAR | Status: DC | PRN

## 2023-07-29 MED ORDER — GLYCOPYRROLATE 0.2 MG/ML IJ SOLN
INTRAMUSCULAR | Status: AC
  Filled 2023-07-29: qty 2

## 2023-07-29 MED ORDER — ONDANSETRON HCL 4 MG PO TABS: 4.0000 mg | ORAL_TABLET | Freq: Four times a day (QID) | ORAL | Status: DC | PRN

## 2023-07-29 MED ORDER — PANTOPRAZOLE SODIUM 40 MG IV SOLR
40.0000 mg | Freq: Two times a day (BID) | INTRAVENOUS | Status: DC
  Administered 2023-07-29 – 2023-07-30 (×3): 40 mg via INTRAVENOUS
  Filled 2023-07-29 (×3): qty 10

## 2023-07-29 MED ORDER — ACETAMINOPHEN 650 MG RE SUPP: 650.0000 mg | Freq: Four times a day (QID) | RECTAL | Status: DC | PRN

## 2023-07-29 MED ORDER — LIDOCAINE HCL (CARDIAC) PF 100 MG/5ML IV SOSY
PREFILLED_SYRINGE | INTRAVENOUS | Status: DC | PRN
  Administered 2023-07-29: 80 mg via INTRAVENOUS

## 2023-07-29 MED ORDER — PROPOFOL 500 MG/50ML IV EMUL
INTRAVENOUS | Status: DC | PRN
  Administered 2023-07-29: 75 ug/kg/min via INTRAVENOUS

## 2023-07-29 MED ORDER — LIDOCAINE HCL (PF) 2 % IJ SOLN
INTRAMUSCULAR | Status: AC
Start: 2023-07-29 — End: ?
  Filled 2023-07-29: qty 5

## 2023-07-29 MED ORDER — SODIUM CHLORIDE 0.9 % IV SOLN: INTRAVENOUS | Status: DC

## 2023-07-29 NOTE — Op Note (Signed)
 [facility] Gastroenterology Patient Name: Tyler Mcclure Procedure Date: 07/29/2023 1:11 PM MRN: [id number] Account #: [id number] Date of Birth: 1960-12-24 Admit Type: Inpatient Age: 63 Room: [facility] ENDO ROOM 1 Gender: Male Note Status: Finalized Instrument Name: Upper Endoscope [id number] Procedure:             Upper GI endoscopy Indications:           Gastrointestinal bleeding of unknown origin Providers:             Leida Puna MD, MD Medicines:             Monitored Anesthesia Care Complications:         No immediate complications. Procedure:             Pre-Anesthesia Assessment:                        - Prior to the procedure, a History and Physical was                         performed, and patient medications and allergies were                         reviewed. The patient is competent. The risks and                         benefits of the procedure and the sedation options and                         risks were discussed with the patient. All questions                         were answered and informed consent was obtained.                         Patient identification and proposed procedure were                         verified by the physician, the nurse, the                         anesthesiologist, the anesthetist and the technician                         in the endoscopy suite. Mental Status Examination:                         alert and oriented. Airway Examination: normal                         oropharyngeal airway and neck mobility. Respiratory                         Examination: clear to auscultation. CV Examination:                         normal. Prophylactic Antibiotics: The patient does not                         require prophylactic antibiotics. Prior  Anticoagulants: The patient has taken no anticoagulant                         or antiplatelet agents. ASA Grade Assessment: III - A                         patient  with severe systemic disease. After reviewing                         the risks and benefits, the patient was deemed in                         satisfactory condition to undergo the procedure. The                         anesthesia plan was to use monitored anesthesia care                         (MAC). Immediately prior to administration of                         medications, the patient was re-assessed for adequacy                         to receive sedatives. The heart rate, respiratory                         rate, oxygen saturations, blood pressure, adequacy of                         pulmonary ventilation, and response to care were                         monitored throughout the procedure. The physical                         status of the patient was re-assessed after the                         procedure.                        After obtaining informed consent, the endoscope was                         passed under direct vision. Throughout the procedure,                         the patient's blood pressure, pulse, and oxygen                         saturations were monitored continuously. The Endoscope                         was introduced through the mouth, and advanced to the                         second part of duodenum. The upper GI endoscopy was  accomplished without difficulty. The patient tolerated                         the procedure well. Findings:      The examined esophagus was normal.      The entire examined stomach was normal.      Patchy moderately erythematous mucosa without active bleeding and with       no stigmata of bleeding was found in the duodenal bulb. Impression:            - Normal esophagus.                        - Normal stomach.                        - Erythematous duodenopathy.                        - No specimens collected. Recommendation:        - Return patient to hospital ward for ongoing care.                         - Clear liquid diet.                        - Continue present medications.                        - Will consider colonoscopy tomorrow. Likely will prep                         overnight and if bleeding stops then can cancel                         colonoscopy, if persistent, can proceed with                         colonoscopy. Procedure Code(s):     --- Professional ---                        [phone], Esophagogastroduodenoscopy, flexible,                         transoral; diagnostic, including collection of                         specimen(s) by brushing or washing, when performed                         (separate procedure) Diagnosis Code(s):     --- Professional ---                        K31.89, Other diseases of stomach and duodenum                        K92.2, Gastrointestinal hemorrhage, unspecified CPT copyright 2022 American Medical Association. All rights reserved. The codes documented in this report are preliminary and upon coder review may  be revised to meet current compliance requirements. Leida Puna MD, MD 07/29/2023 1:28:37 PM Number of Addenda: 0 Note Initiated On:  07/29/2023 1:11 PM Estimated Blood Loss:  Estimated blood loss: none.      [facility]

## 2023-07-29 NOTE — Transfer of Care (Signed)
 Immediate Anesthesia Transfer of Care Note  Patient: Tyler Mcclure  Procedure(s) Performed: EGD (ESOPHAGOGASTRODUODENOSCOPY)  Patient Location: PACU  Anesthesia Type:General  Level of Consciousness: sedated  Airway & Oxygen Therapy: Patient Spontanous Breathing  Post-op Assessment: Report given to RN and Post -op Vital signs reviewed and stable  Post vital signs: Reviewed and stable  Last Vitals:  Vitals Value Taken Time  BP 86/67 07/29/23 1329  Temp 36.1 C 07/29/23 1328  Pulse 110 07/29/23 1331  Resp 18 07/29/23 1331  SpO2 99 % 07/29/23 1331  Vitals shown include unfiled device data.  Last Pain:  Vitals:   07/29/23 1328  TempSrc: Temporal  PainSc: 0-No pain         Complications: No notable events documented.

## 2023-07-29 NOTE — H&P (Signed)
 History and Physical    Tyler Mcclure YQM:[id number] DOB: 10/20/1960 DOA: 07/29/2023  PCP: Nikki Barters, MD (Confirm with patient/family/NH records and if not entered, this has to be entered at [facility] point of entry) Patient coming from: Home  I have personally briefly reviewed patient's old medical records in [facility]  Chief Complaint: Rectal bleeding  HPI: Tyler Mcclure is a 63 y.o. male with medical history significant of psoriasis arthritis on DMARDs, HTN, remote history of provoked PE status post anticoagulation treatment x 3 months, diverticulosis/diverticulitis but no previous history of GI bleed, presented with recurrent rectal bleeding.  Patient reported that he had a history of colitis September last year with sudden onset abdominal pain but no lower GI bleed and he was treated with antibiotics and colonoscopy confirmed diverticulosis and 1 polyp.  Since then the patient has had intermittent left lower quadrant abdominal pain.  2 weeks ago patient was started on Crestor for HLD, after taking rosuvastatin for few days patient started develop severe constant epigastric pain and he stopped taking Crestor and PCP switched him to Zetia instead, and no more abdominal pain since.  Last week, patient started to have a flareup of joint pain and he took 3 days of Aleve , last Tuesday he started to have lower GI bleed with maroon-colored bloody diarrhea for 2 days, then stopped by self.  He denied any abdominal pain at that point.  Sunday, he took another Aleve  x 3 pills and then started to pass maroon-colored diarrhea overnight, 5-6 times, blood present denied any abdominal pain denied any chest pain shortness of breath lightheadedness or palpitations.  He drinks a beer or 2 on weekends. ED Course: Afebrile, borderline tachycardia, blood pressure 136/85 O2 sat 100% on room air.  Blood work showed hemoglobin 9.1 compared to baseline 16-17 November last year, BUN 15 creatinine 0.7 glucose 148  K3.8 bicarb 22.  CTA negative for active bleeding, diverticulosis.  Review of Systems: As per HPI otherwise 14 point review of systems negative.    Past Medical History:  Diagnosis Date   Arthritis    Hypertension    Pulmonary embolus [facility])     Past Surgical History:  Procedure Laterality Date   BICEPS TENDON REPAIR     COLONOSCOPY WITH PROPOFOL  N/A 12/20/2022   Procedure: COLONOSCOPY WITH PROPOFOL ;  Surgeon: Quintin Buckle, DO;  Location: [facility] ENDOSCOPY;  Service: Gastroenterology;  Laterality: N/A;   POLYPECTOMY  12/20/2022   Procedure: POLYPECTOMY;  Surgeon: Quintin Buckle, DO;  Location: [facility] ENDOSCOPY;  Service: Gastroenterology;;   RESECTION DISTAL CLAVICAL Left 04/27/2015   Procedure: DISTAL CLAVICLE EXCISION;  Surgeon: Ferd Householder, MD;  Location: [facility];  Service: Orthopedics;  Laterality: Left;   SHOULDER ARTHROSCOPY WITH ROTATOR CUFF REPAIR AND SUBACROMIAL DECOMPRESSION Left 04/27/2015   Procedure: LEFT SHOULDER ARTHROSCOPY WITH DEBRIDEMENT, ACROMIOPLASTY, ROTATOR CUFF REPAIR;  Surgeon: Ferd Householder, MD;  Location: [facility];  Service: Orthopedics;  Laterality: Left;   SHOULDER SURGERY       reports that he has never smoked. He quit smokeless tobacco use about 9 years ago. He reports that he does not drink alcohol and does not use drugs.  Allergies  Allergen Reactions   Mercury    Penicillins     Other Reaction(s): Unknown   Phenylmercuric Nitrate     Other Reaction(s): Unknown    Family History  Problem Relation Age of Onset   Breast cancer Mother    Hyperlipidemia  Mother    Pancreatic cancer Father    Healthy Sister    Healthy Daughter    Healthy Son      Prior to Admission medications   Medication Sig Start Date End Date Taking? Authorizing Provider  Adalimumab (HUMIRA PEN) 40 MG/0.4ML PNKT Inject into the skin. 05/15/20   [provider]  aspirin EC 81 MG tablet Take 81 mg by mouth every other  day.    [provider]  Cholecalciferol (VITAMIN D) 2000 UNITS CAPS Take 1 capsule by mouth daily.    [provider]  influenza vac split quadrivalent PF (FLUARIX QUADRIVALENT ) 0.5 ML injection Inject into the muscle. 01/01/22   Liane Redman, MD  influenza vac split quadrivalent PF (FLUARIX) 0.5 ML injection Inject into the muscle. 01/03/21   Liane Redman, MD  MULTIPLE VITAMIN PO Take 1 tablet by mouth daily.    [provider]  Omega-3 Fatty Acids (FISH OIL BURP-LESS) 1200 MG CAPS Take 2 capsules by mouth daily.    [provider]  telmisartan (MICARDIS) 40 MG tablet Take 40 mg by mouth daily.    [provider]  vitamin E 400 UNIT capsule Take 1 capsule by mouth daily.    [provider]    Physical Exam: Vitals:   07/29/23 0549 07/29/23 0553 07/29/23 0800  BP:  136/85 138/80  Pulse:  (!) 103 95  Resp:  18   Temp:  98.1 F (36.7 C)   TempSrc:  Oral   SpO2:  100% 100%  Weight: 97.5 kg    Height: 6' (1.829 m)      Constitutional: NAD, calm, comfortable Vitals:   07/29/23 0549 07/29/23 0553 07/29/23 0800  BP:  136/85 138/80  Pulse:  (!) 103 95  Resp:  18   Temp:  98.1 F (36.7 C)   TempSrc:  Oral   SpO2:  100% 100%  Weight: 97.5 kg    Height: 6' (1.829 m)     Eyes: PERRL, lids and conjunctivae normal ENMT: Mucous membranes are moist. Posterior pharynx clear of any exudate or lesions.Normal dentition.  Neck: normal, supple, no masses, no thyromegaly Respiratory: clear to auscultation bilaterally, no wheezing, no crackles. Normal respiratory effort. No accessory muscle use.  Cardiovascular: Regular rate and rhythm, no murmurs / rubs / gallops. No extremity edema. 2+ pedal pulses. No carotid bruits.  Abdomen: no tenderness, no masses palpated. No hepatosplenomegaly. Bowel sounds positive.  Musculoskeletal: no clubbing / cyanosis. No joint deformity upper and lower extremities. Good ROM, no contractures. Normal muscle  tone.  Skin: no rashes, lesions, ulcers. No induration Neurologic: CN 2-12 grossly intact. Sensation intact, DTR normal. Strength 5/5 in all 4.  Psychiatric: Normal judgment and insight. Alert and oriented x 3. Normal mood.     Labs on Admission: I have personally reviewed following labs and imaging studies  CBC: Recent Labs  Lab 07/29/23 0624  WBC 6.2  NEUTROABS 2.6  HGB 9.1*  HCT 27.4*  MCV 95.8  PLT 202   Basic Metabolic Panel: Recent Labs  Lab 07/29/23 0624  NA 138  K 3.8  CL 107  CO2 22  GLUCOSE 148*  BUN 15  CREATININE 0.78  CALCIUM 8.3*   GFR: Estimated Creatinine Clearance: 115.9 mL/min (by C-G formula based on SCr of 0.78 mg/dL). Liver Function Tests: Recent Labs  Lab 07/29/23 0624  AST 28  ALT 37  ALKPHOS 28*  BILITOT 0.9  PROT 6.2*  ALBUMIN 3.7   No results for input(s): "  LIPASE", "AMYLASE" in the last 168 hours. No results for input(s): "AMMONIA" in the last 168 hours. Coagulation Profile: Recent Labs  Lab 07/29/23 0624  INR 1.0   Cardiac Enzymes: No results for input(s): "CKTOTAL", "CKMB", "CKMBINDEX", "TROPONINI" in the last 168 hours. BNP (last 3 results) No results for input(s): "PROBNP" in the last 8760 hours. HbA1C: No results for input(s): "HGBA1C" in the last 72 hours. CBG: No results for input(s): "GLUCAP" in the last 168 hours. Lipid Profile: No results for input(s): "CHOL", "HDL", "LDLCALC", "TRIG", "CHOLHDL", "LDLDIRECT" in the last 72 hours. Thyroid  Function Tests: No results for input(s): "TSH", "T4TOTAL", "FREET4", "T3FREE", "THYROIDAB" in the last 72 hours. Anemia Panel: No results for input(s): "VITAMINB12", "FOLATE", "FERRITIN", "TIBC", "IRON", "RETICCTPCT" in the last 72 hours. Urine analysis:    Component Value Date/Time   COLORURINE COLORLESS (A) 01/02/2023 2148   APPEARANCEUR CLEAR (A) 01/02/2023 2148   LABSPEC 1.002 (L) 01/02/2023 2148   PHURINE 6.0 01/02/2023 2148   GLUCOSEU NEGATIVE 01/02/2023 2148    HGBUR NEGATIVE 01/02/2023 2148   BILIRUBINUR NEGATIVE 01/02/2023 2148   BILIRUBINUR Negative 01/05/2020 1016   KETONESUR NEGATIVE 01/02/2023 2148   PROTEINUR NEGATIVE 01/02/2023 2148   UROBILINOGEN 0.2 01/05/2020 1016   NITRITE NEGATIVE 01/02/2023 2148   LEUKOCYTESUR NEGATIVE 01/02/2023 2148    Radiological Exams on Admission: CT Angio Abd/Pel W and/or Wo Contrast Result Date: 07/29/2023 CLINICAL DATA:  Lower GI bleeding EXAM: CTA ABDOMEN AND PELVIS WITHOUT AND WITH CONTRAST TECHNIQUE: Multidetector CT imaging of the abdomen and pelvis was performed using the standard protocol during bolus administration of intravenous contrast. Multiplanar reconstructed images and MIPs were obtained and reviewed to evaluate the vascular anatomy. RADIATION DOSE REDUCTION: This exam was performed according to the departmental dose-optimization program which includes automated exposure control, adjustment of the mA and/or kV according to patient size and/or use of iterative reconstruction technique. CONTRAST:  OMNIPAQUE  IOHEXOL  350 MG/ML SOLN COMPARISON:  Abdomen and pelvis CT 06/17/2023 FINDINGS: VASCULAR Aorta: Scattered atheromatous calcification. No dissection or aneurysm Celiac: No stenosis, beading, or aneurysm. No evidence of active bleeding. SMA: Unremarkable.  No source for bleeding Renals: Unremarkable IMA: Patent with no explanation for bleeding. Inflow: Tortuosity.  No stenosis or dissection Proximal Outflow: Unremarkable Veins: Unremarkable Review of the MIP images confirms the above findings. NON-VASCULAR Lower chest:  No contributory findings. Hepatobiliary: Hepatic steatosis. Non worrisome left lobe cyst. No evidence of biliary obstruction or stone. Pancreas: Unremarkable. Spleen: Unremarkable. Adrenals/Urinary Tract: Negative adrenals. No hydronephrosis or stone. Unremarkable bladder. Stomach/Bowel: High-density within the stomach and distal colonic diverticula is present on the pre contrast. No  intraluminal hemorrhage detected. No bowel obstruction or visible inflammation. Sigmoid diverticulosis, moderately extensive. Vascular/Lymphatic: No acute vascular abnormality. No mass or adenopathy. Reproductive:No pathologic findings. Other: No ascites or pneumoperitoneum. Musculoskeletal: No acute abnormalities. Lumbar spine degeneration with L5-S1 ankylosis. IMPRESSION: No localized source or active bleeding by CTA Sigmoid diverticulosis. Hepatic steatosis and mild atherosclerosis. Electronically Signed   By: Ronnette Coke M.D.   On: 07/29/2023 08:10    EKG: None  Assessment/Plan Principal Problem:   Lower GI bleed Active Problems:   GI bleed  (please populate well all problems here in Problem List. (For example, if patient is on BP meds at home and you resume or decide to hold them, it is a problem that needs to be her. Same for CAD, COPD, HLD and so on)   Hematochezia Acute blood loss anemia - Clinically suspect diverticulosis bleeding given the intermittent bleeding  pattern.  However upper GI bleed cannot be ruled out given that the patient had some epigastric abdominal pain after taking Crestor, and was taking 3 days of Aleve  before the episode happened.  Will start patient on PPI, check hemoglobin at 14:00 20:00 today. And in AM. -CTA negative for active bleeding -Consult GI - Transfuse for hemodynamic instability and significant drop of H&H - Check iron study  HTN -Hold of ARB -PRN Hydralazine  History of psoriasis arthritis -On outpatient TNF-a antagonist with rheumatology - Avoid NSAIDs  DVT prophylaxis: SCD Code Status: Full code Family Communication wife at bedside Disposition Plan: Patient sick with lower GI bleed requiring close clinical monitoring and inpatient GI consultation, expect more than 2 midnight hospital stay Consults called: GI Admission status: Tele admit   [place] MD [facility] Pager [phone]  07/29/2023, 9:42 AM

## 2023-07-29 NOTE — Anesthesia Preprocedure Evaluation (Signed)
 Anesthesia Evaluation  Patient identified by MRN, date of birth, ID band Patient awake    Reviewed: Allergy & Precautions, NPO status , Patient's Chart, lab work & pertinent test results  History of Anesthesia Complications Negative for: history of anesthetic complications  Airway Mallampati: III  TM Distance: <3 FB Neck ROM: full    Dental  (+) Chipped   Pulmonary neg shortness of breath, sleep apnea and Continuous Positive Airway Pressure Ventilation    Pulmonary exam normal        Cardiovascular Exercise Tolerance: Good hypertension, (-) angina Normal cardiovascular exam     Neuro/Psych negative neurological ROS  negative psych ROS   GI/Hepatic negative GI ROS, Neg liver ROS,neg GERD  ,,  Endo/Other  negative endocrine ROS    Renal/GU negative Renal ROS  negative genitourinary   Musculoskeletal   Abdominal   Peds  Hematology negative hematology ROS (+)   Anesthesia Other Findings Patient is NPO appropriate and reports no nausea or vomiting today.  Past Medical History: No date: Arthritis No date: Hypertension No date: Pulmonary embolus [facility])  Past Surgical History: No date: BICEPS TENDON REPAIR 12/20/2022: COLONOSCOPY WITH PROPOFOL ; N/A     Comment:  Procedure: COLONOSCOPY WITH PROPOFOL ;  Surgeon: Quintin Buckle, DO;  Location: [facility] ENDOSCOPY;  Service:               Gastroenterology;  Laterality: N/A; 12/20/2022: POLYPECTOMY     Comment:  Procedure: POLYPECTOMY;  Surgeon: Quintin Buckle,              DO;  Location: [facility] ENDOSCOPY;  Service:               Gastroenterology;; 04/27/2015: RESECTION DISTAL CLAVICAL; Left     Comment:  Procedure: DISTAL CLAVICLE EXCISION;  Surgeon: Ferd Householder, MD;  Location: [facility];                Service: Orthopedics;  Laterality: Left; 04/27/2015: SHOULDER ARTHROSCOPY WITH ROTATOR CUFF REPAIR AND  SUBACROMIAL  DECOMPRESSION; Left     Comment:  Procedure: LEFT SHOULDER ARTHROSCOPY WITH DEBRIDEMENT,               ACROMIOPLASTY, ROTATOR CUFF REPAIR;  Surgeon: Ferd Householder, MD;  Location: [facility];                Service: Orthopedics;  Laterality: Left; No date: SHOULDER SURGERY  BMI    Body Mass Index: 29.15 kg/m      Reproductive/Obstetrics negative OB ROS                             Anesthesia Physical Anesthesia Plan  ASA: 3  Anesthesia Plan: General   Post-op Pain Management:    Induction: Intravenous  PONV Risk Score and Plan: Propofol  infusion and TIVA  Airway Management Planned: Natural Airway and Nasal Cannula  Additional Equipment:   Intra-op Plan:   Post-operative Plan:   Informed Consent: I have reviewed the patients History and Physical, chart, labs and discussed the procedure including the risks, benefits and alternatives for the proposed anesthesia with the patient or authorized representative who has indicated his/her understanding and acceptance.  Dental Advisory Given  Plan Discussed with: Anesthesiologist, CRNA and Surgeon  Anesthesia Plan Comments: (Patient consented for risks of anesthesia including but not limited to:  - adverse reactions to medications - risk of airway placement if required - damage to eyes, teeth, lips or other oral mucosa - nerve damage due to positioning  - sore throat or hoarseness - Damage to heart, brain, nerves, lungs, other parts of body or loss of life  Patient voiced understanding and assent.)       Anesthesia Quick Evaluation

## 2023-07-29 NOTE — ED Triage Notes (Signed)
 Pt presents to the ED via POV with complaints of rectal bleeding that started yesterday. Pt endorses diarrhea with bright red colored blood mixed in with his stool and covering the toilet. Denies abdominal pain nor hemorrhoids. Hx of a fissure. Of note, pt states he had several episodes of diarrhea and "purple/red" colored stool after consuming beets. A&Ox4 at this time. Denies CP or SOB.

## 2023-07-29 NOTE — ED Provider Notes (Signed)
 [facility] Provider Note    Event Date/Time   First MD Initiated Contact with Patient 07/29/23 0602     (approximate)   History   Chief Complaint Rectal Bleeding   HPI  Tyler Mcclure is a 63 y.o. male with past medical history of hypertension, psoriatic arthritis, and pulmonary embolism who presents to the ED complaining of rectal bleeding.  Patient reports that he initially had some dark red color diarrhea last week after eating a large amount of beets with dinner.  This seemed to stop on its own for a couple of days, but since around lunchtime yesterday he has had the return of maroon-colored diarrhea.  He reports a small amount of stool mixed in but is mostly passing watery red material.  He denies any associated abdominal pain, rectal pain, nausea, or vomiting.  He denies history of similar GI bleeding, but does report history of diverticulosis.  He does not take a blood thinner, previously completed a 47-month course of anticoagulation for provoked pulmonary embolism.  He does report feeling generally weak over the past 12 hours, has not had any chest pain or shortness of breath.     Physical Exam   Triage Vital Signs: ED Triage Vitals  Encounter Vitals Group     BP 07/29/23 0553 136/85     Systolic BP Percentile --      Diastolic BP Percentile --      Pulse Rate 07/29/23 0553 (!) 103     Resp 07/29/23 0553 18     Temp 07/29/23 0553 98.1 F (36.7 C)     Temp Source 07/29/23 0553 Oral     SpO2 07/29/23 0553 100 %     Weight 07/29/23 0549 215 lb (97.5 kg)     Height 07/29/23 0549 6' (1.829 m)     Head Circumference --      Peak Flow --      Pain Score 07/29/23 0549 0     Pain Loc --      Pain Education --      Exclude from Growth Chart --     Most recent vital signs: Vitals:   07/29/23 0553  BP: 136/85  Pulse: (!) 103  Resp: 18  Temp: 98.1 F (36.7 C)  SpO2: 100%    Constitutional: Alert and oriented. Eyes: Conjunctivae are  normal. Head: Atraumatic. Nose: No congestion/rhinnorhea. Mouth/Throat: Mucous membranes are moist.  Cardiovascular: Normal rate, regular rhythm. Grossly normal heart sounds.  2+ radial pulses bilaterally. Respiratory: Normal respiratory effort.  No retractions. Lungs CTAB. Gastrointestinal: Soft and nontender. No distention. Musculoskeletal: No lower extremity tenderness nor edema.  Neurologic:  Normal speech and language. No gross focal neurologic deficits are appreciated.    ED Results / Procedures / Treatments   Labs (all labs ordered are listed, but only abnormal results are displayed) Labs Reviewed  CBC WITH DIFFERENTIAL/PLATELET - Abnormal; Notable for the following components:      Result Value   RBC 2.86 (*)    Hemoglobin 9.1 (*)    HCT 27.4 (*)    nRBC 0.3 (*)    All other components within normal limits  COMPREHENSIVE METABOLIC PANEL WITH GFR - Abnormal; Notable for the following components:   Glucose, Bld 148 (*)    Calcium 8.3 (*)    Total Protein 6.2 (*)    Alkaline Phosphatase 28 (*)    All other components within normal limits  PROTIME-INR  TYPE AND SCREEN  PROCEDURES:  Critical Care performed: No  Procedures   MEDICATIONS ORDERED IN ED: Medications - No data to display   IMPRESSION / MDM / ASSESSMENT AND PLAN / ED COURSE  I reviewed the triage vital signs and the nursing notes.                              63 y.o. male with past medical history of hypertension, psoriatic arthritis, and pulmonary embolism who presents to the ED complaining of red watery stool over the past 24 hours, with similar symptoms last week.  Patient's presentation is most consistent with acute presentation with potential threat to life or bodily function.  Differential diagnosis includes, but is not limited to, upper GI bleed, lower GI bleed, diverticular bleed, diverticulitis, UTI, electrolyte abnormality, AKI.  Patient nontoxic-appearing and in no acute distress,  vital signs are remarkable for tachycardia but otherwise reassuring.  He has a benign abdominal exam, does have a bottle of bloody appearing diarrhea brought with him from home.  He reports frequent episodes over the past 12 hours with increasing weakness, lab results are pending at this time.  We will further assess with CTA of his abdomen/pelvis to assess for acute extravasation from potential diverticular bleed.  Labs with significant drop in hemoglobin from 17 on last check to 9.1 today, no significant leukocytosis, electrolyte abnormality, or AKI noted.  LFTs are unremarkable, INR also reassuring.  CTA of his abdomen/pelvis is pending at this time, patient turned over to oncoming provider pending these results, we will plan for admission for lower GI bleed.      FINAL CLINICAL IMPRESSION(S) / ED DIAGNOSES   Final diagnoses:  Lower GI bleed     Rx / DC Orders   ED Discharge Orders     None        Note:  This document was prepared using Dragon voice recognition software and may include unintentional dictation errors.   Twilla Galea, MD 07/29/23 [phone]

## 2023-07-29 NOTE — ED Provider Notes (Signed)
 CT Angio Abd/Pel W and/or Wo Contrast Result Date: 07/29/2023 CLINICAL DATA:  Lower GI bleeding EXAM: CTA ABDOMEN AND PELVIS WITHOUT AND WITH CONTRAST TECHNIQUE: Multidetector CT imaging of the abdomen and pelvis was performed using the standard protocol during bolus administration of intravenous contrast. Multiplanar reconstructed images and MIPs were obtained and reviewed to evaluate the vascular anatomy. RADIATION DOSE REDUCTION: This exam was performed according to the departmental dose-optimization program which includes automated exposure control, adjustment of the mA and/or kV according to patient size and/or use of iterative reconstruction technique. CONTRAST:  OMNIPAQUE  IOHEXOL  350 MG/ML SOLN COMPARISON:  Abdomen and pelvis CT 06/17/2023 FINDINGS: VASCULAR Aorta: Scattered atheromatous calcification. No dissection or aneurysm Celiac: No stenosis, beading, or aneurysm. No evidence of active bleeding. SMA: Unremarkable.  No source for bleeding Renals: Unremarkable IMA: Patent with no explanation for bleeding. Inflow: Tortuosity.  No stenosis or dissection Proximal Outflow: Unremarkable Veins: Unremarkable Review of the MIP images confirms the above findings. NON-VASCULAR Lower chest:  No contributory findings. Hepatobiliary: Hepatic steatosis. Non worrisome left lobe cyst. No evidence of biliary obstruction or stone. Pancreas: Unremarkable. Spleen: Unremarkable. Adrenals/Urinary Tract: Negative adrenals. No hydronephrosis or stone. Unremarkable bladder. Stomach/Bowel: High-density within the stomach and distal colonic diverticula is present on the pre contrast. No intraluminal hemorrhage detected. No bowel obstruction or visible inflammation. Sigmoid diverticulosis, moderately extensive. Vascular/Lymphatic: No acute vascular abnormality. No mass or adenopathy. Reproductive:No pathologic findings. Other: No ascites or pneumoperitoneum. Musculoskeletal: No acute abnormalities. Lumbar spine degeneration  with L5-S1 ankylosis. IMPRESSION: No localized source or active bleeding by CTA Sigmoid diverticulosis. Hepatic steatosis and mild atherosclerosis. Electronically Signed   By: Ronnette Coke M.D.   On: 07/29/2023 08:10      Patient fully alert and oriented.  Sitting up very pleasant.  He is very much agreeable understanding of plan for admission to the hospital, consulted with and patient accepted to hospitalist by Dr. Jeane Miguel.  Anticipate need for further GI workup.  Currently hemodynamically stable   Iver Marker, MD 07/29/23 [phone]

## 2023-07-29 NOTE — Plan of Care (Signed)
   Problem: Education: Goal: Knowledge of General Education information will improve Description Including pain rating scale, medication(s)/side effects and non-pharmacologic comfort measures Outcome: Progressing   Problem: Health Behavior/Discharge Planning: Goal: Ability to manage health-related needs will improve Outcome: Progressing

## 2023-07-29 NOTE — Progress Notes (Signed)
 Patient off unit at this time.

## 2023-07-29 NOTE — ED Notes (Signed)
 Patient transported to CT

## 2023-07-29 NOTE — ED Notes (Signed)
Pt ambulatory to bathroom with steady gait independently.

## 2023-07-29 NOTE — Care Plan (Signed)
 EGD unremarkable. Will order prep for tonight in case recurrent bleeding. If stools are clear, may cancel colonoscopy. Strongly suspect diverticular bleeding. Will touch base with patient tomorrow.  Olivia Bevel MD, MPH [facility] GI

## 2023-07-29 NOTE — Consult Note (Signed)
 Consultation  Referring Provider:     Hospitalist Admit date: 07/29/2023 Consult date: 07/29/2023         Reason for Consultation: GI Bleeding              HPI:   Tyler Mcclure is a 63 y.o. with history of psoriatic arthritis on humira here with maroon stool. He had maroon stool for a few days and then it stopped. He started taking NSAIDS for shoulder pain and the maroon stool restarted. His hemoglobin dropped from 17 --> 9. He recently was on crestor which caused stomach trouble so it was stopped. Otherwise he has been doing well. Some fatigue currently. No blood thinners. No constipation. No significant abdominal surgeries. No family history of colorectal cancer. He had a good quality colonoscopy in September of 2024. He is somewhat hesistant to repeat a colonoscopy as his mother passed away after a colonoscopy.  Past Medical History:  Diagnosis Date   Arthritis    Hypertension    Pulmonary embolus [facility])     Past Surgical History:  Procedure Laterality Date   BICEPS TENDON REPAIR     COLONOSCOPY WITH PROPOFOL  N/A 12/20/2022   Procedure: COLONOSCOPY WITH PROPOFOL ;  Surgeon: Quintin Buckle, DO;  Location: [facility] ENDOSCOPY;  Service: Gastroenterology;  Laterality: N/A;   POLYPECTOMY  12/20/2022   Procedure: POLYPECTOMY;  Surgeon: Quintin Buckle, DO;  Location: [facility] ENDOSCOPY;  Service: Gastroenterology;;   RESECTION DISTAL CLAVICAL Left 04/27/2015   Procedure: DISTAL CLAVICLE EXCISION;  Surgeon: Ferd Householder, MD;  Location: [facility];  Service: Orthopedics;  Laterality: Left;   SHOULDER ARTHROSCOPY WITH ROTATOR CUFF REPAIR AND SUBACROMIAL DECOMPRESSION Left 04/27/2015   Procedure: LEFT SHOULDER ARTHROSCOPY WITH DEBRIDEMENT, ACROMIOPLASTY, ROTATOR CUFF REPAIR;  Surgeon: Ferd Householder, MD;  Location: [facility];  Service: Orthopedics;  Laterality: Left;   SHOULDER SURGERY      Family History  Problem Relation Age of Onset   Breast cancer Mother     Hyperlipidemia Mother    Pancreatic cancer Father    Healthy Sister    Healthy Daughter    Healthy Son     Social History   Tobacco Use   Smoking status: Never   Smokeless tobacco: Former    Quit date: 04/26/2014  Vaping Use   Vaping status: Never Used  Substance Use Topics   Alcohol use: No    Alcohol/week: 0.0 standard drinks of alcohol    Comment: OCCASIONALLY   Drug use: No    Prior to Admission medications   Medication Sig Start Date End Date Taking? Authorizing Provider  Adalimumab (HUMIRA PEN) 40 MG/0.4ML PNKT Inject into the skin. 05/15/20  Yes [provider]  aspirin EC 81 MG tablet Take 81 mg by mouth every other day.   Yes [provider]  Cholecalciferol (VITAMIN D) 2000 UNITS CAPS Take 1 capsule by mouth daily.   Yes [provider]  ezetimibe (ZETIA) 10 MG tablet Take 1 tablet by mouth daily. 07/17/23 07/16/24 Yes [provider]  MULTIPLE VITAMIN PO Take 1 tablet by mouth daily.   Yes [provider]  Omega-3 Fatty Acids (FISH OIL BURP-LESS) 1200 MG CAPS Take 2 capsules by mouth daily.   Yes [provider]  telmisartan (MICARDIS) 40 MG tablet Take 40 mg by mouth daily.   Yes [provider]  vitamin E 400 UNIT capsule Take 1 capsule by mouth daily.   Yes [provider]  clotrimazole-betamethasone Venita Gilmore)  cream Apply 1 Application topically 2 (two) times daily. Patient not taking: Reported on 07/29/2023    [provider]  influenza vac split quadrivalent PF (FLUARIX QUADRIVALENT ) 0.5 ML injection Inject into the muscle. 01/01/22   Liane Redman, MD  influenza vac split quadrivalent PF (FLUARIX) 0.5 ML injection Inject into the muscle. 01/03/21   Liane Redman, MD  rosuvastatin (CRESTOR) 10 MG tablet Take 1 tablet by mouth daily. Patient not taking: Reported on 07/29/2023 07/02/23 07/01/24  [provider]    Current Facility-Administered Medications  Medication Dose Route  Frequency Provider Last Rate Last Admin   acetaminophen  (TYLENOL ) tablet 650 mg  650 mg Oral Q6H PRN Antoniette Batty T, MD       Or   acetaminophen  (TYLENOL ) suppository 650 mg  650 mg Rectal Q6H PRN [place], MD       [START ON 07/30/2023] aspirin EC tablet 81 mg  81 mg Oral QODAY Zhang, Ping T, MD       hydrALAZINE (APRESOLINE) injection 5 mg  5 mg Intravenous Q6H PRN Antoniette Batty T, MD       ondansetron  (ZOFRAN ) tablet 4 mg  4 mg Oral Q6H PRN Antoniette Batty T, MD       Or   ondansetron  (ZOFRAN ) injection 4 mg  4 mg Intravenous Q6H PRN Antoniette Batty T, MD       pantoprazole (PROTONIX) injection 40 mg  40 mg Intravenous Q12H Antoniette Batty T, MD   40 mg at 07/29/23 1020    Allergies as of 07/29/2023 - Review Complete 07/29/2023  Allergen Reaction Noted   Mercury  11/25/2014   Penicillins  11/25/2014   Phenylmercuric nitrate  11/25/2014     Review of Systems:    All systems reviewed and negative except where noted in HPI.  Review of Systems  Constitutional:  Negative for chills and fever.  Respiratory:  Negative for shortness of breath.   Cardiovascular:  Negative for chest pain.  Gastrointestinal:  Positive for blood in stool. Negative for abdominal pain, constipation, melena, nausea and vomiting.  Musculoskeletal:  Positive for joint pain.  Skin:  Negative for rash.  Neurological:  Negative for focal weakness.  Psychiatric/Behavioral:  Negative for substance abuse.   All other systems reviewed and are negative.      Physical Exam:  Vital signs in last 24 hours: Temp:  [98.1 F (36.7 C)-98.3 F (36.8 C)] 98.3 F (36.8 C) (05/06 1137) Pulse Rate:  [95-108] 105 (05/06 1137) Resp:  [16-18] 16 (05/06 1137) BP: (104-138)/(67-89) 121/85 (05/06 1137) SpO2:  [96 %-100 %] 99 % (05/06 1137) Weight:  [97.5 kg] 97.5 kg (05/06 0549)   General:   Pleasant in NAD Head:  Normocephalic and atraumatic. Eyes:   No icterus.   Conjunctiva pink. Ears:  Normal auditory acuity. Mouth: Mucosa pink  moist, no lesions. Neck:  Supple; no masses felt Lungs:  No respiratory distress Abdomen:   Flat, soft, nondistended, nontender Rectal:  Not performed.  Msk:  MAEW x4, No clubbing or cyanosis. Strength 5/5. Symmetrical without gross deformities. Neurologic:  Alert and  oriented x4;  No focal deficits Skin:  Warm, dry, pink without significant lesions or rashes. Psych:  Alert and cooperative. Normal affect.  LAB RESULTS: Recent Labs    07/29/23 0624  WBC 6.2  HGB 9.1*  HCT 27.4*  PLT 202   BMET Recent Labs    07/29/23 0624  NA 138  K 3.8  CL 107  CO2 22  GLUCOSE 148*  BUN 15  CREATININE 0.78  CALCIUM 8.3*   LFT Recent Labs    07/29/23 0624  PROT 6.2*  ALBUMIN 3.7  AST 28  ALT 37  ALKPHOS 28*  BILITOT 0.9   PT/INR Recent Labs    07/29/23 0624  LABPROT 13.7  INR 1.0    STUDIES: CT Angio Abd/Pel W and/or Wo Contrast Result Date: 07/29/2023 CLINICAL DATA:  Lower GI bleeding EXAM: CTA ABDOMEN AND PELVIS WITHOUT AND WITH CONTRAST TECHNIQUE: Multidetector CT imaging of the abdomen and pelvis was performed using the standard protocol during bolus administration of intravenous contrast. Multiplanar reconstructed images and MIPs were obtained and reviewed to evaluate the vascular anatomy. RADIATION DOSE REDUCTION: This exam was performed according to the departmental dose-optimization program which includes automated exposure control, adjustment of the mA and/or kV according to patient size and/or use of iterative reconstruction technique. CONTRAST:  OMNIPAQUE  IOHEXOL  350 MG/ML SOLN COMPARISON:  Abdomen and pelvis CT 06/17/2023 FINDINGS: VASCULAR Aorta: Scattered atheromatous calcification. No dissection or aneurysm Celiac: No stenosis, beading, or aneurysm. No evidence of active bleeding. SMA: Unremarkable.  No source for bleeding Renals: Unremarkable IMA: Patent with no explanation for bleeding. Inflow: Tortuosity.  No stenosis or dissection Proximal Outflow:  Unremarkable Veins: Unremarkable Review of the MIP images confirms the above findings. NON-VASCULAR Lower chest:  No contributory findings. Hepatobiliary: Hepatic steatosis. Non worrisome left lobe cyst. No evidence of biliary obstruction or stone. Pancreas: Unremarkable. Spleen: Unremarkable. Adrenals/Urinary Tract: Negative adrenals. No hydronephrosis or stone. Unremarkable bladder. Stomach/Bowel: High-density within the stomach and distal colonic diverticula is present on the pre contrast. No intraluminal hemorrhage detected. No bowel obstruction or visible inflammation. Sigmoid diverticulosis, moderately extensive. Vascular/Lymphatic: No acute vascular abnormality. No mass or adenopathy. Reproductive:No pathologic findings. Other: No ascites or pneumoperitoneum. Musculoskeletal: No acute abnormalities. Lumbar spine degeneration with L5-S1 ankylosis. IMPRESSION: No localized source or active bleeding by CTA Sigmoid diverticulosis. Hepatic steatosis and mild atherosclerosis. Electronically Signed   By: Ronnette Coke M.D.   On: 07/29/2023 08:10       Impression / Plan:   63 y/o gentleman with history of psoriatic arthritis here with maroon stool. I suspect this is diverticular bleeding but given drop of hemoglobin and NSAID use, will rule out upper GI source  - NPO - will plan on EGD today - further recs after procedure - trend CBC's - transfuse for hemoglobin < 7 - if any hemodynamically significant bleeding, repeat CTA  Will continue to follow.  Olivia Bevel MD, MPH [facility] GI

## 2023-07-30 DIAGNOSIS — K922 Gastrointestinal hemorrhage, unspecified: Secondary | ICD-10-CM

## 2023-07-30 LAB — CBC
HCT: 26 % — ABNORMAL LOW (ref 39.0–52.0)
Hemoglobin: 8.7 g/dL — ABNORMAL LOW (ref 13.0–17.0)
MCH: 31.2 pg (ref 26.0–34.0)
MCHC: 33.5 g/dL (ref 30.0–36.0)
MCV: 93.2 fL (ref 80.0–100.0)
Platelets: 190 10*3/uL (ref 150–400)
RBC: 2.79 MIL/uL — ABNORMAL LOW (ref 4.22–5.81)
RDW: 14 % (ref 11.5–15.5)
WBC: 5.7 10*3/uL (ref 4.0–10.5)
nRBC: 0 % (ref 0.0–0.2)

## 2023-07-30 MED ORDER — IRON SUCROSE 200 MG IVPB - SIMPLE MED
200.0000 mg | Freq: Once | Status: AC
  Administered 2023-07-30: 200 mg via INTRAVENOUS
  Filled 2023-07-30: qty 200

## 2023-07-30 NOTE — Anesthesia Postprocedure Evaluation (Signed)
 Anesthesia Post Note  Patient: Tyler Mcclure  Procedure(s) Performed: EGD (ESOPHAGOGASTRODUODENOSCOPY)  Patient location during evaluation: Endoscopy Anesthesia Type: General Level of consciousness: awake and alert Pain management: pain level controlled Vital Signs Assessment: post-procedure vital signs reviewed and stable Respiratory status: spontaneous breathing, nonlabored ventilation, respiratory function stable and patient connected to nasal cannula oxygen Cardiovascular status: blood pressure returned to baseline and stable Postop Assessment: no apparent nausea or vomiting Anesthetic complications: no   No notable events documented.   Last Vitals:  Vitals:   07/30/23 0415 07/30/23 0821  BP: 115/77 122/81  Pulse: 80 74  Resp: 18 16  Temp: 36.7 C (!) 36.4 C  SpO2: 98% 100%    Last Pain:  Vitals:   07/30/23 1100  TempSrc:   PainSc: 0-No pain                 Portia Brittle Yossef Gilkison

## 2023-07-30 NOTE — Progress Notes (Signed)
 GI Inpatient Follow-up Note  Subjective:  Patient seen and doing well. Clear stools. Hemoglobin drifted down a bit.  Scheduled Inpatient Medications:   aspirin EC  81 mg Oral QODAY   pantoprazole (PROTONIX) IV  40 mg Intravenous Q12H    Continuous Inpatient Infusions:    PRN Inpatient Medications:  acetaminophen  **OR** acetaminophen , hydrALAZINE, ondansetron  **OR** ondansetron  (ZOFRAN ) IV  Review of Systems:  Review of Systems  Constitutional:  Negative for chills and fever.  Respiratory:  Negative for shortness of breath.   Cardiovascular:  Negative for chest pain.  Gastrointestinal:  Negative for abdominal pain, blood in stool and constipation.  Musculoskeletal:  Positive for joint pain.  Skin:  Negative for rash.  Neurological:  Negative for focal weakness.  Psychiatric/Behavioral:  Negative for substance abuse.   All other systems reviewed and are negative.     Physical Examination: BP 122/81 (BP Location: Left Arm)   Pulse 74   Temp (!) 97.5 F (36.4 C)   Resp 16   Ht 6' (1.829 m)   Wt 97.5 kg   SpO2 100%   BMI 29.15 kg/m  Gen: NAD, alert and oriented x 4 HEENT: PEERLA, EOMI, Neck: supple, no JVD or thyromegaly Chest: No respiratory distress Abd: soft, NT, ND Ext: no edema, well perfused with 2+ pulses, Skin: no rash or lesions noted Lymph: no LAD  Data: Lab Results  Component Value Date   WBC 5.7 07/30/2023   HGB 8.7 (L) 07/30/2023   HCT 26.0 (L) 07/30/2023   MCV 93.2 07/30/2023   PLT 190 07/30/2023   Recent Labs  Lab 07/29/23 1413 07/29/23 2152 07/30/23 0555  HGB 9.0* 9.4* 8.7*   Lab Results  Component Value Date   NA 138 07/29/2023   K 3.8 07/29/2023   CL 107 07/29/2023   CO2 22 07/29/2023   BUN 15 07/29/2023   CREATININE 0.78 07/29/2023   Lab Results  Component Value Date   ALT 37 07/29/2023   AST 28 07/29/2023   ALKPHOS 28 (L) 07/29/2023   BILITOT 0.9 07/29/2023   Recent Labs  Lab 07/29/23 0624  INR 1.0    Assessment/Plan: Mr. Schlimgen is a 63 y.o. gentleman with history of psoriatic arthritis here with maroon stool. Likely diverticular bleeding that has resolved. Discussed risks/benefits of colonoscopy and have jointly decided to avoid colonoscopy at this time  Recommendations:  - will advance diet to soft - dc IV protonix - daily cbc's - transfuse to keep hemoglobin > 7 - monitor for recurrent bleeding - recommend IV iron while here - if no issues today, then likely D/C tomorrow  Olivia Bevel MD, MPH [facility] GI

## 2023-07-30 NOTE — Progress Notes (Signed)
 Progress Note    Tyler Mcclure  UYQ:[id number] DOB: 06-Mar-1961  DOA: 07/29/2023 PCP: Nikki Barters, MD      Brief Narrative:    Medical records reviewed and are as summarized below:  Tyler Mcclure is a 63 y.o. male  with medical history significant of psoriasis arthritis on DMARDs, HTN, remote history of provoked PE status post anticoagulation treatment x 3 months, diverticulosis/diverticulitis but no previous history of GI bleed, presented with recurrent rectal bleeding.   Patient reported that he had a history of colitis September last year with sudden onset abdominal pain but no lower GI bleed and he was treated with antibiotics and colonoscopy confirmed diverticulosis and 1 polyp.  Since then the patient has had intermittent left lower quadrant abdominal pain.  2 weeks ago patient was started on Crestor for HLD, after taking rosuvastatin for few days patient started develop severe constant epigastric pain and he stopped taking Crestor and PCP switched him to Zetia instead, and no more abdominal pain since.  Last week, patient started to have a flareup of joint pain and he took 3 days of Aleve , last Tuesday he started to have lower GI bleed with maroon-colored bloody diarrhea for 2 days, then stopped spontaneously. Sunday (on day 07/13/2023), he took another Aleve  x 3 pills and then started to pass maroon-colored diarrhea overnight, 5-6 times.  He drinks a beer or 2 on weekends.       Assessment/Plan:   Principal Problem:   Lower GI bleed Active Problems:   GI bleed    Body mass index is 29.15 kg/m.   Acute GI bleeding: Suspected diverticular bleeding.  No further rectal bleeding reported.  Colonoscopy has been deferred.  Monitor overnight for recurrent bleeding.  Follow-up with gastroenterologist.   Acute blood loss anemia: Hemoglobin dropped from 9.1-8.7. Patient will be given 1 dose of IV iron. Hemoglobin was 17.1 in October 2024.   Hypertension:  Telmisartan on hold   Comorbidities include hyperlipidemia, psoriatic arthritis (on adalimumab), history of PE   Diet Order             DIET SOFT Room service appropriate? Yes; Fluid consistency: Thin  Diet effective now                            Consultants: Gastroenterologist  Procedures: None    Medications:    aspirin EC  81 mg Oral QODAY   Continuous Infusions:   Anti-infectives (From admission, onward)    None              Family Communication/Anticipated D/C date and plan/Code Status   DVT prophylaxis: SCDs Start: 07/29/23 0941     Code Status: Full Code  Family Communication: Plan discussed with his wife at the bedside Disposition Plan: Plan to discharge home   Status is: Inpatient Remains inpatient appropriate because: Acute GI bleeding       Subjective:   Interval events noted.  He had diarrhea overnight because of colon prep.  No bloody stools.  Objective:    Vitals:   07/29/23 2030 07/29/23 2344 07/30/23 0415 07/30/23 0821  BP: (!) 140/81 116/84 115/77 122/81  Pulse: 85 81 80 74  Resp: 16 16 18 16   Temp: 97.6 F (36.4 C) 97.6 F (36.4 C) 98.1 F (36.7 C) (!) 97.5 F (36.4 C)  TempSrc: Oral     SpO2: 100% 99% 98% 100%  Weight:  Height:       No data found.   Intake/Output Summary (Last 24 hours) at 07/30/2023 1543 Last data filed at 07/30/2023 1524 Gross per 24 hour  Intake 240 ml  Output --  Net 240 ml   Filed Weights   07/29/23 0549 07/29/23 1300  Weight: 97.5 kg 97.5 kg    Exam:  GEN: NAD SKIN: Warm and dry EYES: No pallor or icterus ENT: MMM CV: RRR PULM: CTA B ABD: soft, ND, NT, +BS CNS: AAO x 3, non focal EXT: No edema or tenderness        Data Reviewed:   I have personally reviewed following labs and imaging studies:  Labs: Labs show the following:   Basic Metabolic Panel: Recent Labs  Lab 07/29/23 0624  NA 138  K 3.8  CL 107  CO2 22  GLUCOSE 148*  BUN 15   CREATININE 0.78  CALCIUM 8.3*   GFR Estimated Creatinine Clearance: 115.9 mL/min (by C-G formula based on SCr of 0.78 mg/dL). Liver Function Tests: Recent Labs  Lab 07/29/23 0624  AST 28  ALT 37  ALKPHOS 28*  BILITOT 0.9  PROT 6.2*  ALBUMIN 3.7   No results for input(s): "LIPASE", "AMYLASE" in the last 168 hours. No results for input(s): "AMMONIA" in the last 168 hours. Coagulation profile Recent Labs  Lab 07/29/23 0624  INR 1.0    CBC: Recent Labs  Lab 07/29/23 0624 07/29/23 1413 07/29/23 2152 07/30/23 0555  WBC 6.2  --   --  5.7  NEUTROABS 2.6  --   --   --   HGB 9.1* 9.0* 9.4* 8.7*  HCT 27.4* 26.8* 28.1* 26.0*  MCV 95.8  --   --  93.2  PLT 202  --   --  190   Cardiac Enzymes: No results for input(s): "CKTOTAL", "CKMB", "CKMBINDEX", "TROPONINI" in the last 168 hours. BNP (last 3 results) No results for input(s): "PROBNP" in the last 8760 hours. CBG: No results for input(s): "GLUCAP" in the last 168 hours. D-Dimer: No results for input(s): "DDIMER" in the last 72 hours. Hgb A1c: No results for input(s): "HGBA1C" in the last 72 hours. Lipid Profile: No results for input(s): "CHOL", "HDL", "LDLCALC", "TRIG", "CHOLHDL", "LDLDIRECT" in the last 72 hours. Thyroid  function studies: No results for input(s): "TSH", "T4TOTAL", "T3FREE", "THYROIDAB" in the last 72 hours.  Invalid input(s): "FREET3" Anemia work up: Recent Labs    07/29/23 0624  FERRITIN 212  TIBC 419  IRON 82  RETICCTPCT 6.9*   Sepsis Labs: Recent Labs  Lab 07/29/23 0624 07/30/23 0555  WBC 6.2 5.7    Microbiology No results found for this or any previous visit (from the past 240 hours).  Procedures and diagnostic studies:  CT Angio Abd/Pel W and/or Wo Contrast Result Date: 07/29/2023 CLINICAL DATA:  Lower GI bleeding EXAM: CTA ABDOMEN AND PELVIS WITHOUT AND WITH CONTRAST TECHNIQUE: Multidetector CT imaging of the abdomen and pelvis was performed using the standard protocol during  bolus administration of intravenous contrast. Multiplanar reconstructed images and MIPs were obtained and reviewed to evaluate the vascular anatomy. RADIATION DOSE REDUCTION: This exam was performed according to the departmental dose-optimization program which includes automated exposure control, adjustment of the mA and/or kV according to patient size and/or use of iterative reconstruction technique. CONTRAST:  OMNIPAQUE  IOHEXOL  350 MG/ML SOLN COMPARISON:  Abdomen and pelvis CT 06/17/2023 FINDINGS: VASCULAR Aorta: Scattered atheromatous calcification. No dissection or aneurysm Celiac: No stenosis, beading, or aneurysm. No evidence of active  bleeding. SMA: Unremarkable.  No source for bleeding Renals: Unremarkable IMA: Patent with no explanation for bleeding. Inflow: Tortuosity.  No stenosis or dissection Proximal Outflow: Unremarkable Veins: Unremarkable Review of the MIP images confirms the above findings. NON-VASCULAR Lower chest:  No contributory findings. Hepatobiliary: Hepatic steatosis. Non worrisome left lobe cyst. No evidence of biliary obstruction or stone. Pancreas: Unremarkable. Spleen: Unremarkable. Adrenals/Urinary Tract: Negative adrenals. No hydronephrosis or stone. Unremarkable bladder. Stomach/Bowel: High-density within the stomach and distal colonic diverticula is present on the pre contrast. No intraluminal hemorrhage detected. No bowel obstruction or visible inflammation. Sigmoid diverticulosis, moderately extensive. Vascular/Lymphatic: No acute vascular abnormality. No mass or adenopathy. Reproductive:No pathologic findings. Other: No ascites or pneumoperitoneum. Musculoskeletal: No acute abnormalities. Lumbar spine degeneration with L5-S1 ankylosis. IMPRESSION: No localized source or active bleeding by CTA Sigmoid diverticulosis. Hepatic steatosis and mild atherosclerosis. Electronically Signed   By: Ronnette Coke M.D.   On: 07/29/2023 08:10               LOS: 1 day    Basil Buffin  [facility]   Pager on [email or web address]. If 7PM-7AM, please contact night-coverage at [email or web address]     07/30/2023, 3:43 PM

## 2023-07-31 DIAGNOSIS — K922 Gastrointestinal hemorrhage, unspecified: Secondary | ICD-10-CM | POA: Diagnosis not present

## 2023-07-31 LAB — CBC
HCT: 27 % — ABNORMAL LOW (ref 39.0–52.0)
Hemoglobin: 8.9 g/dL — ABNORMAL LOW (ref 13.0–17.0)
MCH: 31.4 pg (ref 26.0–34.0)
MCHC: 33 g/dL (ref 30.0–36.0)
MCV: 95.4 fL (ref 80.0–100.0)
Platelets: 200 10*3/uL (ref 150–400)
RBC: 2.83 MIL/uL — ABNORMAL LOW (ref 4.22–5.81)
RDW: 14.3 % (ref 11.5–15.5)
WBC: 6.1 10*3/uL (ref 4.0–10.5)
nRBC: 0 % (ref 0.0–0.2)

## 2023-07-31 NOTE — Plan of Care (Signed)

## 2023-07-31 NOTE — Discharge Summary (Signed)
 Physician Discharge Summary   Patient: Tyler Mcclure MRN: [id number] DOB: [birth year]  Admit date:     07/29/2023  Discharge date: 07/31/23  Discharge Physician: Sheril Dines   PCP: Nikki Barters, MD   Recommendations at discharge:   Follow-up with PCP in 1 to 2 weeks  Discharge Diagnoses: Principal Problem:   Lower GI bleed Active Problems:   GI bleed  Resolved Problems:   * No resolved hospital problems. *  Hospital Course:  WILLIAMSON Mcclure is a 63 y.o. male  with medical history significant of psoriasis arthritis on DMARDs, HTN, remote history of provoked PE status post anticoagulation treatment x 3 months, diverticulosis/diverticulitis but no previous history of GI bleed, presented with recurrent rectal bleeding.   Patient reported that he had a history of colitis September last year with sudden onset abdominal pain but no lower GI bleed and he was treated with antibiotics, and colonoscopy confirmed diverticulosis and 1 polyp.  Since then the patient has had intermittent left lower quadrant abdominal pain.  2 weeks ago patient was started on Crestor for HLD, after taking rosuvastatin for few days patient started develop severe constant epigastric pain and he stopped taking Crestor, and PCP switched him to Zetia instead.No more abdominal pain since.  About 10 days prior to admission, patient started to have a flareup of joint pain so he took Aleve  for 3 days.  About a week before admission, he started to have lower GI bleed with maroon-colored bloody diarrhea for 2 days, then stopped spontaneously. Sunday (07/27/2023), he took another Aleve  x 3 pills and then started to pass maroon-colored diarrhea overnight, 5-6 times.  He drinks a beer or 2 on weekends.    Assessment and Plan:  Acute GI bleeding: Resolved.  S/p EGD 07/29/2023 which showed normal esophagus, normal stomach and erythematous duodenopathy.  Suspected diverticular bleeding.  No further rectal bleeding reported.   Colonoscopy has been deferred.   GI has signed off.     Acute blood loss anemia: H&H stable.  Hemoglobin 9.1-8.7-8.9 S/p IV iron infusion on 07/30/2023.  He said he feels much better after IV iron infusion. Hemoglobin was 17.1 in October 2024. Outpatient follow-up with PCP to monitor CBC    Hypertension: Resume telmisartan at discharge     Comorbidities include hyperlipidemia, psoriatic arthritis (on adalimumab), history of PE    His condition has improved.  He has been ambulated in the hallways without any symptoms.  He is deemed stable for discharge to home today.  Discharge plan was discussed with patient and his wife at the bedside.      Consultants: Gastroenterologist Procedures performed: EGD Disposition: Home Diet recommendation:  Cardiac diet DISCHARGE MEDICATION: Allergies as of 07/31/2023       Reactions   Mercury    Penicillins    Other Reaction(s): Unknown   Phenylmercuric Nitrate    Other Reaction(s): Unknown        Medication List     STOP taking these medications    aspirin EC 81 MG tablet   clotrimazole-betamethasone cream Commonly known as: LOTRISONE   Fluarix Quadrivalent  0.5 ML injection Generic drug: influenza vac split quadrivalent PF   rosuvastatin 10 MG tablet Commonly known as: CRESTOR       TAKE these medications    ezetimibe 10 MG tablet Commonly known as: ZETIA Take 1 tablet by mouth daily.   Fish Oil Burp-Less 1200 MG Caps Take 2 capsules by mouth daily.   Humira (2 Pen) 40 MG/0.4ML  pen Generic drug: adalimumab Inject into the skin.   MULTIPLE VITAMIN PO Take 1 tablet by mouth daily.   telmisartan 40 MG tablet Commonly known as: MICARDIS Take 40 mg by mouth daily.   Vitamin D 50 MCG (2000 UT) Caps Take 1 capsule by mouth daily.   vitamin E 180 MG (400 UNITS) capsule Take 1 capsule by mouth daily.        Discharge Exam: Filed Weights   07/29/23 0549 07/29/23 1300  Weight: 97.5 kg 97.5 kg   GEN:  NAD SKIN: Warm and dry EYES: No pallor or icterus ENT: MMM CV: RRR PULM: CTA B ABD: soft, ND, NT, +BS CNS: AAO x 3, non focal EXT: No edema or tenderness   Condition at discharge: good  The results of significant diagnostics from this hospitalization (including imaging, microbiology, ancillary and laboratory) are listed below for reference.   Imaging Studies: CT Angio Abd/Pel W and/or Wo Contrast Result Date: 07/29/2023 CLINICAL DATA:  Lower GI bleeding EXAM: CTA ABDOMEN AND PELVIS WITHOUT AND WITH CONTRAST TECHNIQUE: Multidetector CT imaging of the abdomen and pelvis was performed using the standard protocol during bolus administration of intravenous contrast. Multiplanar reconstructed images and MIPs were obtained and reviewed to evaluate the vascular anatomy. RADIATION DOSE REDUCTION: This exam was performed according to the departmental dose-optimization program which includes automated exposure control, adjustment of the mA and/or kV according to patient size and/or use of iterative reconstruction technique. CONTRAST:  OMNIPAQUE  IOHEXOL  350 MG/ML SOLN COMPARISON:  Abdomen and pelvis CT 06/17/2023 FINDINGS: VASCULAR Aorta: Scattered atheromatous calcification. No dissection or aneurysm Celiac: No stenosis, beading, or aneurysm. No evidence of active bleeding. SMA: Unremarkable.  No source for bleeding Renals: Unremarkable IMA: Patent with no explanation for bleeding. Inflow: Tortuosity.  No stenosis or dissection Proximal Outflow: Unremarkable Veins: Unremarkable Review of the MIP images confirms the above findings. NON-VASCULAR Lower chest:  No contributory findings. Hepatobiliary: Hepatic steatosis. Non worrisome left lobe cyst. No evidence of biliary obstruction or stone. Pancreas: Unremarkable. Spleen: Unremarkable. Adrenals/Urinary Tract: Negative adrenals. No hydronephrosis or stone. Unremarkable bladder. Stomach/Bowel: High-density within the stomach and distal colonic diverticula is  present on the pre contrast. No intraluminal hemorrhage detected. No bowel obstruction or visible inflammation. Sigmoid diverticulosis, moderately extensive. Vascular/Lymphatic: No acute vascular abnormality. No mass or adenopathy. Reproductive:No pathologic findings. Other: No ascites or pneumoperitoneum. Musculoskeletal: No acute abnormalities. Lumbar spine degeneration with L5-S1 ankylosis. IMPRESSION: No localized source or active bleeding by CTA Sigmoid diverticulosis. Hepatic steatosis and mild atherosclerosis. Electronically Signed   By: Ronnette Coke M.D.   On: 07/29/2023 08:10    Microbiology: Results for orders placed or performed during the hospital encounter of 01/03/23  SARS Coronavirus 2 by RT PCR (hospital order, performed in [facility] hospital lab) *cepheid single result test* Anterior Nasal Swab     Status: None   Collection Time: 01/03/23  1:04 AM   Specimen: Anterior Nasal Swab  Result Value Ref Range Status   SARS Coronavirus 2 by RT PCR NEGATIVE NEGATIVE Final    Comment: (NOTE) SARS-CoV-2 target nucleic acids are NOT DETECTED.  The SARS-CoV-2 RNA is generally detectable in upper and lower respiratory specimens during the acute phase of infection. The lowest concentration of SARS-CoV-2 viral copies this assay can detect is 250 copies / mL. A negative result does not preclude SARS-CoV-2 infection and should not be used as the sole basis for treatment or other patient management decisions.  A negative result may occur with improper specimen  collection / handling, submission of specimen other than nasopharyngeal swab, presence of viral mutation(s) within the areas targeted by this assay, and inadequate number of viral copies (<250 copies / mL). A negative result must be combined with clinical observations, patient history, and epidemiological information.  Fact Sheet for Patients:   RoadLapTop.co.za  Fact Sheet for Healthcare  Providers: http://kim-miller.com/  This test is not yet approved or  cleared by the [place]  FDA and has been authorized for detection and/or diagnosis of SARS-CoV-2 by FDA under an Emergency Use Authorization (EUA).  This EUA will remain in effect (meaning this test can be used) for the duration of the COVID-19 declaration under Section 564(b)(1) of the Act, 21 U.S.C. section 360bbb-3(b)(1), unless the authorization is terminated or revoked sooner.  Performed at [facility], [address] [place], [place] [address]   Blood culture (single)     Status: None   Collection Time: 01/03/23  1:04 AM   Specimen: BLOOD  Result Value Ref Range Status   Specimen Description BLOOD LEFT FOREARM  Final   Special Requests   Final    BOTTLES DRAWN AEROBIC AND ANAEROBIC Blood Culture adequate volume   Culture   Final    NO GROWTH 5 DAYS Performed at [facility], [address] [place], [place] [address]    Report Status 01/08/2023 FINAL  Final    Labs: CBC: Recent Labs  Lab 07/29/23 0624 07/29/23 1413 07/29/23 2152 07/30/23 0555 07/31/23 0518  WBC 6.2  --   --  5.7 6.1  NEUTROABS 2.6  --   --   --   --   HGB 9.1* 9.0* 9.4* 8.7* 8.9*  HCT 27.4* 26.8* 28.1* 26.0* 27.0*  MCV 95.8  --   --  93.2 95.4  PLT 202  --   --  190 200   Basic Metabolic Panel: Recent Labs  Lab 07/29/23 0624  NA 138  K 3.8  CL 107  CO2 22  GLUCOSE 148*  BUN 15  CREATININE 0.78  CALCIUM 8.3*   Liver Function Tests: Recent Labs  Lab 07/29/23 0624  AST 28  ALT 37  ALKPHOS 28*  BILITOT 0.9  PROT 6.2*  ALBUMIN 3.7   CBG: No results for input(s): "GLUCAP" in the last 168 hours.  Discharge time spent: greater than 30 minutes.  Signed: Sheril Dines, MD [facility] 07/31/2023

## 2023-08-08 NOTE — Unmapped (Signed)
 This pharmacist was notified by a technician that this patient has reported that they've missed /delayed 1 doses of their adalimumab.. I have reviewed the patient's medical record and have determined that no further pharmacist action is needed.      Approximate time spent: 0-5 minutes    Sherle Dire, PharmD, Clinical Specialty Pharmacist  [facility]

## 2023-08-08 NOTE — Unmapped (Signed)
 [facility] Delivery Pharm[facility] Refill Coordination Note    Specialty Medication(s) to be Shipped:   Inflammatory Disorders: Adalimumab-adaz    Other medication(s) to be shipped: No additional medications requested for fill at this time     Joshua Mccall, DOB: Apr 10, 1960  Phone: [phone] (home)       All above HIPAA information was verified with patient.     Was a Nurse, learning disability used for this call? No    Completed refill call assessment today to schedule patient's medication shipment from the [facility]  [phone]).  All relevant notes have been reviewed.     Specialty medication(s) and dose(s) confirmed: Regimen is correct and unchanged.   Changes to medications: Joshua Mccall reports no changes at this time.  Changes to insurance: No  New side effects reported not previously addressed with a pharmacist or physician: None reported  Questions for the pharmacist: No    Confirmed patient received a Conservation officer, historic buildings and a Surveyor, mining with first shipment. The patient will receive a drug information handout for each medication shipped and additional FDA Medication Guides as required.       DISEASE/MEDICATION-SPECIFIC INFORMATION        For patients on injectable medications: Patient currently has 1 doses left.  Next injection is scheduled for 5.17.2025.    SPECIALTY MEDICATION ADHERENCE     Medication Adherence    Patient reported X missed doses in the last month: 0  Specialty Medication: adalimumab-adaz 40 mg/0.4 mL Syrg  Patient is on additional specialty medications: No              Were doses missed due to medication being on hold? Yes - delayed per provider's advise      adalimumab-adaz 40 mg/0.4 mL Syrg: 1 doses of medicine on hand       REFERRAL TO PHARMACIST     Referral to the pharmacist: Yes - routine compliance concerns. Patient has missed 1-3 doses of medication. Refills were scheduled and concern routed to pharmacist for evaluation.      SHIPPING Shipping address confirmed in Epic.     Cost and Payment: Patient has a $0 copay, payment information is not required.    Delivery Scheduled: Yes, Expected medication delivery date: 5.22.2025.     Medication will be delivered via Same Day Courier to the prescription address in Epic WAM.    Joshua Mccall   [facility]  Specialty Technician

## 2023-08-14 MED FILL — ADALIMUMAB-ADAZ 40 MG/0.4 ML SUBCUTANEOUS SYRINGE: SUBCUTANEOUS | 28 days supply | Qty: 0.8 | Fill #10

## 2023-09-04 NOTE — Unmapped (Signed)
 [facility] Specialty and Home Delivery Pharmacy Refill Coordination Note    Specialty Medication(s) to be Shipped:   Inflammatory Disorders: Adalimumab-adaz    Other medication(s) to be shipped: No additional medications requested for fill at this time     Joshua Mccall, DOB: December 16, 1960  Phone: [phone] (home)       All above HIPAA information was verified with patient.     Was a Nurse, learning disability used for this call? No    Completed refill call assessment today to schedule patient's medication shipment from the [facility]  [phone]).  All relevant notes have been reviewed.     Specialty medication(s) and dose(s) confirmed: Regimen is correct and unchanged.   Changes to medications: [name] reports no changes at this time.  Changes to insurance: No  New side effects reported not previously addressed with a pharmacist or physician: None reported  Questions for the pharmacist: No    Confirmed patient received a Conservation officer, historic buildings and a Surveyor, mining with first shipment. The patient will receive a drug information handout for each medication shipped and additional FDA Medication Guides as required.       DISEASE/MEDICATION-SPECIFIC INFORMATION        For patients on injectable medications: Patient currently has 1 doses left.  Next injection is scheduled for 6.14.2024.    SPECIALTY MEDICATION ADHERENCE     Medication Adherence    Specialty Medication: adalimumab-adaz 40 mg/0.4 mL Syrg  Patient is on additional specialty medications: No              Were doses missed due to medication being on hold? No       adalimumab-adaz 40 mg/0.4 mL Syrg  : 1 doses of medicine on hand       REFERRAL TO PHARMACIST     Referral to the pharmacist: Not needed      [facility]     Shipping address confirmed in Epic.     Cost and Payment: Patient has a $0 copay, payment information is not required.    Delivery Scheduled: Yes, Expected medication delivery date: 6.19.25.     Medication will be delivered via Same Day Courier to the prescription address in Epic WAM.    Marcella Serge   [facility]  Specialty Technician

## 2023-09-11 MED FILL — ADALIMUMAB-ADAZ 40 MG/0.4 ML SUBCUTANEOUS SYRINGE: SUBCUTANEOUS | 28 days supply | Qty: 0.8 | Fill #11

## 2023-10-02 DIAGNOSIS — L409 Psoriasis, unspecified: Principal | ICD-10-CM

## 2023-10-02 DIAGNOSIS — L4050 Arthropathic psoriasis, unspecified: Principal | ICD-10-CM

## 2023-10-02 MED ORDER — ADALIMUMAB-ADAZ 40 MG/0.4 ML SUBCUTANEOUS SYRINGE
SUBCUTANEOUS | 3 refills | 168.00000 days | Status: CP
Start: 2023-10-02 — End: ?

## 2023-10-02 NOTE — Unmapped (Addendum)
 adalimumab -adaz updated labs are in from outside facility  Last Visit Date: 07/16/2023  Next Visit Date: 01/28/2024    Lab Results   Component Value Date    ALT 42 09/22/2020    AST 26 09/22/2020    ALBUMIN 4.5 05/15/2020    CREATININE 0.78 09/22/2020     Lab Results   Component Value Date    WBC 5.7 09/22/2020    HGB 16.3 09/22/2020    HCT 45.7 09/22/2020    PLT 188 09/22/2020     Lab Results   Component Value Date    NEUTROPCT 40.1 09/22/2020    LYMPHOPCT 38.0 09/22/2020    MONOPCT 11.3 09/22/2020    EOSPCT 9.9 09/22/2020    BASOPCT 0.7 09/22/2020

## 2023-10-02 NOTE — Unmapped (Signed)
 Adalimumab -adaz - last filled 09/11/23 as a 28-day supply. Previous encounter documentation reflects the patient will use their next injection on 09/06/23 and patient reported having 1 injections on hand. Rescheduling RC date because the patient should not need their next dose until 10/18/23.

## 2023-10-14 DIAGNOSIS — L409 Psoriasis, unspecified: Principal | ICD-10-CM

## 2023-10-14 DIAGNOSIS — L4050 Arthropathic psoriasis, unspecified: Principal | ICD-10-CM

## 2023-10-14 MED ORDER — ADALIMUMAB-ADAZ 40 MG/0.4 ML SUBCUTANEOUS SYRINGE
SUBCUTANEOUS | 3 refills | 168.00000 days
Start: 2023-10-14 — End: ?

## 2023-10-15 DIAGNOSIS — L4050 Arthropathic psoriasis, unspecified: Principal | ICD-10-CM

## 2023-10-15 DIAGNOSIS — L409 Psoriasis, unspecified: Principal | ICD-10-CM

## 2023-10-15 MED ORDER — ADALIMUMAB-RYVK 40 MG/0.4 ML SUBCUTANEOUS SYRINGE KIT
SUBCUTANEOUS | 3 refills | 0.00000 days | Status: CP
Start: 2023-10-15 — End: ?
  Filled 2023-10-20: qty 2, 28d supply, fill #0

## 2023-10-15 NOTE — Unmapped (Signed)
 [facility] RHEUMATOLOG[facility] CL[facility] - PHARMACIST NOTES    Received the following message from MAP team re: adalimumab  referral closure.    Medication: adalimumab -adaz   Reason for Closing: [employer] does not cover medication     MAP spoke with INS Rep Darryle @ [phone]   Medication has been changed and approved for biosimilar Simlandi  (adalimumab -ryvk) INS is handling case internally through [facility] program.   Approved through [facility] on 10/10/2023   Patient is authorized to receive 2 courtesy fills   Afterwards patient's medication is being handled internally by INS through [facility] and is approved to receive medication through 10/09/2024    Called and spoke with patient.  Updated him on status of medication approval.  Shared previous experience with [facility] and how it's a third party typically seeking mfg assistance to free insurance from financial responsibility of medication. Patient will contact his insurance to find out what the next step will be and advocate for coverage by insurance.     He will run out of medication this week and will need a refill of adalimumab , now Simlandi  per coverage information above. [facility] notified to contact patient to set up delivery.     Patient's insurance is now out of network with [facility] and he is looking to be referred elsewhere. He would like to go to Dr. Lady Blanch at [facility] in [place], [place].  Dr. Stacia notified to start referral process.     Latanya

## 2023-10-17 DIAGNOSIS — L409 Psoriasis, unspecified: Principal | ICD-10-CM

## 2023-10-17 DIAGNOSIS — L4050 Arthropathic psoriasis, unspecified: Principal | ICD-10-CM

## 2023-10-17 NOTE — Unmapped (Signed)
 [facility] Specialty Medication Onboarding    Specialty Medication: SIMLANDI (CF) 40 mg/0.4 mL Sykt (adalimumab -ryvk)  Prior Authorization: Approved   Financial Assistance: Yes - copay card approved as secondary   Final Copay/Day Supply: $0 / 28    Insurance Restrictions: Yes - max 1 month supply     Notes to Pharmacist:   Credit Card on File: not applicable  Start Date on Rx:  10/15/23    The triage team has completed the benefits investigation and has determined that the patient is able to fill this medication at [facility]. Please contact the patient to complete the onboarding or follow up with the prescribing physician as needed.

## 2023-10-17 NOTE — Unmapped (Signed)
 Patient transitioning from adalimumab -adaz to Simlandi .    [facility]    Patient Onboarding/Medication Counseling    Joshua Mccall is a 63 y.o. male with psoriatic arthritis who I am counseling today on initiation of therapy.  I am speaking to the patient.    Was a Nurse, learning disability used for this call? No    Verified patient's date of birth / HIPAA.    Specialty medication(s) to be sent: Inflammatory Disorders: Simlandi       Non-specialty medications/supplies to be sent: na      Medications not needed at this time: na           Simlandi  (adalimumab )    Medication & Administration     Dosage: Psoriatic arthritis: Inject 40mg  under the skin every 14 days    Lab tests required prior to treatment initiation:  Tuberculosis: Tuberculosis screening resulted in a non-reactive Quantiferon TB Gold assay.  Hepatitis B: Hepatitis B serology studies are complete and non-reactive.    Administration:     Prefilled syringe  1. Gather all supplies needed for injection on a clean, flat working surface: medication syringe(s) removed from packaging, alcohol swab, sharps container, etc.  2. Look at the medication label - look for correct medication, correct dose, and check the expiration date  3. Look at the medication - the liquid in the syringe should appear clear and colorless  4. Lay the syringe on a flat surface and allow it to warm up to room temperature for at least 15- 30 minutes  5. Select injection site - you can use the front of your thigh or your belly (but not the area 2 inches around your belly button)  6. Prepare injection site - wash your hands and clean the skin at the injection site with an alcohol swab and let it air dry, do not touch the injection site again before the injection  7. Pull off the needle safety cap, do not remove until immediately prior to injection; turn the syringe so the needle is facing up and hold the syringe at eye level with one hand so you can see the air in the syringe; using your other hand, slowly push the plunger in to push the air out through the needle  8. Pinch the skin - with your hand not holding the syringe pinch up a fold of skin at the injection site using your forefinger and thumb  9. Insert the needle into the fold of skin at about a 45 degree angle - it's best to use a quick dart-like motion  10.Push the plunger down slowly as far as it will go until the syringe is empty, hold the syringe in place for a full 5 seconds  11. Check that the syringe is empty and pull the needle out at the same angle as inserted  12. Dispose of the used syringe immediately in your sharps disposal container, do not attempt to recap the needle prior to disposing  13. If you see any blood at the injection site, press a cotton ball or gauze on the site and maintain pressure until the bleeding stops, do not rub the injection site    Adherence/Missed dose instructions:  If your injection is given more than 3 days after your scheduled injection date - consult your pharmacist for additional instructions on how to adjust your dosing schedule.    Goals of Therapy     - Achieve remission/inactive disease or low/minimal disease activity  - Maintenance of  function  - Minimization of systemic manifestations and comorbidities  - Maintenance of effective psychosocial functioning    Side Effects & Monitoring Parameters     Injection site reaction (redness, irritation, inflammation localized to the site of administration)  Signs of a common cold - minor sore throat, runny or stuffy nose, etc.  Upset stomach  Headache    The following side effects should be reported to the provider:  Signs of a hypersensitivity reaction - rash; hives; itching; red, swollen, blistered, or peeling skin; wheezing; tightness in the chest or throat; difficulty breathing, swallowing, or talking; swelling of the mouth, face, lips, tongue, or throat; etc.  Reduced immune function - report signs of infection such as fever; chills; body aches; very bad sore throat; ear or sinus pain; cough; more sputum or change in color of sputum; pain with passing urine; wound that will not heal, etc.  Also at a slightly higher risk of some malignancies (mainly skin and blood cancers) due to this reduced immune function.  In the case of signs of infection - the patient should hold the next dose of Humira ?? and call your primary care provider to ensure adequate medical care.  Treatment may be resumed when infection is treated and patient is asymptomatic.  Changes in skin - a new growth or lump that forms; changes in shape, size, or color of a previous mole or marking  Signs of unexplained bruising or bleeding - throwing up blood or emesis that looks like coffee grounds; black, tarry, or bloody stool; etc.  Signs of new or worsening heart failure - shortness of breath; sudden weight gain; heartbeat that is not normal; swelling in the arms or legs that is new or worse      Contraindications, Warnings, & Precautions     Have your bloodwork checked as you have been told by your prescriber  Talk with your doctor if you are pregnant, planning to become pregnant, or breastfeeding  Discuss the possible need for holding your dose(s) of Humira ?? when a planned procedure is scheduled with the prescriber as it may delay healing/recovery timeline       Drug/Food Interactions     Medication list reviewed in Epic. The patient was instructed to inform the care team before taking any new medications or supplements. No drug interactions identified.   Talk with you prescriber or pharmacist before receiving any live vaccinations while taking this medication and after you stop taking it    Storage, Handling Precautions, & Disposal     Store this medication in the refrigerator.  Do not freeze  If needed, you may store at room temperature for up to 30 days  Store in original packaging, protected from light  Do not shake  Dispose of used syringes/pens in a sharps disposal container            Current Medications (including OTC/herbals), Comorbidities and Allergies     Current Medications[1]    Allergies[2]    Problem List[3]    Medication list has been reviewed and updated in Epic: Yes    Allergies have been reviewed and updated in Epic: Yes    Appropriateness of Therapy     Acute infections noted within Epic:  No active infections  Patient reported infection: None    Is the medication and dose appropriate based on diagnosis, medication list, comorbidities, allergies, medical history, patient???s ability to self-administer the medication, and therapeutic goals? Yes    Prescription has been clinically reviewed: Yes  Baseline Quality of Life Assessment      How many days over the past month did your psa  keep you from your normal activities? For example, brushing your teeth or getting up in the morning. Patient declined to answer    Financial Information     Medication Assistance provided: Prior Authorization    Anticipated copay of $0 reviewed with patient. Verified delivery address.    Delivery Information     Scheduled delivery date: 7/28    Expected start date: 7/28      Medication will be delivered via Same Day Courier to the prescription address in [facility].  This shipment will not require a signature.      Explained the services we provide at [facility] and that each month we would call to set up refills.  Stressed importance of returning phone calls so that we could ensure they receive their medications in time each month.  Informed patient that we should be setting up refills 7-10 days prior to when they will run out of medication.  A pharmacist will reach out to perform a clinical assessment periodically.  Informed patient that a welcome packet, containing information about our pharmacy and other support services, a Notice of Privacy Practices, and a drug information handout will be sent.      The patient or caregiver noted above participated in the development of this care plan and knows that they can request review of or adjustments to the care plan at any time.      Patient or caregiver verbalized understanding of the above information as well as how to contact the pharmacy at [phone] option 4 with any questions/concerns.  The pharmacy is open Monday through Friday 8:30am-4:30pm.  A pharmacist is available 24/7 via pager to answer any clinical questions they may have.    Patient Specific Needs     Does the patient have any physical, cognitive, or cultural barriers? No    Does the patient have adequate living arrangements? (i.e. the ability to store and take their medication appropriately) Yes    Did you identify any home environmental safety or security hazards? No    Patient prefers to have medications discussed with  Patient     Is the patient or caregiver able to read and understand education materials at a high school level or above? Yes    Patient's primary language is  English     Is the patient high risk? No    Does the patient have an additional or emergency contact listed in their chart? Yes    SOCIAL DETERMINANTS OF HEALTH     At the [facility], we have learned that life circumstances - like trouble affording food, housing, utilities, or transportation can affect the health of many of our patients.   That is why we wanted to ask: are you currently experiencing any life circumstances that are negatively impacting your health and/or quality of life? Patient declined to answer    Social Drivers of Health     Food Insecurity: No Food Insecurity (05/30/2023)    Received from [facility]    Hunger Vital Sign     Worried About Running Out of Food in the Last Year: Never true     Ran Out of Food in the Last Year: Never true   Tobacco Use: Low Risk  (07/16/2023)    Patient History     Smoking Tobacco Use: Never  Smokeless Tobacco Use: Never     Passive Exposure: Not on file   Recent Concern: Tobacco Use - Medium Risk (07/02/2023)    Received from [facility]    Patient History     Smoking Tobacco Use: Never     Smokeless Tobacco Use: Former     Passive Exposure: Not on file   Transportation Needs: No Transportation Needs (05/30/2023)    Received from [facility] - Transportation     In the past 12 months, has lack of transportation kept you from medical appointments or from getting medications?: No     Lack of Transportation (Non-Medical): No   Alcohol Use: Not on file   Housing: Unknown (05/30/2023)    Received from [facility]    Housing Stability Vital Sign     Unable to Pay for Housing in the Last Year: No     Number of Times Moved in the Last Year: Not on file     Homeless in the Last Year: No   Physical Activity: Not on file   Utilities: Not At Risk (05/30/2023)    Received from [facility] Utilities     Threatened with loss of utilities: No   Stress: Not on file   Interpersonal Safety: Not At Risk (07/16/2023)    Interpersonal Safety     Unsafe Where You Currently Live: No     Physically Hurt by Anyone: No     Abused by Anyone: No   Substance Use: Not on file (02/03/2023)   Intimate Partner Violence: Not At Risk (07/16/2023)    Humiliation, Afraid, Rape, and Kick questionnaire     Fear of Current or Ex-Partner: No     Emotionally Abused: No     Physically Abused: No     Sexually Abused: No   Social Connections: Not on file   Financial Resource Strain: Low Risk  (05/30/2023)    Received from [facility]    Overall Financial Resource Strain (CARDIA)     Difficulty of Paying Living Expenses: Not hard at all   Health Literacy: Not on file   Internet Connectivity: Not on file       Would you be willing to receive help with any of the needs that you have identified today? Not applicable       [facility] Specialty Pharmacist         [1]   Current Outpatient Medications   Medication Sig Dispense Refill    adalimumab -ryvk 40 mg/0.4 mL SyKt Inject the contents of 1 syringe (40 mg) under the skin every fourteen (14) days. 6 each 3    aspirin (ECOTRIN) 81 MG tablet Take 1 tablet (81 mg total) by mouth.      betamethasone, augmented, (DIPROLENE) 0.05 % cream       calcipotriene (DOVONOX) 0.005 % ointment       cholecalciferol, vitamin D3-50 mcg, 2,000 unit,, 50 mcg (2,000 unit) cap Take 1 capsule (50 mcg total) by mouth.      desonide (DESOWEN) 0.05 % cream       empty container (SHARPS-A-GATOR DISPOSAL SYSTEM) Misc Use as directed 1 each 2    empty container Misc Use as directed 1 each 2    hydrocortisone 2.5 % cream Insert into the rectum.      meloxicam  (MOBIC ) 15 MG tablet Take 1  tablet (15 mg total) by mouth daily. 30 tablet 2    multivitamin (TAB-A-VITE/THERAGRAN) per tablet Take 1 tablet by mouth.      omega 3-dha-epa-fish oil (FISH OIL) 100-160-1,000 mg cap Take 2 capsules by mouth.      PAXLOVID  CO-PACK, EUA, (PAXLOVID  CO-PACK, EUA,) 300 mg (150 mg x 2)-100 mg tablet See package instructions. 30 tablet 0    telmisartan (MICARDIS) 40 MG tablet Take 1 tablet (40 mg total) by mouth daily.      vitamin E-268 mg, 400 UNIT, 268 mg (400 UNIT) capsule Take 1 capsule (268 mg total) by mouth.       No current facility-administered medications for this visit.   [2]   Allergies  Allergen Reactions    Mercury     Penicillins    [3]   Patient Active Problem List  Diagnosis    Psoriasis    Psoriatic arthritis

## 2023-10-17 NOTE — Unmapped (Signed)
 Onboarding will be needed for Simlandi 

## 2023-11-04 NOTE — Unmapped (Signed)
 [facility] Clinical Assessment & Refill Coordination Note    -Patient had a big knot after 1st injection.  He will continue to monitor    Joshua Mccall, DOB: 05-27-1960  Phone: [phone] (home)     All above HIPAA information was verified with patient.     Was a Nurse, learning disability used for this call? No    Specialty Medication(s):   Inflammatory Disorders: Simlandi      Current Medications[1]     Changes to medications: Braydn reports no changes at this time.    Medication list has been reviewed and updated in Epic: Yes    Allergies[2]    Changes to allergies: No    Allergies have been reviewed and updated in Epic: Yes    SPECIALTY MEDICATION ADHERENCE       Medication Adherence    Patient reported X missed doses in the last month: 0  Specialty Medication: Simlandi   Patient is on additional specialty medications: No  Informant: patient          Specialty medication(s) dose(s) confirmed: Regimen is correct and unchanged.     Are there any concerns with adherence? No    Adherence counseling provided? Not needed    CLINICAL MANAGEMENT AND INTERVENTION      Clinical Benefit Assessment:    Do you feel the medicine is effective or helping your condition? Yes    Clinical Benefit counseling provided? Progress note from 07/16/23 shows evidence of clinical benefit    Adverse Effects Assessment:    Are you experiencing any side effects? No    Are you experiencing difficulty administering your medicine? No    Quality of Life Assessment:    Quality of Life    Rheumatology  Oncology  Dermatology  Cystic Fibrosis          How many days over the past month did your PsA  keep you from your normal activities? For example, brushing your teeth or getting up in the morning. Patient declined to answer    Have you discussed this with your provider? Not needed    Acute Infection Status:    Acute infections noted within Epic:  No active infections    Patient reported infection: None    Therapy Appropriateness:    Is therapy appropriate based on current medication list, adverse reactions, adherence, clinical benefit and progress toward achieving therapeutic goals? Yes, therapy is appropriate and should be continued     Clinical Intervention:    Was an intervention completed as part of this clinical assessment? No    DISEASE/MEDICATION-SPECIFIC INFORMATION      For patients on injectable medications: Patient currently has 0 doses left.  Next injection is scheduled for 8/24.    Chronic Inflammatory Diseases: Have you experienced any flares in the last month? No    PATIENT SPECIFIC NEEDS     Does the patient have any physical, cognitive, or cultural barriers? No    Is the patient high risk? No    Does the patient require physician intervention or other additional services (i.e., nutrition, smoking cessation, social work)? No    Does the patient have an additional or emergency contact listed in their chart? Yes    SOCIAL DETERMINANTS OF HEALTH     At the [facility], we have learned that life circumstances - like trouble affording food, housing, utilities, or transportation can affect the health of many of our patients.   That is why we wanted to ask:  are you currently experiencing any life circumstances that are negatively impacting your health and/or quality of life? No    Social Drivers of Health     Food Insecurity: No Food Insecurity (05/30/2023)    Received from [facility]    Hunger Vital Sign     Worried About Running Out of Food in the Last Year: Never true     Ran Out of Food in the Last Year: Never true   Tobacco Use: Low Risk  (07/16/2023)    Patient History     Smoking Tobacco Use: Never     Smokeless Tobacco Use: Never     Passive Exposure: Not on file   Recent Concern: Tobacco Use - Medium Risk (07/02/2023)    Received from [facility]    Patient History     Smoking Tobacco Use: Never     Smokeless Tobacco Use: Former     Passive Exposure: Not on file   Transportation Needs: No Transportation Needs (05/30/2023)    Received from [facility] - Transportation     In the past 12 months, has lack of transportation kept you from medical appointments or from getting medications?: No     Lack of Transportation (Non-Medical): No   Alcohol Use: Not on file   Housing: Unknown (05/30/2023)    Received from [facility]    Housing Stability Vital Sign     Unable to Pay for Housing in the Last Year: No     Number of Times Moved in the Last Year: Not on file     Homeless in the Last Year: No   Physical Activity: Not on file   Utilities: Not At Risk (05/30/2023)    Received from [facility] Utilities     Threatened with loss of utilities: No   Stress: Not on file   Interpersonal Safety: Not At Risk (07/16/2023)    Interpersonal Safety     Unsafe Where You Currently Live: No     Physically Hurt by Anyone: No     Abused by Anyone: No   Substance Use: Not on file (02/03/2023)   Intimate Partner Violence: Not At Risk (07/16/2023)    Humiliation, Afraid, Rape, and Kick questionnaire     Fear of Current or Ex-Partner: No     Emotionally Abused: No     Physically Abused: No     Sexually Abused: No   Social Connections: Not on file   Financial Resource Strain: Low Risk  (05/30/2023)    Received from [facility]    Overall Financial Resource Strain (CARDIA)     Difficulty of Paying Living Expenses: Not hard at all   Health Literacy: Not on file   Internet Connectivity: Not on file       Would you be willing to receive help with any of the needs that you have identified today? Not applicable       SHIPPING     Specialty Medication(s) to be Shipped:   Inflammatory Disorders: Simlandi     Other medication(s) to be shipped: No additional medications requested for fill at this time    Specialty Medications not needed at this time: N/A     Changes to insurance: No    Cost and Payment: Too soon to test    Delivery Scheduled: Yes, Expected medication delivery date: 8/19.     Medication will be  delivered via Same Day Courier to the confirmed prescription address in [facility].    The patient will receive a drug information handout for each medication shipped and additional FDA Medication Guides as required.  Verified that patient has previously received a Conservation officer, historic buildings and a Surveyor, mining.    The patient or caregiver noted above participated in the development of this care plan and knows that they can request review of or adjustments to the care plan at any time.      All of the patient's questions and concerns have been addressed.    Rosalynn GORMAN Kin, PharmD   [facility] Specialty Pharmacist       [1]   Current Outpatient Medications   Medication Sig Dispense Refill    adalimumab -ryvk 40 mg/0.4 mL SyKt Inject the contents of 1 syringe (40 mg) under the skin every fourteen (14) days. 6 each 3    aspirin (ECOTRIN) 81 MG tablet Take 1 tablet (81 mg total) by mouth.      betamethasone, augmented, (DIPROLENE) 0.05 % cream       calcipotriene (DOVONOX) 0.005 % ointment       cholecalciferol, vitamin D3-50 mcg, 2,000 unit,, 50 mcg (2,000 unit) cap Take 1 capsule (50 mcg total) by mouth.      desonide (DESOWEN) 0.05 % cream       empty container (Mccall-A-GATOR DISPOSAL SYSTEM) Misc Use as directed 1 each 2    empty container Misc Use as directed 1 each 2    hydrocortisone 2.5 % cream Insert into the rectum.      multivitamin (TAB-A-VITE/THERAGRAN) per tablet Take 1 tablet by mouth.      omega 3-dha-epa-fish oil (FISH OIL) 100-160-1,000 mg cap Take 2 capsules by mouth.      PAXLOVID  CO-PACK, EUA, (PAXLOVID  CO-PACK, EUA,) 300 mg (150 mg x 2)-100 mg tablet See package instructions. 30 tablet 0    telmisartan (MICARDIS) 40 MG tablet Take 1 tablet (40 mg total) by mouth daily.      vitamin E-268 mg, 400 UNIT, 268 mg (400 UNIT) capsule Take 1 capsule (268 mg total) by mouth.       No current facility-administered medications for this visit.   [2]   Allergies  Allergen Reactions    Mercury     Penicillins

## 2023-11-11 MED FILL — SIMLANDI(CF) 40 MG/0.4 ML SUBCUTANEOUS SYRINGE KIT: SUBCUTANEOUS | 28 days supply | Qty: 2 | Fill #1

## 2023-12-02 NOTE — Unmapped (Signed)
 [facility] Refill Coordination Note    Specialty Medication(s) to be Shipped:   Inflammatory Disorders: Simlandi     Other medication(s) to be shipped: No additional medications requested for fill at this time    Specialty Medications not needed at this time: N/A     Joshua Mccall, DOB: 05/02/1960  Phone: [phone] (home)       All above HIPAA information was verified with patient.     Was a Nurse, learning disability used for this call? No    Completed refill call assessment today to schedule patient's medication shipment from the [facility]  [phone]).  All relevant notes have been reviewed.     Specialty medication(s) and dose(s) confirmed: Regimen is correct and unchanged.   Changes to medications: Allex reports no changes at this time.  Changes to insurance: No  New side effects reported not previously addressed with a pharmacist or physician: None reported  Questions for the pharmacist: No    Confirmed patient received a Conservation officer, historic buildings and a Surveyor, mining with first shipment. The patient will receive a drug information handout for each medication shipped and additional FDA Medication Guides as required.       DISEASE/MEDICATION-SPECIFIC INFORMATION        For patients on injectable medications: Next injection is scheduled for 9.14.25.    SPECIALTY MEDICATION ADHERENCE     Medication Adherence    Specialty Medication: SIMLANDI (CF) 40 mg/0.4 mL Sykt (adalimumab -ryvk)  Patient is on additional specialty medications: No              Were doses missed due to medication being on hold? No      SIMLANDI (CF) 40 mg/0.4 mL Sykt (adalimumab -ryvk): 0 doses of medicine on hand       REFERRAL TO PHARMACIST     Referral to the pharmacist: Not needed      [facility]     Shipping address confirmed in Epic.     Cost and Payment: Patient has a $0 copay, payment information is not required.    Delivery Scheduled: Yes, Expected medication delivery date: 9.15.25. Medication will be delivered via Same Day Courier to the prescription address in Epic [facility].    [name]   [facility]  Specialty Technician

## 2023-12-08 MED FILL — SIMLANDI(CF) 40 MG/0.4 ML SUBCUTANEOUS SYRINGE KIT: SUBCUTANEOUS | 28 days supply | Qty: 2 | Fill #2

## 2023-12-09 MED ORDER — ADALIMUMAB-RYVK 40 MG/0.4 ML SUBCUTANEOUS SYRINGE KIT
5 refills | 0.00000 days
Start: 2023-12-09 — End: ?

## 2023-12-29 NOTE — Unmapped (Signed)
 [facility] Specialty and Home Delivery Pharmacy Refill Coordination Note    Specialty Medication(s) to be Shipped:   Inflammatory Disorders: adalimumab -ryvk 40 mg/0.4 mL Sykt    Other medication(s) to be shipped: No additional medications requested for fill at this time    Specialty Medications not needed at this time: N/A     Joshua Mccall, DOB: December 19, 1960  Phone: [phone] (home)       All above HIPAA information was verified with patient.     Was a Nurse, learning disability used for this call? No    Completed refill call assessment today to schedule patient's medication shipment from the [facility] and Home Delivery Pharmacy  [phone]).  All relevant notes have been reviewed.     Specialty medication(s) and dose(s) confirmed: Regimen is correct and unchanged.   Changes to medications: Fabion reports no changes at this time.  Changes to insurance: No  New side effects reported not previously addressed with a pharmacist or physician: None reported  Questions for the pharmacist: No    Confirmed patient received a Conservation officer, historic buildings and a Surveyor, mining with first shipment. The patient will receive a drug information handout for each medication shipped and additional FDA Medication Guides as required.       DISEASE/MEDICATION-SPECIFIC INFORMATION        For patients on injectable medications: Next injection is scheduled for 10.12.25.    SPECIALTY MEDICATION ADHERENCE     Medication Adherence    Patient reported X missed doses in the last month: 0  Specialty Medication: adalimumab -ryvk 40 mg/0.4 mL Sykt  Patient is on additional specialty medications: No              Were doses missed due to medication being on hold? No      adalimumab -ryvk 40 mg/0.4 mL Sykt: 0 doses of medicine on hand       REFERRAL TO PHARMACIST     Referral to the pharmacist: Not needed      [facility]     Shipping address confirmed in Epic.     Cost and Payment: Patient has a $0 copay, payment information is not required.    Delivery Scheduled: Yes, Expected medication delivery date: 10.9.25.     Medication will be delivered via Same Day Courier to the prescription address in Epic [place].    [name]   [facility] Specialty and Home Delivery Pharmacy  Specialty Technician

## 2024-01-01 MED FILL — SIMLANDI(CF) 40 MG/0.4 ML SUBCUTANEOUS SYRINGE KIT: SUBCUTANEOUS | 28 days supply | Qty: 2 | Fill #0

## 2024-01-22 NOTE — Progress Notes (Signed)
 [facility] Specialty and Home Delivery Pharmacy Refill Coordination Note    Specialty Medication(s) to be Shipped:   Inflammatory Disorders: SIMLANDI (CF) 40 mg/0.4 mL Sykt (adalimumab -ryvk)    Other medication(s) to be shipped: No additional medications requested for fill at this time    Specialty Medications not needed at this time: N/A     Joshua Mccall, DOB: [birth year]  Phone: [phone] (home)       All above HIPAA information was verified with patient.     Was a nurse, learning disability used for this call? No    Completed refill call assessment today to schedule patient's medication shipment from the [facility] and Home Delivery Pharmacy  [phone]).  All relevant notes have been reviewed.     Specialty medication(s) and dose(s) confirmed: Regimen is correct and unchanged.   Changes to medications: [name] reports no changes at this time.  Changes to insurance: No  New side effects reported not previously addressed with a pharmacist or physician: None reported  Questions for the pharmacist: No    Confirmed patient received a Conservation Officer, Historic Buildings and a Surveyor, Mining with first shipment. The patient will receive a drug information handout for each medication shipped and additional FDA Medication Guides as required.       DISEASE/MEDICATION-SPECIFIC INFORMATION        For patients on injectable medications: Next injection is scheduled for 11.9.25.    SPECIALTY MEDICATION ADHERENCE     Medication Adherence    Patient reported X missed doses in the last month: 0  Specialty Medication: SIMLANDI (CF) 40 mg/0.4 mL Sykt (adalimumab -ryvk)  Patient is on additional specialty medications: No              Were doses missed due to medication being on hold? No      SIMLANDI (CF) 40 mg/0.4 mL Sykt (adalimumab -ryvk): 0 doses of medicine on hand       REFERRAL TO PHARMACIST     Referral to the pharmacist: Not needed      [facility]     Shipping address confirmed in Epic.     Cost and Payment: Patient has a $0 copay, payment information is not required.    Delivery Scheduled: Yes, Expected medication delivery date: 11.6.25.     Medication will be delivered via Same Day Courier to the prescription address in Epic [place].    [name]   [facility] Specialty and Home Delivery Pharmacy  Specialty Technician

## 2024-01-29 MED FILL — SIMLANDI(CF) 40 MG/0.4 ML SUBCUTANEOUS SYRINGE KIT: SUBCUTANEOUS | 28 days supply | Qty: 2 | Fill #1

## 2024-02-17 NOTE — Progress Notes (Signed)
 [facility] Specialty and Home Delivery Pharmacy Refill Coordination Note    Specialty Medication(s) to be Shipped:   Inflammatory Disorders: Simlandi     Other medication(s) to be shipped: No additional medications requested for fill at this time    Specialty Medications not needed at this time: N/A     Joshua Mccall, DOB: 12/22/1960  Phone: [phone] (home)       All above HIPAA information was verified with patient.     Was a nurse, learning disability used for this call? No    Completed refill call assessment today to schedule patient's medication shipment from the [facility] and Home Delivery Pharmacy  [phone]).  All relevant notes have been reviewed.     Specialty medication(s) and dose(s) confirmed: Regimen is correct and unchanged.   Changes to medications: Joshua Mccall reports no changes at this time.  Changes to insurance: No  New side effects reported not previously addressed with a pharmacist or physician: None reported  Questions for the pharmacist: No    Confirmed patient received a Conservation Officer, Historic Buildings and a Surveyor, Mining with first shipment. The patient will receive a drug information handout for each medication shipped and additional FDA Medication Guides as required.       DISEASE/MEDICATION-SPECIFIC INFORMATION        For patients on injectable medications: Next injection is scheduled for 12.7..25.    SPECIALTY MEDICATION ADHERENCE     Medication Adherence    Patient reported X missed doses in the last month: 0  Specialty Medication: SIMLANDI (CF) 40 mg/0.4 mL Sykt (adalimumab -ryvk)  Patient is on additional specialty medications: No              Were doses missed due to medication being on hold? No      SIMLANDI (CF) 40 mg/0.4 mL Sykt (adalimumab -ryvk): 0 doses of medicine on hand       REFERRAL TO PHARMACIST     Referral to the pharmacist: Not needed      Joshua Mccall     Shipping address confirmed in Epic.     Cost and Payment: Patient has a $0 copay, payment information is not required.    Delivery Scheduled: Yes, Expected medication delivery date: 12.4.25.     Medication will be delivered via Same Day Courier to the prescription address in Epic [place].    Joshua Mccall   [facility] Specialty and Home Delivery Pharmacy  Specialty Technician

## 2024-02-26 MED FILL — SIMLANDI(CF) 40 MG/0.4 ML SUBCUTANEOUS SYRINGE KIT: SUBCUTANEOUS | 28 days supply | Qty: 2 | Fill #2

## 2024-03-12 NOTE — Progress Notes (Signed)
 [facility] Specialty and Home Delivery Pharmacy Refill Coordination Note    Specialty Medication(s) to be Shipped:   Inflammatory Disorders: Simlandi     Other medication(s) to be shipped: No additional medications requested for fill at this time    Specialty Medications not needed at this time: N/A     Joshua Mccall, DOB: 01-22-1961  Phone: [phone] (home)       All above HIPAA information was verified with patient.     Was a nurse, learning disability used for this call? No    Completed refill call assessment today to schedule patient's medication shipment from the [facility] and Home Delivery Pharmacy  [phone]).  All relevant notes have been reviewed.     Specialty medication(s) and dose(s) confirmed: Regimen is correct and unchanged.   Changes to medications: Joshua Mccall reports no changes at this time.  Changes to insurance: No  New side effects reported not previously addressed with a pharmacist or physician: None reported  Questions for the pharmacist: No    Confirmed patient received a Conservation Officer, Historic Buildings and a Surveyor, Mining with first shipment. The patient will receive a drug information handout for each medication shipped and additional FDA Medication Guides as required.       DISEASE/MEDICATION-SPECIFIC INFORMATION        N/A    SPECIALTY MEDICATION ADHERENCE     Medication Adherence    Patient reported X missed doses in the last month: 0  Specialty Medication: adalimumab -ryvk: SIMLANDI (CF) 40 mg/0.4 mL Sykt  Patient is on additional specialty medications: No              Were doses missed due to medication being on hold? No      adalimumab -ryvk: SIMLANDI (CF) 40 mg/0.4 mL Sykt: 0 doses of medicine on hand       Specialty medication is an injection or given on a cycle: Yes, Next injection is scheduled for 12.28.25.    REFERRAL TO PHARMACIST     Referral to the pharmacist: Not needed      [facility]     Shipping address confirmed in Epic.     Cost and Payment: Patient has a $0 copay, payment information is not required.    Delivery Scheduled: Yes, Expected medication delivery date: 12.30.25.     Medication will be delivered via Next Day Courier to the prescription address in Epic W[facility].    Joshua Mccall   [facility] Specialty and Home Delivery Pharmacy  Specialty Technician

## 2024-03-22 MED FILL — SIMLANDI(CF) 40 MG/0.4 ML SUBCUTANEOUS SYRINGE KIT: SUBCUTANEOUS | 28 days supply | Qty: 2 | Fill #3

## 2024-04-13 NOTE — Progress Notes (Signed)
 [facility] Specialty and Home Delivery Pharmacy Refill Coordination Note    Specialty Medication(s) to be Shipped:   Inflammatory Disorders: Simlandi     Other medication(s) to be shipped: No additional medications requested for fill at this time    Specialty Medications not needed at this time: N/A     Joshua Mccall, DOB: 03/11/1961  Phone: [phone] (home)       All above HIPAA information was verified with patient.     Was a nurse, learning disability used for this call? No    Completed refill call assessment today to schedule patient's medication shipment from the [facility] and Home Delivery Pharmacy  [phone]).  All relevant notes have been reviewed.     Specialty medication(s) and dose(s) confirmed: Regimen is correct and unchanged.   Changes to medications: [name] reports no changes at this time.  Changes to insurance: No  New side effects reported not previously addressed with a pharmacist or physician: None reported  Questions for the pharmacist: No    Confirmed patient received a Conservation Officer, Historic Buildings and a Surveyor, Mining with first shipment. The patient will receive a drug information handout for each medication shipped and additional FDA Medication Guides as required.       DISEASE/MEDICATION-SPECIFIC INFORMATION        N/A    SPECIALTY MEDICATION ADHERENCE     Medication Adherence    Patient reported X missed doses in the last month: 0  Specialty Medication: adalimumab -ryvk: SIMLANDI (CF) 40 mg/0.4 mL Sykt  Patient is on additional specialty medications: No              Were doses missed due to medication being on hold? No    Simlandi  40/0.4 mg/ml: 1 doses of medicine on hand        Specialty medication is an injection or given on a cycle: Yes, Next injection is scheduled for 1/25.    REFERRAL TO PHARMACIST     Referral to the pharmacist: Not needed      [facility]     Shipping address confirmed in Epic.     Cost and Payment: Patient has a $0 copay, payment information is not required.    Delivery Scheduled: Yes, Expected medication delivery date: 04/22/24.     Medication will be delivered via Next Day Courier to the prescription address in Epic [place].    Joshua Mccall   [facility] Specialty and Home Delivery Pharmacy  Specialty Technician

## 2024-04-21 MED FILL — SIMLANDI(CF) 40 MG/0.4 ML SUBCUTANEOUS SYRINGE KIT: SUBCUTANEOUS | 28 days supply | Qty: 2 | Fill #4
